# Patient Record
Sex: Male | Born: 1950 | Race: White | Hispanic: No | Marital: Married | Smoking: Never smoker
Health system: Southern US, Community
[De-identification: ages and names within clinical notes are randomized; demographics above are authoritative.]

## PROBLEM LIST (undated history)

## (undated) DIAGNOSIS — R2242 Localized swelling, mass and lump, left lower limb: Secondary | ICD-10-CM

## (undated) DIAGNOSIS — N529 Male erectile dysfunction, unspecified: Secondary | ICD-10-CM

## (undated) DIAGNOSIS — Z87442 Personal history of urinary calculi: Secondary | ICD-10-CM

## (undated) DIAGNOSIS — I82409 Acute embolism and thrombosis of unspecified deep veins of unspecified lower extremity: Secondary | ICD-10-CM

## (undated) DIAGNOSIS — J302 Other seasonal allergic rhinitis: Secondary | ICD-10-CM

## (undated) DIAGNOSIS — T4145XA Adverse effect of unspecified anesthetic, initial encounter: Secondary | ICD-10-CM

## (undated) DIAGNOSIS — M1990 Unspecified osteoarthritis, unspecified site: Secondary | ICD-10-CM

## (undated) DIAGNOSIS — J189 Pneumonia, unspecified organism: Secondary | ICD-10-CM

## (undated) DIAGNOSIS — Z9889 Other specified postprocedural states: Secondary | ICD-10-CM

## (undated) DIAGNOSIS — Z860100 Personal history of colon polyps, unspecified: Secondary | ICD-10-CM

## (undated) DIAGNOSIS — T8859XA Other complications of anesthesia, initial encounter: Secondary | ICD-10-CM

## (undated) DIAGNOSIS — K648 Other hemorrhoids: Secondary | ICD-10-CM

## (undated) DIAGNOSIS — D126 Benign neoplasm of colon, unspecified: Secondary | ICD-10-CM

## (undated) DIAGNOSIS — R112 Nausea with vomiting, unspecified: Secondary | ICD-10-CM

## (undated) DIAGNOSIS — N400 Enlarged prostate without lower urinary tract symptoms: Secondary | ICD-10-CM

## (undated) HISTORY — DX: Male erectile dysfunction, unspecified: N52.9

## (undated) HISTORY — DX: Personal history of colonic polyps: Z86.010

## (undated) HISTORY — DX: Benign neoplasm of colon, unspecified: D12.6

## (undated) HISTORY — PX: EXCISIONAL HEMORRHOIDECTOMY: SHX1541

## (undated) HISTORY — PX: JOINT REPLACEMENT: SHX530

## (undated) HISTORY — PX: KIDNEY STONE SURGERY: SHX686

## (undated) HISTORY — PX: SHOULDER ARTHROSCOPY: SHX128

## (undated) HISTORY — DX: Personal history of colon polyps, unspecified: Z86.0100

## (undated) HISTORY — PX: KNEE ARTHROSCOPY: SUR90

## (undated) HISTORY — DX: Other hemorrhoids: K64.8

## (undated) HISTORY — PX: COLONOSCOPY: SHX174

## (undated) HISTORY — DX: Benign prostatic hyperplasia without lower urinary tract symptoms: N40.0

---

## 2018-03-08 ENCOUNTER — Other Ambulatory Visit (HOSPITAL_COMMUNITY): Payer: Self-pay | Admitting: Orthopedic Surgery

## 2018-03-08 ENCOUNTER — Other Ambulatory Visit: Payer: Self-pay

## 2018-03-08 ENCOUNTER — Encounter (HOSPITAL_BASED_OUTPATIENT_CLINIC_OR_DEPARTMENT_OTHER): Payer: Self-pay | Admitting: *Deleted

## 2018-03-08 NOTE — Progress Notes (Signed)
Patient takes Xarelto for hx bilateral DVT's. States his LD was today 03-08-18 for upcoming surgery on 03-11-18 with Dr Damien Fusi.

## 2018-03-11 ENCOUNTER — Ambulatory Visit (HOSPITAL_BASED_OUTPATIENT_CLINIC_OR_DEPARTMENT_OTHER)
Admission: RE | Admit: 2018-03-11 | Discharge: 2018-03-11 | Disposition: A | Discharge status: Home / Self Care | Payer: Medicare Other | Source: Ambulatory Visit | Attending: Orthopedic Surgery | Admitting: Orthopedic Surgery

## 2018-03-11 ENCOUNTER — Encounter (HOSPITAL_BASED_OUTPATIENT_CLINIC_OR_DEPARTMENT_OTHER)
Admission: RE | Disposition: A | Discharge status: Home / Self Care | Payer: Self-pay | Source: Ambulatory Visit | Attending: Orthopedic Surgery

## 2018-03-11 ENCOUNTER — Other Ambulatory Visit: Payer: Self-pay

## 2018-03-11 ENCOUNTER — Ambulatory Visit (HOSPITAL_BASED_OUTPATIENT_CLINIC_OR_DEPARTMENT_OTHER): Payer: Medicare Other | Admitting: Certified Registered"

## 2018-03-11 ENCOUNTER — Encounter (HOSPITAL_BASED_OUTPATIENT_CLINIC_OR_DEPARTMENT_OTHER): Payer: Self-pay | Admitting: *Deleted

## 2018-03-11 DIAGNOSIS — D759 Disease of blood and blood-forming organs, unspecified: Secondary | ICD-10-CM | POA: Diagnosis not present

## 2018-03-11 DIAGNOSIS — R2242 Localized swelling, mass and lump, left lower limb: Secondary | ICD-10-CM | POA: Diagnosis present

## 2018-03-11 DIAGNOSIS — M7989 Other specified soft tissue disorders: Secondary | ICD-10-CM

## 2018-03-11 DIAGNOSIS — Z96652 Presence of left artificial knee joint: Secondary | ICD-10-CM | POA: Diagnosis not present

## 2018-03-11 DIAGNOSIS — L905 Scar conditions and fibrosis of skin: Secondary | ICD-10-CM | POA: Insufficient documentation

## 2018-03-11 DIAGNOSIS — Z86718 Personal history of other venous thrombosis and embolism: Secondary | ICD-10-CM | POA: Diagnosis not present

## 2018-03-11 DIAGNOSIS — Z7901 Long term (current) use of anticoagulants: Secondary | ICD-10-CM | POA: Diagnosis not present

## 2018-03-11 DIAGNOSIS — M1990 Unspecified osteoarthritis, unspecified site: Secondary | ICD-10-CM | POA: Diagnosis not present

## 2018-03-11 DIAGNOSIS — Z87442 Personal history of urinary calculi: Secondary | ICD-10-CM | POA: Diagnosis not present

## 2018-03-11 DIAGNOSIS — L570 Actinic keratosis: Secondary | ICD-10-CM | POA: Diagnosis not present

## 2018-03-11 DIAGNOSIS — Z79899 Other long term (current) drug therapy: Secondary | ICD-10-CM | POA: Insufficient documentation

## 2018-03-11 HISTORY — DX: Other complications of anesthesia, initial encounter: T88.59XA

## 2018-03-11 HISTORY — PX: MASS EXCISION: SHX2000

## 2018-03-11 HISTORY — DX: Unspecified osteoarthritis, unspecified site: M19.90

## 2018-03-11 HISTORY — DX: Other seasonal allergic rhinitis: J30.2

## 2018-03-11 HISTORY — DX: Nausea with vomiting, unspecified: R11.2

## 2018-03-11 HISTORY — DX: Other specified postprocedural states: Z98.890

## 2018-03-11 HISTORY — DX: Localized swelling, mass and lump, left lower limb: R22.42

## 2018-03-11 HISTORY — DX: Personal history of urinary calculi: Z87.442

## 2018-03-11 HISTORY — DX: Adverse effect of unspecified anesthetic, initial encounter: T41.45XA

## 2018-03-11 HISTORY — DX: Acute embolism and thrombosis of unspecified deep veins of unspecified lower extremity: I82.409

## 2018-03-11 SURGERY — EXCISION MASS
Anesthesia: Monitor Anesthesia Care | Site: Foot | Laterality: Left

## 2018-03-11 MED ORDER — ONDANSETRON HCL 4 MG/2ML IJ SOLN
INTRAMUSCULAR | Status: AC
  Filled 2018-03-11: qty 16

## 2018-03-11 MED ORDER — MIDAZOLAM HCL 2 MG/2ML IJ SOLN: 1.0000 mg | INTRAMUSCULAR | Status: DC | PRN

## 2018-03-11 MED ORDER — EPHEDRINE 5 MG/ML INJ
INTRAVENOUS | Status: AC
  Filled 2018-03-11: qty 10

## 2018-03-11 MED ORDER — PROPOFOL 10 MG/ML IV BOLUS
INTRAVENOUS | Status: AC
  Filled 2018-03-11: qty 20

## 2018-03-11 MED ORDER — FENTANYL CITRATE (PF) 100 MCG/2ML IJ SOLN
INTRAMUSCULAR | Status: AC
  Filled 2018-03-11: qty 2

## 2018-03-11 MED ORDER — CHLORHEXIDINE GLUCONATE 4 % EX LIQD: 60.0000 mL | Freq: Once | CUTANEOUS | Status: DC

## 2018-03-11 MED ORDER — FENTANYL CITRATE (PF) 100 MCG/2ML IJ SOLN: 25.0000 ug | INTRAMUSCULAR | Status: DC | PRN

## 2018-03-11 MED ORDER — CEFAZOLIN SODIUM-DEXTROSE 2-4 GM/100ML-% IV SOLN
INTRAVENOUS | Status: AC
  Filled 2018-03-11: qty 100

## 2018-03-11 MED ORDER — DEXAMETHASONE SODIUM PHOSPHATE 10 MG/ML IJ SOLN
INTRAMUSCULAR | Status: AC
  Filled 2018-03-11: qty 4

## 2018-03-11 MED ORDER — BUPIVACAINE-EPINEPHRINE 0.5% -1:200000 IJ SOLN
INTRAMUSCULAR | Status: DC | PRN
  Administered 2018-03-11: 7 mL

## 2018-03-11 MED ORDER — SCOPOLAMINE 1 MG/3DAYS TD PT72: 1.0000 | MEDICATED_PATCH | Freq: Once | TRANSDERMAL | Status: DC | PRN

## 2018-03-11 MED ORDER — CEFAZOLIN SODIUM-DEXTROSE 2-4 GM/100ML-% IV SOLN
2.0000 g | INTRAVENOUS | Status: AC
  Administered 2018-03-11: 2 g via INTRAVENOUS

## 2018-03-11 MED ORDER — LIDOCAINE HCL (CARDIAC) PF 100 MG/5ML IV SOSY
PREFILLED_SYRINGE | INTRAVENOUS | Status: DC | PRN
  Administered 2018-03-11: 30 mg via INTRAVENOUS

## 2018-03-11 MED ORDER — PROPOFOL 500 MG/50ML IV EMUL
INTRAVENOUS | Status: DC | PRN
  Administered 2018-03-11: 75 ug/kg/min via INTRAVENOUS

## 2018-03-11 MED ORDER — DEXMEDETOMIDINE HCL IN NACL 200 MCG/50ML IV SOLN
INTRAVENOUS | Status: AC
  Filled 2018-03-11: qty 50

## 2018-03-11 MED ORDER — ONDANSETRON HCL 4 MG/2ML IJ SOLN: 4.0000 mg | Freq: Once | INTRAMUSCULAR | Status: DC | PRN

## 2018-03-11 MED ORDER — ONDANSETRON HCL 4 MG/2ML IJ SOLN
INTRAMUSCULAR | Status: DC | PRN
  Administered 2018-03-11: 4 mg via INTRAVENOUS

## 2018-03-11 MED ORDER — LACTATED RINGERS IV SOLN
INTRAVENOUS | Status: DC
  Administered 2018-03-11: 10:00:00 via INTRAVENOUS

## 2018-03-11 MED ORDER — PROPOFOL 500 MG/50ML IV EMUL
INTRAVENOUS | Status: AC
  Filled 2018-03-11: qty 150

## 2018-03-11 MED ORDER — FENTANYL CITRATE (PF) 100 MCG/2ML IJ SOLN
50.0000 ug | INTRAMUSCULAR | Status: DC | PRN
  Administered 2018-03-11: 50 ug via INTRAVENOUS

## 2018-03-11 MED ORDER — SODIUM CHLORIDE 0.9 % IV SOLN: INTRAVENOUS | Status: DC

## 2018-03-11 MED ORDER — LIDOCAINE 2% (20 MG/ML) 5 ML SYRINGE
INTRAMUSCULAR | Status: AC
  Filled 2018-03-11: qty 20

## 2018-03-11 SURGICAL SUPPLY — 69 items
BANDAGE ACE 4X5 VEL STRL LF (GAUZE/BANDAGES/DRESSINGS) IMPLANT
BANDAGE ESMARK 6X9 LF (GAUZE/BANDAGES/DRESSINGS) IMPLANT
BLADE ARTHRO LOK 4 BEAVER (BLADE) IMPLANT
BLADE ARTHRO LOK 4MM BEAVER (BLADE)
BLADE SURG 15 STRL LF DISP TIS (BLADE) ×1 IMPLANT
BLADE SURG 15 STRL SS (BLADE) ×2
BNDG COHESIVE 4X5 TAN STRL (GAUZE/BANDAGES/DRESSINGS) IMPLANT
BNDG COHESIVE 6X5 TAN STRL LF (GAUZE/BANDAGES/DRESSINGS) IMPLANT
BNDG CONFORM 3 STRL LF (GAUZE/BANDAGES/DRESSINGS) IMPLANT
BNDG ESMARK 4X9 LF (GAUZE/BANDAGES/DRESSINGS) ×3 IMPLANT
BNDG ESMARK 6X9 LF (GAUZE/BANDAGES/DRESSINGS)
CHLORAPREP W/TINT 26ML (MISCELLANEOUS) ×3 IMPLANT
CLOSURE WOUND 1/2 X4 (GAUZE/BANDAGES/DRESSINGS)
CORD BIPOLAR FORCEPS 12FT (ELECTRODE) IMPLANT
COVER BACK TABLE 60X90IN (DRAPES) ×3 IMPLANT
CUFF TOURNIQUET SINGLE 24IN (TOURNIQUET CUFF) IMPLANT
CUFF TOURNIQUET SINGLE 34IN LL (TOURNIQUET CUFF) IMPLANT
DRAPE EXTREMITY T 121X128X90 (DRAPE) ×3 IMPLANT
DRAPE OEC MINIVIEW 54X84 (DRAPES) IMPLANT
DRAPE SURG 17X23 STRL (DRAPES) IMPLANT
DRAPE U-SHAPE 47X51 STRL (DRAPES) IMPLANT
DRSG MEPITEL 4X7.2 (GAUZE/BANDAGES/DRESSINGS) ×3 IMPLANT
DRSG PAD ABDOMINAL 8X10 ST (GAUZE/BANDAGES/DRESSINGS) ×3 IMPLANT
ELECT REM PT RETURN 9FT ADLT (ELECTROSURGICAL) ×3
ELECTRODE REM PT RTRN 9FT ADLT (ELECTROSURGICAL) ×1 IMPLANT
GAUZE SPONGE 4X4 12PLY STRL (GAUZE/BANDAGES/DRESSINGS) ×3 IMPLANT
GLOVE BIO SURGEON STRL SZ 6.5 (GLOVE) ×2 IMPLANT
GLOVE BIO SURGEON STRL SZ8 (GLOVE) ×3 IMPLANT
GLOVE BIO SURGEONS STRL SZ 6.5 (GLOVE) ×1
GLOVE BIOGEL PI IND STRL 7.0 (GLOVE) ×2 IMPLANT
GLOVE BIOGEL PI IND STRL 8 (GLOVE) ×2 IMPLANT
GLOVE BIOGEL PI INDICATOR 7.0 (GLOVE) ×4
GLOVE BIOGEL PI INDICATOR 8 (GLOVE) ×4
GLOVE ECLIPSE 8.0 STRL XLNG CF (GLOVE) ×3 IMPLANT
GOWN STRL REUS W/ TWL LRG LVL3 (GOWN DISPOSABLE) ×1 IMPLANT
GOWN STRL REUS W/ TWL XL LVL3 (GOWN DISPOSABLE) ×2 IMPLANT
GOWN STRL REUS W/TWL LRG LVL3 (GOWN DISPOSABLE) ×2
GOWN STRL REUS W/TWL XL LVL3 (GOWN DISPOSABLE) ×4
NEEDLE HYPO 22GX1.5 SAFETY (NEEDLE) ×3 IMPLANT
NEEDLE HYPO 25X1 1.5 SAFETY (NEEDLE) IMPLANT
NS IRRIG 1000ML POUR BTL (IV SOLUTION) ×3 IMPLANT
PACK BASIN DAY SURGERY FS (CUSTOM PROCEDURE TRAY) ×3 IMPLANT
PAD CAST 4YDX4 CTTN HI CHSV (CAST SUPPLIES) ×1 IMPLANT
PADDING CAST ABS 4INX4YD NS (CAST SUPPLIES)
PADDING CAST ABS COTTON 4X4 ST (CAST SUPPLIES) IMPLANT
PADDING CAST COTTON 4X4 STRL (CAST SUPPLIES) ×2
PADDING CAST COTTON 6X4 STRL (CAST SUPPLIES) IMPLANT
PENCIL BUTTON HOLSTER BLD 10FT (ELECTRODE) ×3 IMPLANT
SANITIZER HAND PURELL 535ML FO (MISCELLANEOUS) ×3 IMPLANT
SHEET MEDIUM DRAPE 40X70 STRL (DRAPES) ×3 IMPLANT
SLEEVE SCD COMPRESS KNEE MED (MISCELLANEOUS) IMPLANT
SPONGE LAP 18X18 RF (DISPOSABLE) ×3 IMPLANT
STOCKINETTE 6  STRL (DRAPES) ×2
STOCKINETTE 6 STRL (DRAPES) ×1 IMPLANT
STRIP CLOSURE SKIN 1/2X4 (GAUZE/BANDAGES/DRESSINGS) IMPLANT
SUCTION FRAZIER HANDLE 10FR (MISCELLANEOUS)
SUCTION TUBE FRAZIER 10FR DISP (MISCELLANEOUS) IMPLANT
SUT ETHILON 3 0 PS 1 (SUTURE) ×3 IMPLANT
SUT MNCRL AB 3-0 PS2 18 (SUTURE) ×3 IMPLANT
SUT VIC AB 0 SH 27 (SUTURE) IMPLANT
SUT VIC AB 2-0 SH 27 (SUTURE)
SUT VIC AB 2-0 SH 27XBRD (SUTURE) IMPLANT
SYR BULB 3OZ (MISCELLANEOUS) ×3 IMPLANT
SYR CONTROL 10ML LL (SYRINGE) ×3 IMPLANT
TOWEL GREEN STERILE FF (TOWEL DISPOSABLE) ×3 IMPLANT
TUBE CONNECTING 20'X1/4 (TUBING)
TUBE CONNECTING 20X1/4 (TUBING) IMPLANT
UNDERPAD 30X30 (UNDERPADS AND DIAPERS) ×3 IMPLANT
YANKAUER SUCT BULB TIP NO VENT (SUCTIONS) IMPLANT

## 2018-03-11 NOTE — Op Note (Signed)
03/11/2018  10:57 AM  PATIENT:  Nicholas Terry  67 y.o. male  PRE-OPERATIVE DIAGNOSIS:  Left plantar forefoot soft tissue mass  POST-OPERATIVE DIAGNOSIS:  Left plantar forefoot soft tissue mass  Procedure(s):  Excisional biopsy of left forefoot mass 1 cm x 1.5 cm  SURGEON:  Wylene Simmer, MD  ASSISTANT: Mechele Claude, PA-C  ANESTHESIA:   Local, mac  EBL:  minimal   TOURNIQUET:  approx 10 min with ankle esmarch  COMPLICATIONS:  None apparent  DISPOSITION:  Extubated, awake and stable to recovery.  INDICATION FOR PROCEDURE: The patient is a 67 year old male with a 20-year history of a painful left forefoot mass.  He recently saw his dermatologist who referred him to me for further evaluation and treatment of this lesion.  He presents now for excisional biopsy of this painful mass.  The risks and benefits of the alternative treatment options have been discussed in detail.  The patient wishes to proceed with surgery and specifically understands risks of bleeding, infection, nerve damage, blood clots, need for additional surgery, amputation and death.  PROCEDURE IN DETAIL:  After pre operative consent was obtained, and the correct operative site was identified, the patient was brought to the operating room and placed supine on the OR table.  Anesthesia was administered.  Pre-operative antibiotics were administered.  A surgical timeout was taken.  The left lower extremity was prepped and draped in standard sterile fashion.  Half percent Marcaine with epinephrine was infiltrated into the subcutaneous tissues around the mass.  The foot was exsanguinated and a 4 inch Esmarch tourniquet wrapped around the ankle.  The mass was identified at the plantar medial aspect of the left forefoot.  An ellipsoid incision was marked on the skin around the base of the mass.  Sharp dissection was carried down through the skin to the level of subcutaneous tissue.  Subtenons tissue was dissected free and the mass was  removed in its entirety.  It measured 1 cm x 1-1/2 cm.  It was sent as a specimen to pathology.  Wound was then irrigated copiously.  The tourniquet was released.  Hemostasis was achieved.  The incision was closed with horizontal mattress sutures of 3-0 nylon.  Sterile dressings were applied followed by a compression wrap.  Patient was awakened from anesthesia and transported to the recovery room in stable condition.   FOLLOW UP PLAN: The patient will be weightbearing as tolerated on the left lower extremity in a postop shoe.  Follow-up with me in the office in 2 weeks for suture removal and pathology results.    Mechele Claude PA-C was present and scrubbed for the duration of the operative case. His assistance was essential in positioning the patient, prepping and draping, gaining and maintaining exposure, performing the operation, closing and dressing the wounds and applying the splint.

## 2018-03-11 NOTE — Anesthesia Preprocedure Evaluation (Addendum)
Anesthesia Evaluation  Patient identified by MRN, date of birth, ID band Patient awake    Reviewed: Allergy & Precautions, NPO status , Patient's Chart, lab work & pertinent test results  History of Anesthesia Complications (+) PONV and history of anesthetic complications  Airway Mallampati: II  TM Distance: >3 FB Neck ROM: Full    Dental  (+) Teeth Intact, Dental Advisory Given   Pulmonary neg pulmonary ROS,    Pulmonary exam normal breath sounds clear to auscultation       Cardiovascular + DVT  Normal cardiovascular exam Rhythm:Regular Rate:Normal     Neuro/Psych negative neurological ROS  negative psych ROS   GI/Hepatic negative GI ROS, Neg liver ROS,   Endo/Other  negative endocrine ROS  Renal/GU negative Renal ROS     Musculoskeletal  (+) Arthritis , Left plantar forefoot soft tissue mass   Abdominal   Peds  Hematology  (+) Blood dyscrasia (Xarelto), ,   Anesthesia Other Findings Day of surgery medications reviewed with the patient.  Reproductive/Obstetrics                            Anesthesia Physical Anesthesia Plan  ASA: II  Anesthesia Plan: MAC   Post-op Pain Management:    Induction: Intravenous  PONV Risk Score and Plan: 2 and Propofol infusion, Ondansetron and Dexamethasone  Airway Management Planned: Nasal Cannula  Additional Equipment:   Intra-op Plan:   Post-operative Plan:   Informed Consent: I have reviewed the patients History and Physical, chart, labs and discussed the procedure including the risks, benefits and alternatives for the proposed anesthesia with the patient or authorized representative who has indicated his/her understanding and acceptance.   Dental advisory given  Plan Discussed with: CRNA and Anesthesiologist  Anesthesia Plan Comments: (Discussed risks/benefits/alternatives to MAC sedation including need for ventilatory support,  hypotension, need for conversion to general anesthesia.  All patient questions answered.  Patient/guardian wishes to proceed.)       Anesthesia Quick Evaluation

## 2018-03-11 NOTE — Anesthesia Postprocedure Evaluation (Signed)
Anesthesia Post Note  [name]  Procedure(s) Performed: Excisional biopsy of left forefoot mass (Left Foot)     Patient location during evaluation: PACU Anesthesia Type: MAC Level of consciousness: awake and alert, oriented and awake Pain management: pain level controlled Vital Signs Assessment: post-procedure vital signs reviewed and stable Respiratory status: spontaneous breathing, nonlabored ventilation and respiratory function stable Cardiovascular status: stable and blood pressure returned to baseline Postop Assessment: no apparent nausea or vomiting Anesthetic complications: no Comments: MAC procedure, and no patient complaint of nausea/vomiting.  Given Zofran IV and propofol infusion intraop.     Last Vitals:  Vitals:   03/11/18 1115 03/11/18 1206  BP: 111/84 124/76  Pulse: (!) 52 (!) 59  Resp: 12   Temp:  (!) 36.2 C  SpO2: 98% 98%    Last Pain:  Vitals:   03/11/18 1206  TempSrc: Oral  PainSc: 0-No pain                 Catalina Gravel

## 2018-03-11 NOTE — Transfer of Care (Signed)
Immediate Anesthesia Transfer of Care Note  [name]  Procedure(s) Performed: Excisional biopsy of left forefoot mass (Left Foot)  Patient Location: PACU  Anesthesia Type:MAC  Level of Consciousness: awake, alert , oriented and patient cooperative  Airway & Oxygen Therapy: Patient Spontanous Breathing and Patient connected to face mask oxygen  Post-op Assessment: Report given to RN and Post -op Vital signs reviewed and stable  Post vital signs: Reviewed and stable  Last Vitals:  Vitals Value Taken Time  BP    Temp    Pulse 59 03/11/2018 10:58 AM  Resp 9 03/11/2018 10:58 AM  SpO2 100 % 03/11/2018 10:58 AM  Vitals shown include unvalidated device data.  Last Pain:  Vitals:   03/11/18 0939  TempSrc: Oral  PainSc: 2       Patients Stated Pain Goal: 2 (70/01/74 9449)  Complications: No apparent anesthesia complications

## 2018-03-11 NOTE — Discharge Instructions (Addendum)
Wylene Simmer, MD [place]  Please read the following information regarding your care after surgery.  Medications  You only need a prescription for the narcotic pain medicine (ex. oxycodone, Percocet, Norco).  All of the other medicines listed below are available over the counter. X Aleve 2 pills twice a day for the first 3 days after surgery. X acetominophen (Tylenol) 650 mg every 4-6 hours as you need for minor to moderate pain  Weight Bearing X Bear weight when you are able on your operated leg or foot.  Cast / Splint / Dressing X Remove your dressing 3 days after surgery and cover the incisions with dry dressings.    After your dressing, cast or splint is removed; you may shower, but do not soak or scrub the wound.  Allow the water to run over it, and then gently pat it dry.  Swelling It is normal for you to have swelling where you had surgery.  To reduce swelling and pain, keep your toes above your nose for at least 3 days after surgery.  It may be necessary to keep your foot or leg elevated for several weeks.  If it hurts, it should be elevated.  Follow Up Call my office at [phone] when you are discharged from the hospital or surgery center to schedule an appointment to be seen two weeks after surgery.  Call my office at [phone] if you develop a fever >101.5 F, nausea, vomiting, bleeding from the surgical site or severe pain.     Post Anesthesia Home Care Instructions  Activity: Get plenty of rest for the remainder of the day. A responsible individual must stay with you for 24 hours following the procedure.  For the next 24 hours, DO NOT: -Drive a car -Paediatric nurse -Drink alcoholic beverages -Take any medication unless instructed by your physician -Make any legal decisions or sign important papers.  Meals: Start with liquid foods such as gelatin or soup. Progress to regular foods as tolerated. Avoid greasy, spicy, heavy foods. If nausea and/or  vomiting occur, drink only clear liquids until the nausea and/or vomiting subsides. Call your physician if vomiting continues.  Special Instructions/Symptoms: Your throat may feel dry or sore from the anesthesia or the breathing tube placed in your throat during surgery. If this causes discomfort, gargle with warm salt water. The discomfort should disappear within 24 hours.  If you had a scopolamine patch placed behind your ear for the management of post- operative nausea and/or vomiting:  1. The medication in the patch is effective for 72 hours, after which it should be removed.  Wrap patch in a tissue and discard in the trash. Wash hands thoroughly with soap and water. 2. You may remove the patch earlier than 72 hours if you experience unpleasant side effects which may include dry mouth, dizziness or visual disturbances. 3. Avoid touching the patch. Wash your hands with soap and water after contact with the patch.

## 2018-03-11 NOTE — H&P (Signed)
Nicholas Terry is an 67 y.o. male.   Chief Complaint: left foot pain HPI: The patient is a 67 year old male without significant past medical history.  He complains of a painful lesion on the plantar aspect of his left forefoot for over 20 years.  He saw his dermatologist recently who referred him to me for evaluation and treatment of this lesion.  He denies any history of cancer or any other significant skin abnormalities.  He presents today for excisional biopsy of this lesion.  Past Medical History:  Diagnosis Date  . Arthritis   . Complication of anesthesia   . DVT (deep venous thrombosis) (HCC)    bil calf  . Foot mass, left    left plantar forefoot  . History of kidney stones   . PONV (postoperative nausea and vomiting)   . Seasonal allergies     Past Surgical History:  Procedure Laterality Date  . JOINT REPLACEMENT Left    knee  . KIDNEY STONE SURGERY     x3  . KNEE ARTHROSCOPY Bilateral   . SHOULDER ARTHROSCOPY Right     History reviewed. No pertinent family history. Social History:  reports that he has never smoked. He has never used smokeless tobacco. He reports that he drinks alcohol. He reports that he does not use drugs.  Allergies: No Known Allergies  Medications Prior to Admission  Medication Sig Dispense Refill  . acetaminophen (TYLENOL) 500 MG tablet Take 500 mg by mouth every 6 (six) hours as needed.    . Lido-Menthol-Methyl Sal-Camph (CBD KINGS EX) Apply topically.    Marland Kitchen loratadine (CLARITIN) 10 MG tablet Take 10 mg by mouth daily.    . Multiple Vitamin (MULTIVITAMIN WITH MINERALS) TABS tablet Take 1 tablet by mouth daily.    . Rivaroxaban (XARELTO) 15 MG TABS tablet Take 15 mg by mouth 2 (two) times daily with a meal.    . Saw Palmetto 500 MG CAPS Take by mouth.    . traMADol (ULTRAM) 50 MG tablet Take by mouth every 6 (six) hours as needed.      No results found for this or any previous visit (from the past 48 hour(s)). No results found.  ROS no recent  fever, chills, nausea, vomiting or changes in his appetite  Blood pressure 113/75, pulse (!) 58, temperature 98.1 F (36.7 C), temperature source Oral, resp. rate 18, height 5\' 9"  (1.753 m), weight 61.2 kg, SpO2 98 %. Physical Exam  Well-nourished well-developed man in no apparent distress.  Alert and oriented x4.  Mood and affect are normal.  Extraocular motions are intact.  Respirations are unlabored.  Gait is normal.  The left foot has a 1 mm skin tag at the plantar medial forefoot adjacent to the medial sesamoid.  There is a keratotic head to this lesion.  It is sessile with a broad base of approximately 1 cm.  It is nontender to palpation.  There is no adjacent mass in the subcutaneous tissue.  Dorsalis pedis and posterior tibial pulses are 2+.  No lymphadenopathy.  Skin is otherwise healthy and intact.  5 out of 5 strength in plantar flexion and dorsiflexion of the toes.  Assessment/Plan Left plantar forefoot mass -to the operating room today for excisional biopsy.  The risks and benefits of the alternative treatment options have been discussed in detail.  The patient wishes to proceed with surgery and specifically understands risks of bleeding, infection, nerve damage, blood clots, need for additional surgery, amputation and death.   Nicholas Terry,  Nicholas Reichmann, MD 03/11/2018, 10:14 AM

## 2018-03-11 NOTE — Anesthesia Procedure Notes (Signed)
Procedure Name: MAC Date/Time: 03/11/2018 10:45 AM Performed by: Signe Colt, CRNA Pre-anesthesia Checklist: Patient identified, Emergency Drugs available, Suction available, Patient being monitored and Timeout performed Patient Re-evaluated:Patient Re-evaluated prior to induction Oxygen Delivery Method: Simple face mask

## 2018-03-12 ENCOUNTER — Encounter (HOSPITAL_BASED_OUTPATIENT_CLINIC_OR_DEPARTMENT_OTHER): Payer: Self-pay | Admitting: Orthopedic Surgery

## 2018-05-18 ENCOUNTER — Other Ambulatory Visit (HOSPITAL_COMMUNITY): Payer: Medicare Other

## 2018-05-18 NOTE — Pre-Procedure Instructions (Signed)
Nicholas Terry  05/18/2018      [id number] [address], [address] AT [facility] [address] [place] [place] [address] Phone: [phone] Fax: [phone]    Your procedure is scheduled on Thursday November 7th.  Report to [facility] Admitting at 7:30 A.M.  Call this number if you have problems the morning of surgery:  [phone]   Remember:  Do not eat or drink after midnight.      Take these medicines the morning of surgery with A SIP OF WATER- IF NEEDED acetaminophen (TYLENOL) loratadine (CLARITIN) traMADol (ULTRAM) Refresh eye drops   7 days prior to surgery STOP taking any Aspirin(unless otherwise instructed by your surgeon), Aleve, Naproxen, Ibuprofen, Motrin, Advil, Goody's, BC's, all herbal medications, fish oil, and all vitamins    Do not wear jewelry, make-up or nail polish.  Do not wear lotions, powders, or perfumes, or deodorant.  Do not shave 48 hours prior to surgery.  Men may shave face and neck.  Do not bring valuables to the hospital.  [facility] is not responsible for any belongings or valuables.  Contacts, dentures or bridgework may not be worn into surgery.  Leave your suitcase in the car.  After surgery it may be brought to your room.  For patients admitted to the hospital, discharge time will be determined by your treatment team.  Patients discharged the day of surgery will not be allowed to drive home.   [place]- Preparing For Surgery  Before surgery, you can play an important role. Because skin is not sterile, your skin needs to be as free of germs as possible. You can reduce the number of germs on your skin by washing with CHG (chlorahexidine gluconate) Soap before surgery.  CHG is an antiseptic cleaner which kills germs and bonds with the skin to continue killing germs even after washing.    Oral Hygiene is also important to reduce your risk of infection.  Remember - BRUSH  YOUR TEETH THE MORNING OF SURGERY WITH YOUR REGULAR TOOTHPASTE  Please do not use if you have an allergy to CHG or antibacterial soaps. If your skin becomes reddened/irritated stop using the CHG.  Do not shave (including legs and underarms) for at least 48 hours prior to first CHG shower. It is OK to shave your face.  Please follow these instructions carefully.   1. Shower the NIGHT BEFORE SURGERY and the MORNING OF SURGERY with CHG.   2. If you chose to wash your hair, wash your hair first as usual with your normal shampoo.  3. After you shampoo, rinse your hair and body thoroughly to remove the shampoo.  4. Use CHG as you would any other liquid soap. You can apply CHG directly to the skin and wash gently with a scrungie or a clean washcloth.   5. Apply the CHG Soap to your body ONLY FROM THE NECK DOWN.  Do not use on open wounds or open sores. Avoid contact with your eyes, ears, mouth and genitals (private parts). Wash Face and genitals (private parts)  with your normal soap.  6. Wash thoroughly, paying special attention to the area where your surgery will be performed.  7. Thoroughly rinse your body with warm water from the neck down.  8. DO NOT shower/wash with your normal soap after using and rinsing off the CHG Soap.  9. Pat yourself dry with a CLEAN TOWEL.  10.  Wear CLEAN PAJAMAS to bed the night before surgery, wear comfortable clothes the morning of surgery  11. Place CLEAN SHEETS on your bed the night of your first shower and DO NOT SLEEP WITH PETS.    Day of Surgery: Shower as stated above. Do not apply any deodorants/lotions.  Please wear clean clothes to the hospital/surgery center.   Remember to brush your teeth WITH YOUR REGULAR TOOTHPASTE.   Please read over the following fact sheets that you were given.

## 2018-05-19 ENCOUNTER — Other Ambulatory Visit: Payer: Self-pay

## 2018-05-19 ENCOUNTER — Encounter (HOSPITAL_COMMUNITY)
Admission: RE | Admit: 2018-05-19 | Discharge: 2018-05-19 | Disposition: A | Discharge status: Home / Self Care | Payer: Medicare Other | Source: Ambulatory Visit | Attending: Orthopedic Surgery | Admitting: Orthopedic Surgery

## 2018-05-19 ENCOUNTER — Encounter (HOSPITAL_COMMUNITY): Payer: Self-pay

## 2018-05-19 DIAGNOSIS — Z86718 Personal history of other venous thrombosis and embolism: Secondary | ICD-10-CM | POA: Insufficient documentation

## 2018-05-19 DIAGNOSIS — R2242 Localized swelling, mass and lump, left lower limb: Secondary | ICD-10-CM | POA: Diagnosis not present

## 2018-05-19 DIAGNOSIS — Z01812 Encounter for preprocedural laboratory examination: Secondary | ICD-10-CM | POA: Insufficient documentation

## 2018-05-19 LAB — SURGICAL PCR SCREEN
MRSA, PCR: NEGATIVE
Staphylococcus aureus: NEGATIVE

## 2018-05-19 LAB — CBC
HCT: 42.3 % (ref 39.0–52.0)
Hemoglobin: 13.8 g/dL (ref 13.0–17.0)
MCH: 32.1 pg (ref 26.0–34.0)
MCHC: 32.6 g/dL (ref 30.0–36.0)
MCV: 98.4 fL (ref 80.0–100.0)
NRBC: 0 % (ref 0.0–0.2)
Platelets: 205 10*3/uL (ref 150–400)
RBC: 4.3 MIL/uL (ref 4.22–5.81)
RDW: 12.9 % (ref 11.5–15.5)
WBC: 5.4 10*3/uL (ref 4.0–10.5)

## 2018-05-19 LAB — BASIC METABOLIC PANEL
ANION GAP: 6 (ref 5–15)
BUN: 16 mg/dL (ref 8–23)
CALCIUM: 9.2 mg/dL (ref 8.9–10.3)
CO2: 26 mmol/L (ref 22–32)
Chloride: 105 mmol/L (ref 98–111)
Creatinine, Ser: 0.89 mg/dL (ref 0.61–1.24)
GFR calc Af Amer: >60 mL/min (ref >60)
GFR calc non Af Amer: >60 mL/min (ref >60)
GLUCOSE: 106 mg/dL — AB (ref 70–99)
Potassium: 4 mmol/L (ref 3.5–5.1)
Sodium: 137 mmol/L (ref 135–145)

## 2018-05-19 NOTE — Progress Notes (Signed)
PCP - Candis Schatz Cardiologist - denies cardiac workup  Blood Thinner Instructions: last dose Xarelto 05/21/18 (5 days prior to surgery)  Patient denies shortness of breath, fever, cough and chest pain at PAT appointment   Patient verbalized understanding of instructions that were given to them at the PAT appointment. Patient was also instructed that they will need to review over the PAT instructions again at home before surgery.

## 2018-05-26 MED ORDER — TRANEXAMIC ACID-NACL 1000-0.7 MG/100ML-% IV SOLN
1000.0000 mg | INTRAVENOUS | Status: AC
  Administered 2018-05-27: 1000 mg via INTRAVENOUS
  Filled 2018-05-26: qty 100

## 2018-05-26 NOTE — Anesthesia Preprocedure Evaluation (Addendum)
Anesthesia Evaluation  Patient identified by MRN, date of birth, ID band Patient awake    Reviewed: Allergy & Precautions, NPO status , Patient's Chart, lab work & pertinent test results  History of Anesthesia Complications (+) PONV and history of anesthetic complications  Airway Mallampati: I  TM Distance: >3 FB Neck ROM: Full    Dental no notable dental hx. (+) Teeth Intact, Dental Advisory Given   Pulmonary neg pulmonary ROS,    Pulmonary exam normal breath sounds clear to auscultation       Cardiovascular + DVT  Normal cardiovascular exam Rhythm:Regular Rate:Normal     Neuro/Psych negative neurological ROS     GI/Hepatic negative GI ROS, Neg liver ROS,   Endo/Other  negative endocrine ROS  Renal/GU negative Renal ROS     Musculoskeletal negative musculoskeletal ROS (+) Arthritis ,   Abdominal   Peds  Hematology Hx of DVT, on Xarelto   Anesthesia Other Findings Day of surgery medications reviewed with the patient.  Reproductive/Obstetrics                            Anesthesia Physical Anesthesia Plan  ASA: II  Anesthesia Plan: General   Post-op Pain Management: GA combined w/ Regional for post-op pain   Induction:   PONV Risk Score and Plan: 3 and Ondansetron, Dexamethasone, Treatment may vary due to age or medical condition and Midazolam  Airway Management Planned: Oral ETT  Additional Equipment:   Intra-op Plan:   Post-operative Plan: Extubation in OR  Informed Consent: I have reviewed the patients History and Physical, chart, labs and discussed the procedure including the risks, benefits and alternatives for the proposed anesthesia with the patient or authorized representative who has indicated his/her understanding and acceptance.   Dental advisory given  Plan Discussed with: CRNA  Anesthesia Plan Comments:        Anesthesia Quick Evaluation

## 2018-05-27 ENCOUNTER — Inpatient Hospital Stay (HOSPITAL_COMMUNITY): Payer: Medicare Other | Admitting: Certified Registered Nurse Anesthetist

## 2018-05-27 ENCOUNTER — Other Ambulatory Visit: Payer: Self-pay

## 2018-05-27 ENCOUNTER — Inpatient Hospital Stay (HOSPITAL_COMMUNITY)
Admission: RE | Admit: 2018-05-27 | Discharge: 2018-05-28 | DRG: 483 | Disposition: A | Discharge status: Home / Self Care | Payer: Medicare Other | Attending: Orthopedic Surgery | Admitting: Orthopedic Surgery

## 2018-05-27 ENCOUNTER — Encounter (HOSPITAL_COMMUNITY): Payer: Self-pay

## 2018-05-27 ENCOUNTER — Encounter (HOSPITAL_COMMUNITY)
Admission: RE | Disposition: A | Discharge status: Home / Self Care | Payer: Self-pay | Source: Home / Self Care | Attending: Orthopedic Surgery

## 2018-05-27 DIAGNOSIS — Z86718 Personal history of other venous thrombosis and embolism: Secondary | ICD-10-CM | POA: Diagnosis not present

## 2018-05-27 DIAGNOSIS — J302 Other seasonal allergic rhinitis: Secondary | ICD-10-CM | POA: Diagnosis present

## 2018-05-27 DIAGNOSIS — Z96652 Presence of left artificial knee joint: Secondary | ICD-10-CM | POA: Diagnosis present

## 2018-05-27 DIAGNOSIS — M19012 Primary osteoarthritis, left shoulder: Secondary | ICD-10-CM | POA: Diagnosis present

## 2018-05-27 DIAGNOSIS — Z79899 Other long term (current) drug therapy: Secondary | ICD-10-CM

## 2018-05-27 DIAGNOSIS — Z87442 Personal history of urinary calculi: Secondary | ICD-10-CM | POA: Diagnosis not present

## 2018-05-27 DIAGNOSIS — Z7901 Long term (current) use of anticoagulants: Secondary | ICD-10-CM

## 2018-05-27 DIAGNOSIS — Z96612 Presence of left artificial shoulder joint: Secondary | ICD-10-CM

## 2018-05-27 HISTORY — PX: TOTAL SHOULDER ARTHROPLASTY: SHX126

## 2018-05-27 LAB — PROTIME-INR
INR: 0.95
Prothrombin Time: 12.6 seconds (ref 11.4–15.2)

## 2018-05-27 SURGERY — ARTHROPLASTY, SHOULDER, TOTAL
Anesthesia: General | Site: Shoulder | Laterality: Left

## 2018-05-27 MED ORDER — PHENOL 1.4 % MT LIQD: 1.0000 | OROMUCOSAL | Status: DC | PRN

## 2018-05-27 MED ORDER — SCOPOLAMINE 1 MG/3DAYS TD PT72
MEDICATED_PATCH | TRANSDERMAL | Status: AC
  Filled 2018-05-27: qty 1

## 2018-05-27 MED ORDER — FENTANYL CITRATE (PF) 100 MCG/2ML IJ SOLN
100.0000 ug | Freq: Once | INTRAMUSCULAR | Status: AC
  Administered 2018-05-27: 50 ug via INTRAVENOUS

## 2018-05-27 MED ORDER — OXYCODONE HCL 5 MG/5ML PO SOLN: 5.0000 mg | Freq: Once | ORAL | Status: DC | PRN

## 2018-05-27 MED ORDER — BUPIVACAINE LIPOSOME 1.3 % IJ SUSP
INTRAMUSCULAR | Status: DC | PRN
  Administered 2018-05-27: 10 mL via PERINEURAL

## 2018-05-27 MED ORDER — PROPOFOL 10 MG/ML IV BOLUS
INTRAVENOUS | Status: AC
  Filled 2018-05-27: qty 20

## 2018-05-27 MED ORDER — MIDAZOLAM HCL 2 MG/2ML IJ SOLN
INTRAMUSCULAR | Status: AC
  Administered 2018-05-27: 1 mg via INTRAVENOUS
  Filled 2018-05-27: qty 2

## 2018-05-27 MED ORDER — LIDOCAINE 2% (20 MG/ML) 5 ML SYRINGE
INTRAMUSCULAR | Status: DC | PRN
  Administered 2018-05-27: 100 mg via INTRAVENOUS

## 2018-05-27 MED ORDER — ACETAMINOPHEN 10 MG/ML IV SOLN: 1000.0000 mg | Freq: Once | INTRAVENOUS | Status: DC | PRN

## 2018-05-27 MED ORDER — PHENYLEPHRINE 40 MCG/ML (10ML) SYRINGE FOR IV PUSH (FOR BLOOD PRESSURE SUPPORT)
PREFILLED_SYRINGE | INTRAVENOUS | Status: AC
  Filled 2018-05-27: qty 10

## 2018-05-27 MED ORDER — LACTATED RINGERS IV SOLN
INTRAVENOUS | Status: DC | PRN
  Administered 2018-05-27: 09:00:00 via INTRAVENOUS

## 2018-05-27 MED ORDER — 0.9 % SODIUM CHLORIDE (POUR BTL) OPTIME
TOPICAL | Status: DC | PRN
  Administered 2018-05-27: 1000 mL

## 2018-05-27 MED ORDER — PROPOFOL 10 MG/ML IV BOLUS
INTRAVENOUS | Status: DC | PRN
  Administered 2018-05-27: 140 mg via INTRAVENOUS

## 2018-05-27 MED ORDER — MENTHOL 3 MG MT LOZG: 1.0000 | LOZENGE | OROMUCOSAL | Status: DC | PRN

## 2018-05-27 MED ORDER — FENTANYL CITRATE (PF) 100 MCG/2ML IJ SOLN: 25.0000 ug | INTRAMUSCULAR | Status: DC | PRN

## 2018-05-27 MED ORDER — DEXAMETHASONE SODIUM PHOSPHATE 10 MG/ML IJ SOLN
INTRAMUSCULAR | Status: AC
  Filled 2018-05-27: qty 1

## 2018-05-27 MED ORDER — DEXAMETHASONE SODIUM PHOSPHATE 10 MG/ML IJ SOLN
INTRAMUSCULAR | Status: DC | PRN
  Administered 2018-05-27: 10 mg via INTRAVENOUS

## 2018-05-27 MED ORDER — HYDROMORPHONE HCL 1 MG/ML IJ SOLN: 0.5000 mg | INTRAMUSCULAR | Status: DC | PRN

## 2018-05-27 MED ORDER — METHOCARBAMOL 500 MG PO TABS: 500.0000 mg | ORAL_TABLET | Freq: Four times a day (QID) | ORAL | Status: DC | PRN

## 2018-05-27 MED ORDER — ONDANSETRON HCL 4 MG/2ML IJ SOLN
INTRAMUSCULAR | Status: AC
  Filled 2018-05-27: qty 2

## 2018-05-27 MED ORDER — BUPIVACAINE HCL (PF) 0.25 % IJ SOLN
INTRAMUSCULAR | Status: DC | PRN
  Administered 2018-05-27: 15 mL

## 2018-05-27 MED ORDER — ROCURONIUM BROMIDE 50 MG/5ML IV SOSY
PREFILLED_SYRINGE | INTRAVENOUS | Status: DC | PRN
  Administered 2018-05-27: 50 mg via INTRAVENOUS

## 2018-05-27 MED ORDER — EPHEDRINE SULFATE 50 MG/ML IJ SOLN
INTRAMUSCULAR | Status: DC | PRN
  Administered 2018-05-27 (×2): 5 mg via INTRAVENOUS

## 2018-05-27 MED ORDER — CHLORHEXIDINE GLUCONATE 4 % EX LIQD: 60.0000 mL | Freq: Once | CUTANEOUS | Status: DC

## 2018-05-27 MED ORDER — ONDANSETRON HCL 4 MG PO TABS: 4.0000 mg | ORAL_TABLET | Freq: Four times a day (QID) | ORAL | Status: DC | PRN

## 2018-05-27 MED ORDER — ONDANSETRON HCL 4 MG/2ML IJ SOLN
4.0000 mg | Freq: Four times a day (QID) | INTRAMUSCULAR | Status: DC | PRN
  Administered 2018-05-28: 4 mg via INTRAVENOUS
  Filled 2018-05-27: qty 2

## 2018-05-27 MED ORDER — METOCLOPRAMIDE HCL 5 MG/ML IJ SOLN: 5.0000 mg | Freq: Three times a day (TID) | INTRAMUSCULAR | Status: DC | PRN

## 2018-05-27 MED ORDER — ALUM & MAG HYDROXIDE-SIMETH 200-200-20 MG/5ML PO SUSP
30.0000 mL | ORAL | Status: DC | PRN
  Administered 2018-05-28: 30 mL via ORAL
  Filled 2018-05-27: qty 30

## 2018-05-27 MED ORDER — GLYCOPYRROLATE 0.2 MG/ML IJ SOLN
INTRAMUSCULAR | Status: DC | PRN
  Administered 2018-05-27: 0.2 mg via INTRAVENOUS

## 2018-05-27 MED ORDER — SUGAMMADEX SODIUM 200 MG/2ML IV SOLN
INTRAVENOUS | Status: AC
  Filled 2018-05-27: qty 2

## 2018-05-27 MED ORDER — POLYETHYLENE GLYCOL 3350 17 G PO PACK: 17.0000 g | PACK | Freq: Every day | ORAL | Status: DC | PRN

## 2018-05-27 MED ORDER — PROMETHAZINE HCL 25 MG/ML IJ SOLN: 6.2500 mg | INTRAMUSCULAR | Status: DC | PRN

## 2018-05-27 MED ORDER — MIDAZOLAM HCL 2 MG/2ML IJ SOLN
2.0000 mg | Freq: Once | INTRAMUSCULAR | Status: AC
  Administered 2018-05-27: 1 mg via INTRAVENOUS

## 2018-05-27 MED ORDER — ROCURONIUM BROMIDE 50 MG/5ML IV SOSY
PREFILLED_SYRINGE | INTRAVENOUS | Status: AC
  Filled 2018-05-27: qty 10

## 2018-05-27 MED ORDER — OXYCODONE HCL 5 MG PO TABS: 5.0000 mg | ORAL_TABLET | Freq: Once | ORAL | Status: DC | PRN

## 2018-05-27 MED ORDER — EPHEDRINE 5 MG/ML INJ
INTRAVENOUS | Status: AC
  Filled 2018-05-27: qty 10

## 2018-05-27 MED ORDER — RIVAROXABAN 15 MG PO TABS
15.0000 mg | ORAL_TABLET | Freq: Every day | ORAL | Status: DC
  Filled 2018-05-27: qty 1

## 2018-05-27 MED ORDER — OXYCODONE HCL 5 MG PO TABS: 5.0000 mg | ORAL_TABLET | ORAL | Status: DC | PRN

## 2018-05-27 MED ORDER — FENTANYL CITRATE (PF) 100 MCG/2ML IJ SOLN
INTRAMUSCULAR | Status: AC
  Administered 2018-05-27: 50 ug via INTRAVENOUS
  Filled 2018-05-27: qty 2

## 2018-05-27 MED ORDER — METHOCARBAMOL 1000 MG/10ML IJ SOLN
500.0000 mg | Freq: Four times a day (QID) | INTRAVENOUS | Status: DC | PRN
  Filled 2018-05-27: qty 5

## 2018-05-27 MED ORDER — SUGAMMADEX SODIUM 200 MG/2ML IV SOLN
INTRAVENOUS | Status: DC | PRN
  Administered 2018-05-27: 150 mg via INTRAVENOUS

## 2018-05-27 MED ORDER — LIDOCAINE 2% (20 MG/ML) 5 ML SYRINGE
INTRAMUSCULAR | Status: AC
  Filled 2018-05-27: qty 10

## 2018-05-27 MED ORDER — ACETAMINOPHEN 325 MG PO TABS: 325.0000 mg | ORAL_TABLET | Freq: Four times a day (QID) | ORAL | Status: DC | PRN

## 2018-05-27 MED ORDER — FLEET ENEMA 7-19 GM/118ML RE ENEM: 1.0000 | ENEMA | Freq: Once | RECTAL | Status: DC | PRN

## 2018-05-27 MED ORDER — METOCLOPRAMIDE HCL 5 MG PO TABS: 5.0000 mg | ORAL_TABLET | Freq: Three times a day (TID) | ORAL | Status: DC | PRN

## 2018-05-27 MED ORDER — ONDANSETRON HCL 4 MG/2ML IJ SOLN
INTRAMUSCULAR | Status: DC | PRN
  Administered 2018-05-27: 4 mg via INTRAVENOUS

## 2018-05-27 MED ORDER — DOCUSATE SODIUM 100 MG PO CAPS
100.0000 mg | ORAL_CAPSULE | Freq: Two times a day (BID) | ORAL | Status: DC
  Administered 2018-05-27: 100 mg via ORAL
  Filled 2018-05-27: qty 1

## 2018-05-27 MED ORDER — OXYCODONE HCL 5 MG PO TABS: 10.0000 mg | ORAL_TABLET | ORAL | Status: DC | PRN

## 2018-05-27 MED ORDER — DIPHENHYDRAMINE HCL 12.5 MG/5ML PO ELIX
12.5000 mg | ORAL_SOLUTION | ORAL | Status: DC | PRN
  Administered 2018-05-28: 25 mg via ORAL
  Filled 2018-05-27: qty 10

## 2018-05-27 MED ORDER — FENTANYL CITRATE (PF) 100 MCG/2ML IJ SOLN
INTRAMUSCULAR | Status: DC | PRN
  Administered 2018-05-27: 100 ug via INTRAVENOUS

## 2018-05-27 MED ORDER — FENTANYL CITRATE (PF) 250 MCG/5ML IJ SOLN
INTRAMUSCULAR | Status: AC
  Filled 2018-05-27: qty 5

## 2018-05-27 MED ORDER — PHENYLEPHRINE HCL 10 MG/ML IJ SOLN
INTRAMUSCULAR | Status: DC | PRN
  Administered 2018-05-27: 80 ug via INTRAVENOUS

## 2018-05-27 MED ORDER — LACTATED RINGERS IV SOLN
INTRAVENOUS | Status: DC
  Administered 2018-05-27: 14:00:00 via INTRAVENOUS

## 2018-05-27 MED ORDER — BISACODYL 5 MG PO TBEC: 5.0000 mg | DELAYED_RELEASE_TABLET | Freq: Every day | ORAL | Status: DC | PRN

## 2018-05-27 MED ORDER — SODIUM CHLORIDE 0.9 % IV SOLN
INTRAVENOUS | Status: DC | PRN
  Administered 2018-05-27: 20 ug/min via INTRAVENOUS

## 2018-05-27 MED ORDER — CEFAZOLIN SODIUM-DEXTROSE 2-4 GM/100ML-% IV SOLN
2.0000 g | INTRAVENOUS | Status: AC
  Administered 2018-05-27: 2 g via INTRAVENOUS
  Filled 2018-05-27: qty 100

## 2018-05-27 SURGICAL SUPPLY — 63 items
BIT DRILL 5/64X5 DISP (BIT) ×2 IMPLANT
BLADE SAW SGTL 83.5X18.5 (BLADE) ×2 IMPLANT
CEMENT BONE DEPUY (Cement) ×2 IMPLANT
COVER SURGICAL LIGHT HANDLE (MISCELLANEOUS) ×2 IMPLANT
COVER WAND RF STERILE (DRAPES) ×2 IMPLANT
DERMABOND ADHESIVE PROPEN (GAUZE/BANDAGES/DRESSINGS) ×1
DERMABOND ADVANCED (GAUZE/BANDAGES/DRESSINGS) ×1
DERMABOND ADVANCED .7 DNX12 (GAUZE/BANDAGES/DRESSINGS) ×1 IMPLANT
DERMABOND ADVANCED .7 DNX6 (GAUZE/BANDAGES/DRESSINGS) ×1 IMPLANT
DRAPE ORTHO SPLIT 77X108 STRL (DRAPES) ×2
DRAPE SURG 17X11 SM STRL (DRAPES) ×2 IMPLANT
DRAPE SURG ORHT 6 SPLT 77X108 (DRAPES) ×2 IMPLANT
DRAPE U-SHAPE 47X51 STRL (DRAPES) ×2 IMPLANT
DRSG AQUACEL AG ADV 3.5X10 (GAUZE/BANDAGES/DRESSINGS) ×2 IMPLANT
DURAPREP 26ML APPLICATOR (WOUND CARE) ×2 IMPLANT
ELECT BLADE 4.0 EZ CLEAN MEGAD (MISCELLANEOUS) ×2
ELECT CAUTERY BLADE 6.4 (BLADE) ×2 IMPLANT
ELECT REM PT RETURN 9FT ADLT (ELECTROSURGICAL) ×2
ELECTRODE BLDE 4.0 EZ CLN MEGD (MISCELLANEOUS) ×1 IMPLANT
ELECTRODE REM PT RTRN 9FT ADLT (ELECTROSURGICAL) ×1 IMPLANT
FACESHIELD WRAPAROUND (MASK) ×8 IMPLANT
GLENOID UNI VAULTLOCK LRG (Shoulder) ×2 IMPLANT
GLOVE BIO SURGEON STRL SZ7.5 (GLOVE) ×2 IMPLANT
GLOVE BIO SURGEON STRL SZ8 (GLOVE) ×2 IMPLANT
GLOVE EUDERMIC 7 POWDERFREE (GLOVE) ×2 IMPLANT
GLOVE SS BIOGEL STRL SZ 7.5 (GLOVE) ×1 IMPLANT
GLOVE SUPERSENSE BIOGEL SZ 7.5 (GLOVE) ×1
GOWN STRL REUS W/ TWL LRG LVL3 (GOWN DISPOSABLE) ×1 IMPLANT
GOWN STRL REUS W/ TWL XL LVL3 (GOWN DISPOSABLE) ×2 IMPLANT
GOWN STRL REUS W/TWL LRG LVL3 (GOWN DISPOSABLE) ×1
GOWN STRL REUS W/TWL XL LVL3 (GOWN DISPOSABLE) ×2
HEAD HUMERAL UNIV 46/20 (Head) ×2 IMPLANT
KIT BASIN OR (CUSTOM PROCEDURE TRAY) ×2 IMPLANT
KIT SET UNIVERSAL (KITS) ×2 IMPLANT
KIT TURNOVER KIT B (KITS) ×2 IMPLANT
MANIFOLD NEPTUNE II (INSTRUMENTS) ×2 IMPLANT
NEEDLE TAPERED W/ NITINOL LOOP (MISCELLANEOUS) ×2 IMPLANT
NS IRRIG 1000ML POUR BTL (IV SOLUTION) ×2 IMPLANT
PACK SHOULDER (CUSTOM PROCEDURE TRAY) ×2 IMPLANT
PAD ARMBOARD 7.5X6 YLW CONV (MISCELLANEOUS) ×4 IMPLANT
RESTRAINT HEAD UNIVERSAL NS (MISCELLANEOUS) ×2 IMPLANT
SLING ARM FOAM STRAP LRG (SOFTGOODS) IMPLANT
SLING ARM IMMOBILIZER LRG (SOFTGOODS) ×4 IMPLANT
SLING ARM IMMOBILIZER MED (SOFTGOODS) IMPLANT
SLING ARM XL FOAM STRAP (SOFTGOODS) ×2 IMPLANT
SMARTMIX MINI TOWER (MISCELLANEOUS) ×2
SPONGE LAP 18X18 X RAY DECT (DISPOSABLE) ×2 IMPLANT
SPONGE LAP 4X18 RFD (DISPOSABLE) ×2 IMPLANT
STEM HUMERAL APEX UNI 12MM (Stem) ×2 IMPLANT
SUCTION FRAZIER HANDLE 10FR (MISCELLANEOUS) ×1
SUCTION TUBE FRAZIER 10FR DISP (MISCELLANEOUS) ×1 IMPLANT
SUT FIBERWIRE #2 38 T-5 BLUE (SUTURE) ×2
SUT MNCRL AB 3-0 PS2 18 (SUTURE) ×2 IMPLANT
SUT MON AB 2-0 CT1 36 (SUTURE) ×2 IMPLANT
SUT VIC AB 1 CT1 27 (SUTURE) ×4
SUT VIC AB 1 CT1 27XBRD ANBCTR (SUTURE) ×4 IMPLANT
SUTURE FIBERWR #2 38 T-5 BLUE (SUTURE) ×1 IMPLANT
SUTURE TAPE 1.3 40 TPR END (SUTURE) ×3 IMPLANT
SUTURETAPE 1.3 40 TPR END (SUTURE) ×6
SYR CONTROL 10ML LL (SYRINGE) IMPLANT
TOWEL OR 17X26 10 PK STRL BLUE (TOWEL DISPOSABLE) ×2 IMPLANT
TOWER SMARTMIX MINI (MISCELLANEOUS) ×1 IMPLANT
WATER STERILE IRR 1000ML POUR (IV SOLUTION) ×2 IMPLANT

## 2018-05-27 NOTE — Anesthesia Procedure Notes (Signed)
Procedure Name: Intubation Date/Time: 05/27/2018 9:36 AM Performed by: Candis Shine, CRNA Pre-anesthesia Checklist: Patient identified, Emergency Drugs available, Suction available and Patient being monitored Patient Re-evaluated:Patient Re-evaluated prior to induction Oxygen Delivery Method: Circle System Utilized Preoxygenation: Pre-oxygenation with 100% oxygen Induction Type: IV induction Ventilation: Mask ventilation without difficulty Laryngoscope Size: Glidescope and 3 Grade View: Grade I Tube type: Oral Tube size: 7.0 mm Number of attempts: 3 Airway Equipment and Method: Stylet,  Video-laryngoscopy and Bougie stylet Placement Confirmation: ETT inserted through vocal cords under direct vision,  positive ETCO2 and breath sounds checked- equal and bilateral Secured at: 23 (at lips) cm Tube secured with: Tape Dental Injury: Teeth and Oropharynx as per pre-operative assessment  Difficulty Due To: Difficulty was unanticipated and Difficult Airway- due to anterior larynx Future Recommendations: Recommend- induction with short-acting agent, and alternative techniques readily available Comments: See quick note by Dr. Daiva Huge. Intubation by Oswaldo Milian, SRNA.

## 2018-05-27 NOTE — Anesthesia Postprocedure Evaluation (Signed)
Anesthesia Post Note  [name]  Procedure(s) Performed: LEFT TOTAL SHOULDER ARTHROPLASTY (Left Shoulder)     Patient location during evaluation: PACU Anesthesia Type: General Level of consciousness: awake and alert Pain management: pain level controlled Vital Signs Assessment: post-procedure vital signs reviewed and stable Respiratory status: spontaneous breathing, nonlabored ventilation and respiratory function stable Cardiovascular status: blood pressure returned to baseline and stable Postop Assessment: no apparent nausea or vomiting Anesthetic complications: no    Last Vitals:  Vitals:   05/27/18 0753 05/27/18 1127  BP: 117/74   Pulse: 67   Resp: 18   Temp: (!) 36.4 C (!) (P) 36.4 C  SpO2: 98%     Last Pain:  Vitals:   05/27/18 1127  TempSrc:   PainSc: (P) 0-No pain                 Brennan Bailey

## 2018-05-27 NOTE — Discharge Instructions (Signed)
° °Kevin M. Supple, M.D., F.A.A.O.S. °Orthopaedic Surgery °Specializing in Arthroscopic and Reconstructive °Surgery of the Shoulder and Knee °[phone] °[address] - [place], [place] [address] - Fax [phone] ° ° °POST-OP TOTAL SHOULDER REPLACEMENT INSTRUCTIONS ° °1. Call the office at [phone] to schedule your first post-op appointment 10-14 days from the date of your surgery. ° °2. The bandage over your incision is waterproof. You may begin showering with this dressing on. You may leave this dressing on until first follow up appointment within 2 weeks. We prefer you leave this dressing in place until follow up however after 5-7 days if you are having itching or skin irritation and would like to remove it you may do so. Go slow and tug at the borders gently to break the bond the dressing has with the skin. At this point if there is no drainage it is okay to go without a bandage or you may cover it with a light guaze and tape. You can also expect significant bruising around your shoulder that will drift down your arm and into your chest wall. This is very normal and should resolve over several days. ° ° 3. Wear your sling/immobilizer at all times except to perform the exercises below or to occasionally let your arm dangle by your side to stretch your elbow. You also need to sleep in your sling immobilizer until instructed otherwise. ° °4. Range of motion to your elbow, wrist, and hand are encouraged 3-5 times daily. Exercise to your hand and fingers helps to reduce swelling you may experience. ° °5. Utilize ice to the shoulder 3-5 times minimum a day and additionally if you are experiencing pain. ° °6. Prescriptions for a pain medication and a muscle relaxant are provided for you. It is recommended that if you are experiencing pain that you pain medication alone is not controlling, add the muscle relaxant along with the pain medication which can give additional pain relief. The first 1-2 days  is generally the most severe of your pain and then should gradually decrease. As your pain lessens it is recommended that you decrease your use of the pain medications to an "as needed basis'" only and to always comply with the recommended dosages of the pain medications. ° °7. Pain medications can produce constipation along with their use. If you experience this, the use of an over the counter stool softener or laxative daily is recommended.  ° °8. For additional questions or concerns, please do not hesitate to call the office. If after hours there is an answering service to forward your concerns to the physician on call. ° °9.Pain control following an exparel block ° °To help control your post-operative pain you received a nerve block  performed with Exparel which is a long acting anesthetic (numbing agent) which can provide pain relief and sensations of numbness (and relief of pain) in the operative shoulder and arm for up to 3 days. Sometimes it provides mixed relief, meaning you may still have numbness in certain areas of the arm but can still be able to move  parts of that arm, hand, and fingers. We recommend that your prescribed pain medications  be used as needed. We do not feel it is necessary to "pre medicate" and "stay ahead" of pain.  Taking narcotic pain medications when you are not having any pain can lead to unnecessary and potentially dangerous side effects.  ° °POST-OP EXERCISES ° °Pendulum Exercises ° °Perform pendulum exercises while standing and bending at   the waist. Support your uninvolved arm on a table or chair and allow your operated arm to hang freely. Make sure to do these exercises passively - not using you shoulder muscles. ° °Repeat 20 times. Do 3 sessions per day. ° ° ° ° °

## 2018-05-27 NOTE — Anesthesia Procedure Notes (Signed)
Anesthesia Regional Block: Interscalene brachial plexus block   Pre-Anesthetic Checklist: ,, timeout performed, Correct Patient, Correct Site, Correct Laterality, Correct Procedure, Correct Position, site marked, Risks and benefits discussed, pre-op evaluation,  At surgeon's request and post-op pain management  Laterality: Left  Prep: Maximum Sterile Barrier Precautions used, chloraprep       Needles:  Injection technique: Single-shot  Needle Type: Echogenic Stimulator Needle     Needle Length: 4cm  Needle Gauge: 22     Additional Needles:   Procedures:,,,, ultrasound used (permanent image in chart),,,,  Narrative:  Start time: 05/27/2018 8:44 AM End time: 05/27/2018 8:50 AM Injection made incrementally with aspirations every 5 mL.  Performed by: Personally  Anesthesiologist: Brennan Bailey, MD  Additional Notes: Risks, benefits, and alternative discussed. Patient gave consent for procedure. Patient prepped and draped in sterile fashion. Sedation administered, patient remains easily responsive to voice. Relevant anatomy identified with ultrasound guidance. Local anesthetic given in 5cc increments with no signs or symptoms of intravascular injection. No pain or paraesthesias with injection. Patient monitored throughout procedure with signs of LAST or immediate complications. Tolerated well. Ultrasound image placed in chart.  Tawny Asal, MD

## 2018-05-27 NOTE — Op Note (Signed)
05/27/2018  11:07 AM  PATIENT:   Nicholas Terry  67 y.o. male  PRE-OPERATIVE DIAGNOSIS:  left shoulder osteoarthritis  POST-OPERATIVE DIAGNOSIS: Same  PROCEDURE: Left total shoulder arthroplasty utilizing a large glenoid, size 12 Arthrex stem with a 46 x 20 eccentric head  SURGEON:  Marin Shutter M.D.  ASSISTANTS: Jenetta Loges, PA-C  ANESTHESIA:   General endotracheal as well as interscalene block with Exparel  EBL: 150 cc  SPECIMEN: None  Drains: None   PATIENT DISPOSITION:  PACU - hemodynamically stable.    PLAN OF CARE: Admit for overnight observation  Brief history:  [name] has been followed for chronic and progressively increasing left shoulder pain related to end-stage osteoarthritis.  His plain films confirm complete loss of joint space with subchondral sclerosis as well as peripheral osteophyte formation.  Examination demonstrates profoundly restricted and painful motion.  He is brought to the operating this time for planned left total shoulder arthroplasty  Preoperatively and counseled [name] regarding treatment options as well as the potential risks versus benefits thereof.  Possible surgical complications were all reviewed including potential bleeding, infection, neurovascular injury, persistent pain, loss of motion, failure of the implant, anesthetic complication, and possible need for additional surgery.  He understands and accepts and agrees with the planned procedure.  Procedure detail:  After undergoing routine preop evaluation patient received an interscalene block with Exparel placed in the holding area by the anesthesia department.  Received prophylactic antibiotics and TXA.  Brought to the operating placed supine on the operative table underwent smooth induction of a general endotracheal anesthesia.  Placed in the beachchair position and appropriate padding protected.  Left shoulder girdle region was sterilely prepped and draped in standard  fashion.  Timeout was called.  Anterior deltopectoral approach was made through a 10 cm incision.  Skin flaps were elevated and electrocautery was used for hemostasis.  The deltopectoral interval was then developed from proximal to distal with a vein taken laterally conjoined tendon mobilized retracted medially the upper centimeter the pectoralis major tendon was tenotomized to improve exposure we then unroofed the long head biceps tendon and it was tenodesed at the upper border of the pectoralis major tendon tenotomized and the proximal segment was then unroofed and excised.  I then delineated the superior and inferior margins of the subscapularis attachment into the lesser tuberosity once this was completely defined I used an oscillating saw to perform a lesser tuberosity osteotomy removing a thin wafer of bone approximately 3 to 4 mm in thickness over the width of the lesser tuberosity this was then mobilized tag suture placed and this was then reflected medially.  We then divided the capsular attachments from the anterior and inferior margins of the humeral neck allowing the humeral head to be delivered to the wound carefully protect the rotator cuff superiorly and posteriorly.  We outlined the proposed humeral head resection with the extra medullary guide this was then completed with an oscillating saw maintain the native retroversion approximately 20 to 30 degrees.  We then remove the large osteophytes from the anterior and inferior margins of the humeral neck.  The humeral canal was then prepped prepared and then broached up to a size 12 implant.  Size 11 implant with metal cap been placed into the humeral medullary canal protecting the cut surface of the proximal humerus and at this point we then exposed the glenoid with combination of Fukuda, pitchfork, and stick, retractors.  I performed a circumferential labral resection gaining complete visualization  the periphery of the glenoid.  Large glenoid showed  best fit.  Guidepin was then placed into the center of the glenoid was then reamed the glenoid to several subchondral bony bed and it was terminally prepared followed by the central and then superior and inferior drill holes and slot respectively the glenoid was then broached and trial pitch showed excellent fixation.  The glenoid was irrigated cleaned and dried cement was mixed introduced into the superior and inferior peg and slot respectively and the spinal size large glenoid was impacted in position with excellent fit and fixation.  At this point we performed a series of trial reductions and ultimately found that the 46 x 20 eccentric head gave [place] the best soft tissue balance with 50% translation of the humeral head of the glenoid and good coverage of the proximal humerus.  The final head was then positioned after the [facility] taper was cleaned and dried the head was impacted final reduction was performed and again we are very pleased with the overall soft tissue balance.  This point we completed the repair of the lesser tuberosity osteotomy and this had been initially prepared for by a rating drill holes through the humeral metaphyseal region which allowed [place] to take suture limbs which we had through to the eyelets on our stem and prior to stem implantation the sutures were passed through our medial and lateral drill holes.  The suture limbs were then passed through the bone tendon junction medially and then utilize a series of 4 suture limbs to repair the lesser tuberosity out of my the superior and inferior transverse suture limbs and a pair of crossing suture limbs all composed of FiberWire and fiber tape which allowed excellent re-apposition of the lesser tuberosity osteotomy fragment back to the bed on the proximal humerus and the stability of the repair is much to our satisfaction.  I then repaired the rotator interval with a series of figure-of-eight #2 FiberWire sutures.  Once completed the [employer] easily  showed 30 degrees of X rotation without excessive tension on the subscapularis repair.  This point final irrigation was then completed.  Hemostasis was obtained.  The deltopectoral interval was closed with a series of interrupted figure-of-eight and 1 Vicryl sutures.  2-0 Monocryl used for the subcu layer intracuticular 3-0 Monocryl for the skin followed by Dermabond and Aquasol dressing.  Left arm was placed into a sling immobilizer and the patient was awakened, extubated, taken recovery in stable condition  Jenetta Loges, PA-C was used as an Environmental consultant throughout this case essential for help with positioning of the patient, position extremity, retraction, implantation of the prosthesis, wound closure, and intraoperative decision-making.  Marin Shutter MD   Contact # [phone]

## 2018-05-27 NOTE — H&P (Signed)
Nadene Rubins    Chief Complaint: left shoulder osteoarthritis HPI: The patient is a 67 y.o. male with end stage left shoulder OA  Past Medical History:  Diagnosis Date  . Arthritis   . Complication of anesthesia   . DVT (deep venous thrombosis) (HCC)    bil calf  . Foot mass, left    left plantar forefoot  . History of kidney stones   . PONV (postoperative nausea and vomiting)   . Seasonal allergies     Past Surgical History:  Procedure Laterality Date  . JOINT REPLACEMENT Left    knee  . KIDNEY STONE SURGERY     x3  . KNEE ARTHROSCOPY Bilateral   . MASS EXCISION Left 03/11/2018   Procedure: Excisional biopsy of left forefoot mass;  Surgeon: Wylene Simmer, MD;  Location: [facility];  Service: Orthopedics;  Laterality: Left;  60 mins  . SHOULDER ARTHROSCOPY Right     History reviewed. No pertinent family history.  Social History:  reports that he has never smoked. He has never used smokeless tobacco. He reports that he drinks alcohol. He reports that he does not use drugs.   Medications Prior to Admission  Medication Sig Dispense Refill  . acetaminophen (TYLENOL) 500 MG tablet Take 500 mg by mouth 2 (two) times daily as needed for moderate pain.     . Carboxymethylcellul-Glycerin (REFRESH OPTIVE OP) Place 1 drop into both eyes daily.    Marland Kitchen loratadine (CLARITIN) 10 MG tablet Take 10 mg by mouth daily.    . Multiple Vitamin (MULTIVITAMIN WITH MINERALS) TABS tablet Take 1 tablet by mouth daily.    . Rivaroxaban (XARELTO) 15 MG TABS tablet Take 15 mg by mouth every morning.     . Saw Palmetto 500 MG CAPS Take 500 mg by mouth daily.     . traMADol (ULTRAM) 50 MG tablet Take 50 mg by mouth daily as needed for severe pain.       Physical Exam: left shoulder with painful and restricted motion as noted at recent office visits  Vitals  Temp:  [97.5 F (36.4 C)] 97.5 F (36.4 C) (11/07 0753) Pulse Rate:  [67] 67 (11/07 0753) Resp:  [18] 18 (11/07 0753) BP:  (117)/(74) 117/74 (11/07 0753) SpO2:  [98 %] 98 % (11/07 0753) Weight:  [64.5 kg] 64.5 kg (11/07 0746)  Assessment/Plan  Impression: left shoulder osteoarthritis  Plan of Action: Procedure(s): LEFT TOTAL SHOULDER ARTHROPLASTY  Andranik Jeune M Victory Strollo 05/27/2018, 9:03 AM Contact # [phone]

## 2018-05-27 NOTE — Transfer of Care (Signed)
Immediate Anesthesia Transfer of Care Note  [name]  Procedure(s) Performed: LEFT TOTAL SHOULDER ARTHROPLASTY (Left Shoulder)  Patient Location: PACU  Anesthesia Type:General and GA combined with regional for post-op pain  Level of Consciousness: awake, alert , oriented and patient cooperative  Airway & Oxygen Therapy: Patient Spontanous Breathing and Patient connected to nasal cannula oxygen  Post-op Assessment: Report given to RN, Post -op Vital signs reviewed and stable and Patient moving all extremities X 4  Post vital signs: Reviewed and stable  Last Vitals:  Vitals Value Taken Time  BP 113/75 05/27/2018 11:27 AM  Temp    Pulse 77 05/27/2018 11:29 AM  Resp 26 05/27/2018 11:29 AM  SpO2 100 % 05/27/2018 11:29 AM  Vitals shown include unvalidated device data.  Last Pain:  Vitals:   05/27/18 0753  TempSrc: Oral  PainSc: 1       Patients Stated Pain Goal: 4 (96/28/36 6294)  Complications: No apparent anesthesia complications

## 2018-05-28 ENCOUNTER — Encounter (HOSPITAL_COMMUNITY): Payer: Self-pay | Admitting: Orthopedic Surgery

## 2018-05-28 MED ORDER — CYCLOBENZAPRINE HCL 10 MG PO TABS: 10.0000 mg | ORAL_TABLET | Freq: Three times a day (TID) | ORAL | 1 refills | Status: DC | PRN

## 2018-05-28 MED ORDER — TRAMADOL HCL 50 MG PO TABS: 50.0000 mg | ORAL_TABLET | Freq: Every day | ORAL | 0 refills | Status: DC | PRN

## 2018-05-28 MED ORDER — OXYCODONE-ACETAMINOPHEN 5-325 MG PO TABS: 1.0000 | ORAL_TABLET | ORAL | 0 refills | Status: DC | PRN

## 2018-05-28 MED ORDER — ONDANSETRON HCL 4 MG PO TABS: 4.0000 mg | ORAL_TABLET | Freq: Three times a day (TID) | ORAL | 0 refills | Status: DC | PRN

## 2018-05-28 NOTE — Discharge Summary (Signed)
PATIENT ID:      Nicholas Terry  MRN:     [id number] DOB/AGE:    68-Jul-1952 / 68 y.o.     DISCHARGE SUMMARY  ADMISSION DATE:    05/27/2018 DISCHARGE DATE:    ADMISSION DIAGNOSIS: left shoulder osteoarthritis Past Medical History:  Diagnosis Date  . Arthritis   . Complication of anesthesia   . DVT (deep venous thrombosis) (HCC)    bil calf  . Foot mass, left    left plantar forefoot  . History of kidney stones   . PONV (postoperative nausea and vomiting)   . Seasonal allergies     DISCHARGE DIAGNOSIS:   Active Problems:   S/P shoulder replacement, left   PROCEDURE: Procedure(s): LEFT TOTAL SHOULDER ARTHROPLASTY on 05/27/2018  CONSULTS:    HISTORY:  See H&P in chart.  HOSPITAL COURSE:  Nicholas Terry is a 67 y.o. admitted on 05/27/2018 with a diagnosis of left shoulder osteoarthritis.  They were brought to the operating room on 05/27/2018 and underwent Procedure(s): LEFT TOTAL SHOULDER ARTHROPLASTY.    They were given perioperative antibiotics:  Anti-infectives (From admission, onward)   Start     Dose/Rate Route Frequency Ordered Stop   05/27/18 0730  ceFAZolin (ANCEF) IVPB 2g/100 mL premix     2 g 200 mL/hr over 30 Minutes Intravenous On call to O.R. 05/27/18 0725 05/27/18 4174    .  Patient underwent the above named procedure and tolerated it well. The following day they were hemodynamically stable and pain was controlled on oral analgesics. They were neurovascularly intact to the operative extremity. OT was ordered and worked with patient per protocol. They were medically and orthopaedically stable for discharge on day 1.    DIAGNOSTIC STUDIES:  RECENT RADIOGRAPHIC STUDIES :  No results found.  RECENT VITAL SIGNS:   Patient Vitals for the past 24 hrs:  BP Temp Temp src Pulse Resp SpO2  05/28/18 0500 115/80 98.4 F (36.9 C) Oral 89 16 98 %  05/28/18 0026 102/67 98.2 F (36.8 C) Oral 73 16 96 %  05/27/18 2026 104/72 98.6 F (37 C) Oral 87 16 95 %  05/27/18  1234 108/71 - - 61 19 97 %  05/27/18 1233 - 98.4 F (36.9 C) Axillary - - -  05/27/18 1215 101/76 - - (!) 57 15 98 %  05/27/18 1200 - - - (!) 57 11 98 %  05/27/18 1145 - - - 65 14 100 %  05/27/18 1140 108/79 - - 66 17 99 %  05/27/18 1130 - - - 77 (!) 26 100 %  05/27/18 1127 113/75 (!) 97.5 F (36.4 C) - 97 18 98 %  .  RECENT EKG RESULTS:   No orders found for this or any previous visit.  DISCHARGE INSTRUCTIONS:    DISCHARGE MEDICATIONS:   Allergies as of 05/28/2018   No Known Allergies     Medication List    TAKE these medications   acetaminophen 500 MG tablet Commonly known as:  TYLENOL Take 500 mg by mouth 2 (two) times daily as needed for moderate pain.   cyclobenzaprine 10 MG tablet Commonly known as:  FLEXERIL Take 1 tablet (10 mg total) by mouth 3 (three) times daily as needed for muscle spasms.   loratadine 10 MG tablet Commonly known as:  CLARITIN Take 10 mg by mouth daily.   multivitamin with minerals Tabs tablet Take 1 tablet by mouth daily.   ondansetron 4 MG tablet Commonly known as:  ZOFRAN Take 1  tablet (4 mg total) by mouth every 8 (eight) hours as needed for nausea or vomiting.   oxyCODONE-acetaminophen 5-325 MG tablet Commonly known as:  PERCOCET/ROXICET Take 1 tablet by mouth every 4 (four) hours as needed (max 6 q).   REFRESH OPTIVE OP Place 1 drop into both eyes daily.   Rivaroxaban 15 MG Tabs tablet Commonly known as:  XARELTO Take 15 mg by mouth every morning.   Saw Palmetto 500 MG Caps Take 500 mg by mouth daily.   traMADol 50 MG tablet Commonly known as:  ULTRAM Take 1 tablet (50 mg total) by mouth daily as needed for moderate pain or severe pain. What changed:  reasons to take this       FOLLOW UP VISIT:   Follow-up Information    Justice Britain, MD.   Specialty:  Orthopedic Surgery Why:  call to be seen in 10-14 days Contact information: [address] [place] [address] [phone]            DISCHARGE TO: Home   DISCHARGE CONDITION:  Thereasa Parkin Sava Proby for Dr. Lennette Bihari Supple 05/28/2018, 8:30 AM

## 2018-05-28 NOTE — Progress Notes (Signed)
Written and verbal discharge instructions provided to the patient and his girlfriend.  Verbalizes understanding those instructions and follow up.  The patient will be discharged to home via wheel chair after Physical Therapy session.

## 2018-05-28 NOTE — Plan of Care (Signed)
  Problem: Pain Managment: Goal: General experience of comfort will improve Outcome: Progressing   Problem: Clinical Measurements: Goal: Ability to maintain clinical measurements within normal limits will improve Outcome: Progressing   

## 2018-05-28 NOTE — Progress Notes (Signed)
Occupational Therapy Evaluation Patient Details Name: Nicholas Terry MRN: [id number] DOB: [birth year] Today's Date: 05/28/2018  Clinical Impression: Pt provided with shoulder dc handout and HEP as specified below. Pt educated on sling management/positioning/schedule, compensatory strategies for bathing/dressing/sleeping. Pt and significant other verbalized understanding, education complete. OT to defer further therapy to MD at follow up.     05/28/18 0900  OT Visit Information  Last OT Received On 05/28/18  Assistance Needed +1  History of Present Illness Nicholas Terry is a 67 y.o. admitted on 05/27/2018 with a diagnosis of left shoulder osteoarthritis.  They were brought to the operating room on 05/27/2018 and underwent Procedure(s): Left Total Shoulder arthroplasty  Precautions  Precautions Shoulder  Type of Shoulder Precautions passive protocol  Shoulder Interventions Shoulder sling/immobilizer;At all times;Off for dressing/bathing/exercises  Precaution Booklet Issued Yes (comment)  Precaution Comments OK to come out of sling in controlled environment  Required Braces or Orthoses Sling  Restrictions  Weight Bearing Restrictions Yes  LUE Weight Bearing NWB  Home Living  Family/patient expects to be discharged to: Private residence  Living Arrangements Spouse/significant other  Available Help at Discharge Family  Type of [place] None  Prior Function  Level of Independence Independent  Communication  Communication No difficulties  Pain Assessment  Pain Assessment 0-10  Pain Score 1  Pain Location L shoulder  Pain Descriptors / Indicators Dull  Pain Intervention(s) Limited activity within patient's tolerance;Monitored during session;Repositioned;Other (comment) (block still largely in place)  Cognition  Arousal/Alertness Awake/alert  Behavior During Therapy [facility] for tasks assessed/performed  Overall Cognitive Status Within Functional Limits for tasks assessed   Upper Extremity Assessment  Upper Extremity Assessment LUE deficits/detail  LUE Deficits / Details as anticipated post-op deficits  LUE Sensation decreased light touch (block still in place)  LUE Coordination decreased fine motor;decreased gross motor  Cervical / Trunk Assessment  Cervical / Trunk Assessment Normal  ADL  General ADL Comments please see shoulder section below  Bed Mobility  General bed mobility comments pt in recliner at beginning and end of session  Transfers  Overall transfer level Independent  Balance  Overall balance assessment Independent  General Comments  General comments (skin integrity, edema, etc.) wife present throughout session  Exercises  Exercises Shoulder  Shoulder Instructions  Donning/doffing shirt without moving shoulder Maximal assistance;Caregiver independent with task;Patient able to independently direct caregiver  Method for sponge bathing under operated UE Supervision/safety  Donning/doffing sling/immobilizer Moderate assistance;Caregiver independent with task;Patient able to independently direct caregiver  Correct positioning of sling/immobilizer Supervision/safety  Pendulum exercises (written home exercise program) Supervision/safety (educated only since block in place)  ROM for elbow, wrist and digits of operated UE Modified independent  Sling wearing schedule (on at all times/off for ADL's) Independent  Proper positioning of operated UE when showering Supervision/safety  Positioning of UE while sleeping Minimal assistance  Shoulder Exercises  Pendulum Exercise PROM;Left;Seated;Standing (GENTLE)  Shoulder Flexion PROM;Left (educated to 60 degrees for ADL)  Shoulder ABduction PROM;Left (educated to 45 degrees for ADL)  Shoulder External Rotation PROM;Left (educated to 20 degrees for ADL)  Elbow Flexion AROM;Left  Elbow Extension AROM;Left  Wrist Flexion AROM;Left  Wrist Extension AROM;Left  Digit Composite Flexion AROM;Left   Composite Extension AROM;Left  Neck Flexion AROM  Neck Extension AROM  Neck Lateral Flexion - Right AROM  Neck Lateral Flexion - Left AROM  OT - End of Session  Equipment Utilized During Treatment Other (comment) (sling)  Activity Tolerance Patient tolerated treatment well  Patient left  in chair;with call bell/phone within reach;with family/visitor present  Nurse Communication Mobility status  OT Assessment  OT Recommendation/Assessment Progress rehab of shoulder as ordered by MD at follow-up appointment  OT Visit Diagnosis Pain  Pain - Right/Left Left  Pain - part of body Shoulder  OT Problem List Decreased range of motion;Decreased knowledge of use of DME or AE;Decreased knowledge of precautions;Impaired sensation;Impaired UE functional use;Pain  AM-PAC OT "6 Clicks" Daily Activity Outcome Measure  Help from another person eating meals? 3  Help from another person taking care of personal grooming? 3  Help from another person toileting, which includes using toliet, bedpan, or urinal? 4  Help from another person bathing (including washing, rinsing, drying)? 3  Help from another person to put on and taking off regular upper body clothing? 2  Help from another person to put on and taking off regular lower body clothing? 2  6 Click Score 17  ADL G Code Conversion CK  OT Recommendation  Follow Up Recommendations Follow surgeon's recommendation for DC plan and follow-up therapies  OT Equipment None recommended by OT  Acute Rehab OT Goals  Patient Stated Goal get back to swimming  OT Goal Formulation With patient/family  Time For Goal Achievement 06/11/18  Potential to Achieve Goals Good  OT Time Calculation  OT Start Time (ACUTE ONLY) 0930  OT Stop Time (ACUTE ONLY) 0954  OT Time Calculation (min) 24 min  OT General Charges  $OT Visit 1 Visit  OT Evaluation  $OT Eval Moderate Complexity 1 Mod  OT Treatments  $Self Care/Home Management  8-22 mins  Written Expression  Dominant  Hand Right  Hulda Humphrey OTR/L Acute Rehabilitation Services Pager: [phone] Office: [phone]

## 2018-06-15 ENCOUNTER — Ambulatory Visit: Payer: Medicare Other | Attending: Orthopedic Surgery | Admitting: Physical Therapy

## 2018-06-15 ENCOUNTER — Encounter: Payer: Self-pay | Admitting: Physical Therapy

## 2018-06-15 DIAGNOSIS — M25512 Pain in left shoulder: Secondary | ICD-10-CM | POA: Diagnosis present

## 2018-06-15 DIAGNOSIS — R600 Localized edema: Secondary | ICD-10-CM | POA: Diagnosis present

## 2018-06-15 DIAGNOSIS — M25612 Stiffness of left shoulder, not elsewhere classified: Secondary | ICD-10-CM | POA: Diagnosis present

## 2018-06-15 NOTE — Therapy (Signed)
[address] [place], [place], [address] Phone: [phone]   Fax:  [phone]  Physical Therapy Evaluation  Patient Details  Name: Nicholas Terry MRN: [id number] Date of Birth: [birth year] Referring Provider (PT): Supple   Encounter Date: 06/15/2018  PT End of Session - 06/15/18 1052    Visit Number  1    Date for PT Re-Evaluation  08/15/18    PT Start Time  0938    PT Stop Time  1055    PT Time Calculation (min)  40 min    Activity Tolerance  Patient tolerated treatment well    Behavior During Therapy  [facility] for tasks assessed/performed       Past Medical History:  Diagnosis Date  . Arthritis   . Complication of anesthesia   . DVT (deep venous thrombosis) (HCC)    bil calf  . Foot mass, left    left plantar forefoot  . History of kidney stones   . PONV (postoperative nausea and vomiting)   . Seasonal allergies     Past Surgical History:  Procedure Laterality Date  . JOINT REPLACEMENT Left    knee  . KIDNEY STONE SURGERY     x3  . KNEE ARTHROSCOPY Bilateral   . MASS EXCISION Left 03/11/2018   Procedure: Excisional biopsy of left forefoot mass;  Surgeon: Wylene Simmer, MD;  Location: [place];  Service: Orthopedics;  Laterality: Left;  60 mins  . SHOULDER ARTHROSCOPY Right   . TOTAL SHOULDER ARTHROPLASTY Left 05/27/2018  . TOTAL SHOULDER ARTHROPLASTY Left 05/27/2018   Procedure: LEFT TOTAL SHOULDER ARTHROPLASTY;  Surgeon: Justice Britain, MD;  Location: [place];  Service: Orthopedics;  Laterality: Left;  146min    There were no vitals filed for this visit.   Subjective Assessment - 06/15/18 1014    Subjective  Patient reports that he has had left shoulder pain for about 10 years.  Reports that he underwent a left TSR on 05/27/18.  Reports that he is doing pendulums.  Protocol is in chart with order    Limitations  Lifting;House hold activities    Patient Stated Goals  have less  pain and good ROM    Currently in Pain?  Yes    Pain Score  1     Pain Location  Shoulder    Pain Orientation  Left    Pain Descriptors / Indicators  Aching    Pain Type  Acute pain;Surgical pain    Pain Onset  1 to 4 weeks ago    Pain Frequency  Intermittent    Aggravating Factors   motions, pain up to 6/10    Pain Relieving Factors  rest, sling, pain meds pain can be 0/10    Effect of Pain on Daily Activities  limits all ADL's         [facility] PT Assessment - 06/15/18 0001      Assessment   Medical Diagnosis  s/p left TSR    Referring Provider (PT)  Supple    Onset Date/Surgical Date  05/27/18    Hand Dominance  Right    Prior Therapy  no      Precautions   Precaution Comments  Follow shoulder protocol      Balance Screen   Has the patient fallen in the past 6 months  No    Has the patient had a decrease in activity level because of a fear of falling?  No    Is the patient reluctant to leave their home because of a fear of falling?   No      Home Environment   Additional Comments  noramlly would do housework and yardwork      Prior Function   Level of Independence  Independent    Vocation  Retired    Teacher, adult education    Leisure  swimming is something that he would like to get back to      [employer] Comments  fwd head, rounded shoulders, sling on, slouched sitting      ROM / Strength   AROM / PROM / Strength  PROM      PROM   Overall PROM Comments  elbow extension is 10 degrees from full due to stiffness    PROM Assessment Site  Shoulder    Right/Left Shoulder  Left    Left Shoulder Flexion  30 Degrees    Left Shoulder ABduction  40 Degrees    Left Shoulder Internal Rotation  15 Degrees    Left Shoulder External Rotation  0 Degrees      Palpation   Palpation comment  very tight in the left upper trap and shoulder area, he is very guarded, does not allow PROM                Objective measurements  completed on examination: See above findings.              PT Education - 06/15/18 1051    Education Details  seated arm slided on table for flexion and ER, pendulums and elbow extension    Person(s) Educated  Patient;Spouse    Methods  Explanation;Demonstration;Verbal cues;Handout    Comprehension  Verbalized understanding;Returned demonstration       PT Short Term Goals - 06/15/18 1133      PT SHORT TERM GOAL #1   Title  independent with initial HEP    Time  2    Period  Weeks    Status  New        PT Long Term Goals - 06/15/18 1134      PT LONG TERM GOAL #1   Title  understand and do RICE at home    Time  8    Period  Weeks    Status  New      PT LONG TERM GOAL #2   Title  dress without difficulty    Time  8    Period  Weeks    Status  New      PT LONG TERM GOAL #3   Title  increase AROM to 120 degrees flexion    Time  12    Period  Weeks    Status  New      PT LONG TERM GOAL #4   Title  reach into cabinet with 3#    Time  12    Period  Weeks    Status  New      PT LONG TERM GOAL #5   Title  increase AROM of ER to 60 degrees    Time  12    Period  Weeks    Status  New             Plan - 06/15/18 1131    Clinical Impression Statement  Patient reports 10 years of left shoulder pain, underwent a left TSR on 05/27/18.  He reports that he has  been doing well without pain, however he has not been doing any thing and has been in the sling.  He was very gaurded with ROM, could not allow much PROM, he had some decreased ROM in the left elbow from the sling as well.  Protocol is in the chart with the order and in our office    History and Personal Factors relevant to plan of care:  knne replacement, that had to have a manipulation    Clinical Presentation  Evolving    Clinical Decision Making  Low    Rehab Potential  Good    PT Frequency  2x / week    PT Duration  8 weeks    PT Treatment/Interventions  ADLs/Self Care Home  Management;Cryotherapy;Electrical Stimulation;Therapeutic activities;Functional mobility training;Patient/family education;Manual techniques;Passive range of motion;Vasopneumatic Device    PT Next Visit Plan  follow protocol, treat pain as needed    Consulted and Agree with Plan of Care  Patient       Patient will benefit from skilled therapeutic intervention in order to improve the following deficits and impairments:  Decreased range of motion, Impaired UE functional use, Increased muscle spasms, Pain, Improper body mechanics, Impaired flexibility, Decreased strength, Increased edema, Postural dysfunction  Visit Diagnosis: Acute pain of left shoulder - Plan: PT plan of care cert/re-cert  Stiffness of left shoulder, not elsewhere classified - Plan: PT plan of care cert/re-cert  Localized edema - Plan: PT plan of care cert/re-cert     Problem List Patient Active Problem List   Diagnosis Date Noted  . 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

## 2018-06-21 ENCOUNTER — Ambulatory Visit: Payer: Medicare Other | Attending: Orthopedic Surgery | Admitting: Physical Therapy

## 2018-06-21 ENCOUNTER — Encounter: Payer: Self-pay | Admitting: Physical Therapy

## 2018-06-21 DIAGNOSIS — R600 Localized edema: Secondary | ICD-10-CM | POA: Diagnosis present

## 2018-06-21 DIAGNOSIS — M25512 Pain in left shoulder: Secondary | ICD-10-CM | POA: Diagnosis present

## 2018-06-21 DIAGNOSIS — M25612 Stiffness of left shoulder, not elsewhere classified: Secondary | ICD-10-CM | POA: Diagnosis present

## 2018-06-21 NOTE — Therapy (Signed)
[address] [place], [place], [address] Phone: [phone]   Fax:  [phone]  Physical Therapy Treatment  Patient Details  Name: Nicholas Terry MRN: [id number] Date of Birth: 31-Jan-1951 Referring Provider (PT): Supple   Encounter Date: 06/21/2018    Past Medical History:  Diagnosis Date  . Arthritis   . Complication of anesthesia   . DVT (deep venous thrombosis) (HCC)    bil calf  . Foot mass, left    left plantar forefoot  . History of kidney stones   . PONV (postoperative nausea and vomiting)   . Seasonal allergies     Past Surgical History:  Procedure Laterality Date  . JOINT REPLACEMENT Left    knee  . KIDNEY STONE SURGERY     x3  . KNEE ARTHROSCOPY Bilateral   . MASS EXCISION Left 03/11/2018   Procedure: Excisional biopsy of left forefoot mass;  Surgeon: Wylene Simmer, MD;  Location: [place];  Service: Orthopedics;  Laterality: Left;  60 mins  . SHOULDER ARTHROSCOPY Right   . TOTAL SHOULDER ARTHROPLASTY Left 05/27/2018  . TOTAL SHOULDER ARTHROPLASTY Left 05/27/2018   Procedure: LEFT TOTAL SHOULDER ARTHROPLASTY;  Surgeon: Justice Britain, MD;  Location: [place];  Service: Orthopedics;  Laterality: Left;  157min    There were no vitals filed for this visit.  Subjective Assessment - 06/21/18 1016    Subjective  "Pretty well, I do the exercises twice a day."    Currently in Pain?  No/denies                       [facility] Adult PT Treatment/Exercise - 06/21/18 0001      Exercises   Exercises  Shoulder      Shoulder Exercises: Standing   Other Standing Exercises  LUE orange egg squeezes    Other Standing Exercises  AROM LUE elbow flex and ext x20       Shoulder Exercises: Pulleys   Flexion  2 minutes      Shoulder Exercises: Isometric Strengthening   External Rotation  3X5";Supine    ABduction  3X5";Supine      Modalities   Modalities  Vasopneumatic       Vasopneumatic   Number Minutes Vasopneumatic   15 minutes    Vasopnuematic Location   Shoulder    Vasopneumatic Pressure  Medium    Vasopneumatic Temperature   32      Manual Therapy   Manual Therapy  Passive ROM    Manual therapy comments  pt very limited and guarded     Passive ROM  LUE all directions within protocol                PT Short Term Goals - 06/21/18 1110      PT SHORT TERM GOAL #1   Title  independent with initial HEP    Status  Achieved        PT Long Term Goals - 06/15/18 1134      PT LONG TERM GOAL #1   Title  understand and do RICE at home    Time  8    Period  Weeks    Status  New      PT LONG TERM GOAL #2   Title  dress without difficulty    Time  8    Period  Weeks    Status  New      PT  LONG TERM GOAL #3   Title  increase AROM to 120 degrees flexion    Time  12    Period  Weeks    Status  New      PT LONG TERM GOAL #4   Title  reach into cabinet with 3#    Time  12    Period  Weeks    Status  New      PT LONG TERM GOAL #5   Title  increase AROM of ER to 60 degrees    Time  12    Period  Weeks    Status  New            Plan - 06/21/18 1100    Clinical Impression Statement  Pt very guarded during MT. Reports pain with flexion using the pulley with little motion. MT again after pulley exercises. He was able to tolerated passive shoulder flexion to ~ 80 degrees, Following protocol to keep shoulder below 90. Very limited with L shoulder external rotation, some difficulty getting to 0. Pain reported with some of the IR reps to belly.      Rehab Potential  Good    PT Frequency  2x / week    PT Duration  8 weeks    PT Treatment/Interventions  ADLs/Self Care Home Management;Cryotherapy;Electrical Stimulation;Therapeutic activities;Functional mobility training;Patient/family education;Manual techniques;Passive range of motion;Vasopneumatic Device    PT Next Visit Plan  follow protocol, treat pain as needed       Patient  will benefit from skilled therapeutic intervention in order to improve the following deficits and impairments:  Decreased range of motion, Impaired UE functional use, Increased muscle spasms, Pain, Improper body mechanics, Impaired flexibility, Decreased strength, Increased edema, Postural dysfunction  Visit Diagnosis: Acute pain of left shoulder  Stiffness of left shoulder, not elsewhere classified  Localized edema     Problem List Patient Active Problem List   Diagnosis Date Noted  . 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

## 2018-06-24 ENCOUNTER — Ambulatory Visit: Payer: Medicare Other | Admitting: Physical Therapy

## 2018-06-24 DIAGNOSIS — M25512 Pain in left shoulder: Secondary | ICD-10-CM | POA: Diagnosis not present

## 2018-06-24 DIAGNOSIS — R600 Localized edema: Secondary | ICD-10-CM

## 2018-06-24 DIAGNOSIS — M25612 Stiffness of left shoulder, not elsewhere classified: Secondary | ICD-10-CM

## 2018-06-24 NOTE — Patient Instructions (Signed)
Strengthening: Isometric Flexion  Using wall for resistance, press right fist into ball using light pressure. Hold __10__ seconds. Repeat __5_ times per set. Do ____ sets per session. Do _2___ sessions per day.  SHOULDER: Abduction (Isometric)  Use wall as resistance. Press arm against pillow. Keep elbow straight. Hold _10_ seconds. 5___ reps per set, 2___ sets per day, ___ days per week  Extension (Isometric)  Place left bent elbow and back of arm against wall. Press elbow against wall. Hold __10__ seconds. Repeat _5___ times. Do __2__ sessions per day.  External Rotation (Isometric)  Place back of left fist against door frame, with elbow bent. Press fist against door frame. Hold __10__ seconds. Repeat _5___ times. Do ___2_ sessions per day.   SHOULDER: External Rotation - Supine (Cane)   Hold cane with both hands. Rotate arm away from body. Keep elbow on the bed and about 30-45 deg away from your body. 10___ reps per set, 1-3___ sets 4-5 times per day, ___ days per week   Madelyn Flavors, PT 06/24/18 11:01 AM [address] [place], [place], [address] Phone: [phone]   Fax:  [phone]

## 2018-06-24 NOTE — Therapy (Signed)
[address] [place], [place], [address] Phone: [phone]   Fax:  [phone]  Physical Therapy Treatment  Patient Details  Name: Nicholas Terry MRN: [id number] Date of Birth: 20-Jul-1951 Referring Provider (PT): Supple   Encounter Date: 06/24/2018  PT End of Session - 06/24/18 1009    Visit Number  3    Date for PT Re-Evaluation  08/15/18    PT Start Time  1014    PT Stop Time  1113    PT Time Calculation (min)  59 min    Behavior During Therapy  [facility] for tasks assessed/performed       Past Medical History:  Diagnosis Date  . Arthritis   . Complication of anesthesia   . DVT (deep venous thrombosis) (HCC)    bil calf  . Foot mass, left    left plantar forefoot  . History of kidney stones   . PONV (postoperative nausea and vomiting)   . Seasonal allergies     Past Surgical History:  Procedure Laterality Date  . JOINT REPLACEMENT Left    knee  . KIDNEY STONE SURGERY     x3  . KNEE ARTHROSCOPY Bilateral   . MASS EXCISION Left 03/11/2018   Procedure: Excisional biopsy of left forefoot mass;  Surgeon: Wylene Simmer, MD;  Location: [place];  Service: Orthopedics;  Laterality: Left;  60 mins  . SHOULDER ARTHROSCOPY Right   . TOTAL SHOULDER ARTHROPLASTY Left 05/27/2018  . TOTAL SHOULDER ARTHROPLASTY Left 05/27/2018   Procedure: LEFT TOTAL SHOULDER ARTHROPLASTY;  Surgeon: Justice Britain, MD;  Location: [place];  Service: Orthopedics;  Laterality: Left;  181min    There were no vitals filed for this visit.  Subjective Assessment - 06/24/18 1016    Subjective  Patient states he took his sling off this morning for the first time besides showering. He also says he is hypersensitive because he's afraid it's going to hurt.    Patient Stated Goals  have less pain and good ROM    Currently in Pain?  No/denies         [facility] PT Assessment - 06/24/18 0001      PROM   PROM Assessment Site   Shoulder    Left Shoulder Flexion  80 Degrees    Left Shoulder External Rotation  10 Degrees                   [facility] Adult PT Treatment/Exercise - 06/24/18 0001      Exercises   Exercises  Shoulder      Shoulder Exercises: Standing   Other Standing Exercises  pendulum x 2 min      Shoulder Exercises: Pulleys   Flexion  3 minutes      Shoulder Exercises: ROM/Strengthening   Other ROM/Strengthening Exercises  supine cane for IR/ER x 20 then with some holds in ER    Other ROM/Strengthening Exercises  flexion with cane x 10 and along knee x 10      Shoulder Exercises: Isometric Strengthening   Flexion  5X10"    Extension  5X10"    External Rotation  5X10"    ABduction  5X10"             PT Education - 06/24/18 1101    Education Details  HEP    Person(s) Educated  Patient    Methods  Explanation;Demonstration;Handout    Comprehension  Verbalized understanding;Returned demonstration  PT Short Term Goals - 06/21/18 1110      PT SHORT TERM GOAL #1   Title  independent with initial HEP    Status  Achieved        PT Long Term Goals - 06/15/18 1134      PT LONG TERM GOAL #1   Title  understand and do RICE at home    Time  8    Period  Weeks    Status  New      PT LONG TERM GOAL #2   Title  dress without difficulty    Time  8    Period  Weeks    Status  New      PT LONG TERM GOAL #3   Title  increase AROM to 120 degrees flexion    Time  12    Period  Weeks    Status  New      PT LONG TERM GOAL #4   Title  reach into cabinet with 3#    Time  12    Period  Weeks    Status  New      PT LONG TERM GOAL #5   Title  increase AROM of ER to 60 degrees    Time  12    Period  Weeks    Status  New            Plan - 06/24/18 1203    Clinical Impression Statement  Patient did better with manual therapy today and had increase in flexion to 80 and ER to 10 deg by end of treatment.     PT Treatment/Interventions  ADLs/Self Care Home  Management;Cryotherapy;Electrical Stimulation;Therapeutic activities;Functional mobility training;Patient/family education;Manual techniques;Passive range of motion;Vasopneumatic Device    PT Next Visit Plan  follow protocol, treat pain as needed       Patient will benefit from skilled therapeutic intervention in order to improve the following deficits and impairments:  Decreased range of motion, Impaired UE functional use, Increased muscle spasms, Pain, Improper body mechanics, Impaired flexibility, Decreased strength, Increased edema, Postural dysfunction  Visit Diagnosis: Stiffness of left shoulder, not elsewhere classified  Acute pain of left shoulder  Localized edema     Problem List Patient Active Problem List   Diagnosis Date Noted  . 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

## 2018-06-28 ENCOUNTER — Ambulatory Visit: Payer: Medicare Other | Admitting: Physical Therapy

## 2018-06-28 DIAGNOSIS — R600 Localized edema: Secondary | ICD-10-CM

## 2018-06-28 DIAGNOSIS — M25512 Pain in left shoulder: Secondary | ICD-10-CM | POA: Diagnosis not present

## 2018-06-28 DIAGNOSIS — M25612 Stiffness of left shoulder, not elsewhere classified: Secondary | ICD-10-CM

## 2018-06-28 NOTE — Therapy (Signed)
[facility] [place], [place], [address] Phone: [phone]   Fax:  [phone]  Physical Therapy Treatment  Patient Details  Name: Nicholas Terry MRN: [id number] Date of Birth: 13-Jul-1951 Referring Provider (PT): Supple   Encounter Date: 06/28/2018  PT End of Session - 06/28/18 1057    Visit Number  4    Date for PT Re-Evaluation  08/15/18    PT Start Time  3361    PT Stop Time  1112    PT Time Calculation (min)  57 min    Activity Tolerance  Patient tolerated treatment well    Behavior During Therapy  [facility] for tasks assessed/performed       Past Medical History:  Diagnosis Date  . Arthritis   . Complication of anesthesia   . DVT (deep venous thrombosis) (HCC)    bil calf  . Foot mass, left    left plantar forefoot  . History of kidney stones   . PONV (postoperative nausea and vomiting)   . Seasonal allergies     Past Surgical History:  Procedure Laterality Date  . JOINT REPLACEMENT Left    knee  . KIDNEY STONE SURGERY     x3  . KNEE ARTHROSCOPY Bilateral   . MASS EXCISION Left 03/11/2018   Procedure: Excisional biopsy of left forefoot mass;  Surgeon: Wylene Simmer, MD;  Location: [place];  Service: Orthopedics;  Laterality: Left;  60 mins  . SHOULDER ARTHROSCOPY Right   . TOTAL SHOULDER ARTHROPLASTY Left 05/27/2018  . TOTAL SHOULDER ARTHROPLASTY Left 05/27/2018   Procedure: LEFT TOTAL SHOULDER ARTHROPLASTY;  Surgeon: Justice Britain, MD;  Location: [place];  Service: Orthopedics;  Laterality: Left;  133min    There were no vitals filed for this visit.  Subjective Assessment - 06/28/18 1010    Subjective  "Im good man"    Currently in Pain?  No/denies    Pain Score  0-No pain                       [facility] Adult PT Treatment/Exercise - 06/28/18 0001      Exercises   Exercises  Shoulder      Shoulder Exercises: Supine   External Rotation  PROM;Left;20 reps    cane     Shoulder Exercises: Standing   Other Standing Exercises  pendulum x 2 min      Shoulder Exercises: Pulleys   Flexion  3 minutes      Shoulder Exercises: Isometric Strengthening   Flexion  5X10"    Extension  5X10"    External Rotation  5X10"    ABduction  5X10"      Modalities   Modalities  Vasopneumatic      Vasopneumatic   Number Minutes Vasopneumatic   15 minutes    Vasopnuematic Location   Shoulder    Vasopneumatic Pressure  Medium    Vasopneumatic Temperature   32      Manual Therapy   Manual Therapy  Passive ROM    Passive ROM  LUE all directions within protocol                PT Short Term Goals - 06/21/18 1110      PT SHORT TERM GOAL #1   Title  independent with initial HEP    Status  Achieved        PT Long Term Goals - 06/28/18 1057  PT LONG TERM GOAL #1   Title  understand and do RICE at home    Status  Achieved      PT LONG TERM GOAL #2   Title  dress without difficulty    Status  On-going      PT LONG TERM GOAL #3   Title  increase AROM to 120 degrees flexion    Status  On-going            Plan - 06/28/18 1059    Clinical Impression Statement  Pt continues to progress with MT, being less guarded. Attempted some AAROM but had to discontinue due to pain, despite being able to do it last treatment. No pain reported with Isometrics    PT Frequency  2x / week    PT Duration  8 weeks    PT Treatment/Interventions  ADLs/Self Care Home Management;Cryotherapy;Electrical Stimulation;Therapeutic activities;Functional mobility training;Patient/family education;Manual techniques;Passive range of motion;Vasopneumatic Device    PT Next Visit Plan  follow protocol, treat pain as needed       Patient will benefit from skilled therapeutic intervention in order to improve the following deficits and impairments:  Decreased range of motion, Impaired UE functional use, Increased muscle spasms, Pain, Improper body mechanics, Impaired  flexibility, Decreased strength, Increased edema, Postural dysfunction  Visit Diagnosis: Stiffness of left shoulder, not elsewhere classified  Acute pain of left shoulder  Localized edema     Problem List Patient Active Problem List   Diagnosis Date Noted  . 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

## 2018-07-01 ENCOUNTER — Ambulatory Visit: Payer: Medicare Other | Admitting: Physical Therapy

## 2018-07-01 ENCOUNTER — Encounter: Payer: Self-pay | Admitting: Physical Therapy

## 2018-07-01 DIAGNOSIS — M25512 Pain in left shoulder: Secondary | ICD-10-CM

## 2018-07-01 DIAGNOSIS — R600 Localized edema: Secondary | ICD-10-CM

## 2018-07-01 DIAGNOSIS — M25612 Stiffness of left shoulder, not elsewhere classified: Secondary | ICD-10-CM

## 2018-07-01 NOTE — Therapy (Signed)
[facility] [address] [place], [place], [address] Phone: [phone]   Fax:  [phone]  Physical Therapy Treatment  Patient Details  Name: Nicholas Terry MRN: [id number] Date of Birth: [birth year] Referring Provider (PT): Supple   Encounter Date: 07/01/2018  PT End of Session - 07/01/18 1047    Visit Number  5    Date for PT Re-Evaluation  08/15/18    PT Start Time  0630    PT Stop Time  1100    PT Time Calculation (min)  45 min    Activity Tolerance  Patient tolerated treatment well    Behavior During Therapy  [facility] for tasks assessed/performed       Past Medical History:  Diagnosis Date  . Arthritis   . Complication of anesthesia   . DVT (deep venous thrombosis) (HCC)    bil calf  . Foot mass, left    left plantar forefoot  . History of kidney stones   . PONV (postoperative nausea and vomiting)   . Seasonal allergies     Past Surgical History:  Procedure Laterality Date  . JOINT REPLACEMENT Left    knee  . KIDNEY STONE SURGERY     x3  . KNEE ARTHROSCOPY Bilateral   . MASS EXCISION Left 03/11/2018   Procedure: Excisional biopsy of left forefoot mass;  Surgeon: Wylene Simmer, MD;  Location: [place];  Service: Orthopedics;  Laterality: Left;  60 mins  . SHOULDER ARTHROSCOPY Right   . TOTAL SHOULDER ARTHROPLASTY Left 05/27/2018  . TOTAL SHOULDER ARTHROPLASTY Left 05/27/2018   Procedure: LEFT TOTAL SHOULDER ARTHROPLASTY;  Surgeon: Justice Britain, MD;  Location: [place];  Service: Orthopedics;  Laterality: Left;  181min    There were no vitals filed for this visit.  Subjective Assessment - 07/01/18 1018    Subjective  "Im actually hurting a little bit today, I think it is the weather, I dong usually hurt until I start stuff"    Currently in Pain?  Yes    Pain Location  Shoulder    Pain Orientation  Left                       [facility] Adult PT Treatment/Exercise - 07/01/18  0001      Shoulder Exercises: Standing   Other Standing Exercises  Ball on table PROM front and back, circles CW/CCW x20      Shoulder Exercises: Pulleys   Flexion  3 minutes      Modalities   Modalities  Vasopneumatic      Vasopneumatic   Number Minutes Vasopneumatic   15 minutes    Vasopnuematic Location   Shoulder    Vasopneumatic Pressure  Medium    Vasopneumatic Temperature   32      Manual Therapy   Manual Therapy  Passive ROM    Passive ROM  LUE all directions within protocol                PT Short Term Goals - 06/21/18 1110      PT SHORT TERM GOAL #1   Title  independent with initial HEP    Status  Achieved        PT Long Term Goals - 06/28/18 1057      PT LONG TERM GOAL #1   Title  understand and do RICE at home    Status  Achieved      PT LONG  TERM GOAL #2   Title  dress without difficulty    Status  On-going      PT LONG TERM GOAL #3   Title  increase AROM to 120 degrees flexion    Status  On-going            Plan - 07/01/18 1048    Clinical Impression Statement  Pt enters clinic reporting increase L shoulder pain. He suspects that's he may be doing too much at home. I informed him that he should not be using it at all when he is home. Increase pian with Pulley and ball on table. Pain also reported with MT when returning from ER. and when changing motions. All AROM within protocol.    Rehab Potential  Good    PT Frequency  2x / week    PT Duration  8 weeks    PT Treatment/Interventions  ADLs/Self Care Home Management;Cryotherapy;Electrical Stimulation;Therapeutic activities;Functional mobility training;Patient/family education;Manual techniques;Passive range of motion;Vasopneumatic Device    PT Next Visit Plan  follow protocol, treat pain as needed       Patient will benefit from skilled therapeutic intervention in order to improve the following deficits and impairments:  Decreased range of motion, Impaired UE functional use,  Increased muscle spasms, Pain, Improper body mechanics, Impaired flexibility, Decreased strength, Increased edema, Postural dysfunction  Visit Diagnosis: Stiffness of left shoulder, not elsewhere classified  Acute pain of left shoulder  Localized edema     Problem List Patient Active Problem List   Diagnosis Date Noted  . 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

## 2018-07-05 ENCOUNTER — Encounter: Payer: Self-pay | Admitting: Physical Therapy

## 2018-07-05 ENCOUNTER — Ambulatory Visit: Payer: Medicare Other | Admitting: Physical Therapy

## 2018-07-05 DIAGNOSIS — R600 Localized edema: Secondary | ICD-10-CM

## 2018-07-05 DIAGNOSIS — M25512 Pain in left shoulder: Secondary | ICD-10-CM | POA: Diagnosis not present

## 2018-07-05 DIAGNOSIS — M25612 Stiffness of left shoulder, not elsewhere classified: Secondary | ICD-10-CM

## 2018-07-05 NOTE — Therapy (Signed)
[facility] [address] [place], [place], [address] Phone: [phone]   Fax:  [phone]  Physical Therapy Treatment  Patient Details  Name: Nicholas Terry MRN: [id number] Date of Birth: [birth year] Referring Provider (PT): Supple   Encounter Date: 07/05/2018  PT End of Session - 07/05/18 1009    Visit Number  6    Date for PT Re-Evaluation  08/15/18    PT Start Time  0930    PT Stop Time  1024    PT Time Calculation (min)  54 min    Activity Tolerance  Patient tolerated treatment well    Behavior During Therapy  [facility] for tasks assessed/performed       Past Medical History:  Diagnosis Date  . Arthritis   . Complication of anesthesia   . DVT (deep venous thrombosis) (HCC)    bil calf  . Foot mass, left    left plantar forefoot  . History of kidney stones   . PONV (postoperative nausea and vomiting)   . Seasonal allergies     Past Surgical History:  Procedure Laterality Date  . JOINT REPLACEMENT Left    knee  . KIDNEY STONE SURGERY     x3  . KNEE ARTHROSCOPY Bilateral   . MASS EXCISION Left 03/11/2018   Procedure: Excisional biopsy of left forefoot mass;  Surgeon: Wylene Simmer, MD;  Location: [place];  Service: Orthopedics;  Laterality: Left;  60 mins  . SHOULDER ARTHROSCOPY Right   . TOTAL SHOULDER ARTHROPLASTY Left 05/27/2018  . TOTAL SHOULDER ARTHROPLASTY Left 05/27/2018   Procedure: LEFT TOTAL SHOULDER ARTHROPLASTY;  Surgeon: Justice Britain, MD;  Location: [place];  Service: Orthopedics;  Laterality: Left;  13min    There were no vitals filed for this visit.  Subjective Assessment - 07/05/18 0932    Subjective  "It is doing a lot better today"    Currently in Pain?  No/denies    Pain Score  0-No pain                       [facility] Adult PT Treatment/Exercise - 07/05/18 0001      Shoulder Exercises: Supine   Other Supine Exercises  rhynmic stabilization LUE      Shoulder Exercises: Standing   Flexion  AAROM;Both;10 reps   cane x2   Extension  AAROM;Both;20 reps   cane   Other Standing Exercises  IR 45 deg/ER 30deg AAROM 2x10     Other Standing Exercises  Ball on table PROM front and back, circles CW/CCW x20      Shoulder Exercises: ROM/Strengthening   UBE (Upper Arm Bike)  L1 x 3 min LUE float       Shoulder Exercises: Isometric Strengthening   Flexion  5X5"    Extension  5X5"      Modalities   Modalities  Vasopneumatic      Vasopneumatic   Number Minutes Vasopneumatic   15 minutes    Vasopnuematic Location   Shoulder    Vasopneumatic Pressure  Medium    Vasopneumatic Temperature   32      Manual Therapy   Manual Therapy  Passive ROM    Passive ROM  LUE all directions within protocol                PT Short Term Goals - 06/21/18 1110      PT SHORT TERM GOAL #1   Title  independent with initial HEP    Status  Achieved        PT Long Term Goals - 06/28/18 1057      PT LONG TERM GOAL #1   Title  understand and do RICE at home    Status  Achieved      PT LONG TERM GOAL #2   Title  dress without difficulty    Status  On-going      PT LONG TERM GOAL #3   Title  increase AROM to 120 degrees flexion    Status  On-going            Plan - 07/05/18 1009    Clinical Impression Statement  Pt did well with the new AAROM interventions. L shoulder was really tight limiting PROM. Pain noted with the excentric movements throughout. Did well with rhynmic stabilization.     Rehab Potential  Good    PT Frequency  2x / week    PT Duration  2 weeks    PT Treatment/Interventions  ADLs/Self Care Home Management;Cryotherapy;Electrical Stimulation;Therapeutic activities;Functional mobility training;Patient/family education;Manual techniques;Passive range of motion;Vasopneumatic Device    PT Next Visit Plan  follow protocol, treat pain as needed       Patient will benefit from skilled therapeutic intervention in order to  improve the following deficits and impairments:  Decreased range of motion, Impaired UE functional use, Increased muscle spasms, Pain, Improper body mechanics, Impaired flexibility, Decreased strength, Increased edema, Postural dysfunction  Visit Diagnosis: Acute pain of left shoulder  Localized edema  Stiffness of left shoulder, not elsewhere classified     Problem List Patient Active Problem List   Diagnosis Date Noted  . 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

## 2018-07-08 ENCOUNTER — Ambulatory Visit: Payer: Medicare Other | Admitting: Physical Therapy

## 2018-07-08 ENCOUNTER — Encounter: Payer: Self-pay | Admitting: Physical Therapy

## 2018-07-08 DIAGNOSIS — R600 Localized edema: Secondary | ICD-10-CM

## 2018-07-08 DIAGNOSIS — M25512 Pain in left shoulder: Secondary | ICD-10-CM | POA: Diagnosis not present

## 2018-07-08 DIAGNOSIS — M25612 Stiffness of left shoulder, not elsewhere classified: Secondary | ICD-10-CM

## 2018-07-08 NOTE — Therapy (Signed)
[place] [address], [place], [address] Phone: [phone]   Fax:  [phone]  Physical Therapy Treatment  Patient Details  Name: Nicholas Terry MRN: [id number] Date of Birth: 04/10/1951 Referring Provider (PT): Supple   Encounter Date: 07/08/2018  PT End of Session - 07/08/18 1146    Visit Number  7    Date for PT Re-Evaluation  08/15/18    PT Start Time  1100    PT Stop Time  1201    PT Time Calculation (min)  61 min    Activity Tolerance  Patient tolerated treatment well    Behavior During Therapy  [facility] for tasks assessed/performed       Past Medical History:  Diagnosis Date  . Arthritis   . Complication of anesthesia   . DVT (deep venous thrombosis) (HCC)    bil calf  . Foot mass, left    left plantar forefoot  . History of kidney stones   . PONV (postoperative nausea and vomiting)   . Seasonal allergies     Past Surgical History:  Procedure Laterality Date  . JOINT REPLACEMENT Left    knee  . KIDNEY STONE SURGERY     x3  . KNEE ARTHROSCOPY Bilateral   . MASS EXCISION Left 03/11/2018   Procedure: Excisional biopsy of left forefoot mass;  Surgeon: Wylene Simmer, MD;  Location: [place];  Service: Orthopedics;  Laterality: Left;  60 mins  . SHOULDER ARTHROSCOPY Right   . TOTAL SHOULDER ARTHROPLASTY Left 05/27/2018  . TOTAL SHOULDER ARTHROPLASTY Left 05/27/2018   Procedure: LEFT TOTAL SHOULDER ARTHROPLASTY;  Surgeon: Justice Britain, MD;  Location: [place];  Service: Orthopedics;  Laterality: Left;  185min    There were no vitals filed for this visit.  Subjective Assessment - 07/08/18 1106    Subjective  "Im ok, probable a little better than usual" "I did some yard work yesterday with my L arm"    Currently in Pain?  Yes    Pain Score  2     Pain Location  Shoulder    Pain Orientation  Left                       [facility] Adult PT Treatment/Exercise -  07/08/18 0001      Shoulder Exercises: Standing   Flexion  AAROM;Both;10 reps    Extension  AAROM;Both;20 reps   cane    Other Standing Exercises  Ball on wall flex AAROM 2x15      Shoulder Exercises: ROM/Strengthening   UBE (Upper Arm Bike)  L1 x 4 min LUE float       Modalities   Modalities  Vasopneumatic      Vasopneumatic   Number Minutes Vasopneumatic   15 minutes    Vasopnuematic Location   Shoulder    Vasopneumatic Pressure  Medium    Vasopneumatic Temperature   32      Manual Therapy   Manual Therapy  Passive ROM;Joint mobilization    Joint Mobilization  grade 1 all directions    Passive ROM  LUE all directions within protocol                PT Short Term Goals - 06/21/18 1110      PT SHORT TERM GOAL #1   Title  independent with initial HEP    Status  Achieved        PT Long Term  Goals - 06/28/18 1057      PT LONG TERM GOAL #1   Title  understand and do RICE at home    Status  Achieved      PT LONG TERM GOAL #2   Title  dress without difficulty    Status  On-going      PT LONG TERM GOAL #3   Title  increase AROM to 120 degrees flexion    Status  On-going            Plan - 07/08/18 1148    Clinical Impression Statement  Slight progression with AAROM, attempted standing passive abduction with cane but had to discontinues due to pain. L shoulder is really restricted, density felt in L anterior delt into the pec area. ER causes the most pain. Difficulty with pt relaxing during MT, once pt relaxed a noticeable gain in PROM is noticed.  Positive response to distraction.     Rehab Potential  Good    PT Frequency  2x / week    PT Duration  2 weeks    PT Treatment/Interventions  ADLs/Self Care Home Management;Cryotherapy;Electrical Stimulation;Therapeutic activities;Functional mobility training;Patient/family education;Manual techniques;Passive range of motion;Vasopneumatic Device    PT Next Visit Plan  follow protocol, treat pain as needed        Patient will benefit from skilled therapeutic intervention in order to improve the following deficits and impairments:  Decreased range of motion, Impaired UE functional use, Increased muscle spasms, Pain, Improper body mechanics, Impaired flexibility, Decreased strength, Increased edema, Postural dysfunction  Visit Diagnosis: Acute pain of left shoulder  Localized edema  Stiffness of left shoulder, not elsewhere classified     Problem List Patient Active Problem List   Diagnosis Date Noted  . 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

## 2018-07-12 ENCOUNTER — Ambulatory Visit: Payer: Medicare Other | Admitting: Physical Therapy

## 2018-07-12 ENCOUNTER — Encounter: Payer: Self-pay | Admitting: Physical Therapy

## 2018-07-12 DIAGNOSIS — R600 Localized edema: Secondary | ICD-10-CM

## 2018-07-12 DIAGNOSIS — M25512 Pain in left shoulder: Secondary | ICD-10-CM

## 2018-07-12 DIAGNOSIS — M25612 Stiffness of left shoulder, not elsewhere classified: Secondary | ICD-10-CM

## 2018-07-12 NOTE — Therapy (Signed)
[place] [address] [place], [place], [address] Phone: [phone]   Fax:  [phone]  Physical Therapy Treatment  Patient Details  Name: Nicholas Terry MRN: [id number] Date of Birth: Jan 24, 1951 Referring Provider (PT): Supple   Encounter Date: 07/12/2018  PT End of Session - 07/12/18 1010    Visit Number  8    Date for PT Re-Evaluation  08/15/18    PT Start Time  0930    PT Stop Time  1025    PT Time Calculation (min)  55 min    Activity Tolerance  Patient tolerated treatment well    Behavior During Therapy  [facility] for tasks assessed/performed       Past Medical History:  Diagnosis Date  . Arthritis   . Complication of anesthesia   . DVT (deep venous thrombosis) (HCC)    bil calf  . Foot mass, left    left plantar forefoot  . History of kidney stones   . PONV (postoperative nausea and vomiting)   . Seasonal allergies     Past Surgical History:  Procedure Laterality Date  . JOINT REPLACEMENT Left    knee  . KIDNEY STONE SURGERY     x3  . KNEE ARTHROSCOPY Bilateral   . MASS EXCISION Left 03/11/2018   Procedure: Excisional biopsy of left forefoot mass;  Surgeon: Wylene Simmer, MD;  Location: [place];  Service: Orthopedics;  Laterality: Left;  60 mins  . SHOULDER ARTHROSCOPY Right   . TOTAL SHOULDER ARTHROPLASTY Left 05/27/2018  . TOTAL SHOULDER ARTHROPLASTY Left 05/27/2018   Procedure: LEFT TOTAL SHOULDER ARTHROPLASTY;  Surgeon: Justice Britain, MD;  Location: [place];  Service: Orthopedics;  Laterality: Left;  152min    There were no vitals filed for this visit.  Subjective Assessment - 07/12/18 0938    Subjective  "I am stiff this morning"    Currently in Pain?  Yes    Pain Score  2                        [facility] Adult PT Treatment/Exercise - 07/12/18 0001      Shoulder Exercises: Standing   Flexion  AAROM;Both;15 reps;12 reps    Extension  AAROM;Both;20 reps    Other Standing Exercises  Ball on wall flex AAROM x15, x10    Other Standing Exercises  ladder flex 2x10 therapist mod assit on descends       Shoulder Exercises: ROM/Strengthening   UBE (Upper Arm Bike)  L1 x 6 min LUE float       Modalities   Modalities  Vasopneumatic      Vasopneumatic   Number Minutes Vasopneumatic   15 minutes    Vasopnuematic Location   Shoulder    Vasopneumatic Pressure  Medium    Vasopneumatic Temperature   32      Manual Therapy   Manual Therapy  Passive ROM;Joint mobilization    Manual therapy comments  pt very limited and guarded     Joint Mobilization  grade 1 all directions    Passive ROM  LUE all directions within protocol                PT Short Term Goals - 06/21/18 1110      PT SHORT TERM GOAL #1   Title  independent with initial HEP    Status  Achieved        PT Long Term  Goals - 06/28/18 1057      PT LONG TERM GOAL #1   Title  understand and do RICE at home    Status  Achieved      PT LONG TERM GOAL #2   Title  dress without difficulty    Status  On-going      PT LONG TERM GOAL #3   Title  increase AROM to 120 degrees flexion    Status  On-going            Plan - 07/12/18 1010    Clinical Impression Statement  Pt L deltoid and pec remain very tight and dense with palpation. L shoulder ROM is very limited and reports pain at the end range of all motions except extension. Guarded with MT, constant cues to relax. Some improvement with motion when pt actually relaxes.     Rehab Potential  Good    PT Frequency  2x / week    PT Duration  2 weeks    PT Treatment/Interventions  ADLs/Self Care Home Management;Cryotherapy;Electrical Stimulation;Therapeutic activities;Functional mobility training;Patient/family education;Manual techniques;Passive range of motion;Vasopneumatic Device    PT Next Visit Plan  follow protocol, treat pain as needed       Patient will benefit from skilled therapeutic intervention in order to  improve the following deficits and impairments:  Decreased range of motion, Impaired UE functional use, Increased muscle spasms, Pain, Improper body mechanics, Impaired flexibility, Decreased strength, Increased edema, Postural dysfunction  Visit Diagnosis: Acute pain of left shoulder  Localized edema  Stiffness of left shoulder, not elsewhere classified     Problem List Patient Active Problem List   Diagnosis Date Noted  . 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

## 2018-07-15 ENCOUNTER — Ambulatory Visit: Payer: Medicare Other | Admitting: Physical Therapy

## 2018-07-15 ENCOUNTER — Encounter: Payer: Self-pay | Admitting: Physical Therapy

## 2018-07-15 DIAGNOSIS — M25512 Pain in left shoulder: Secondary | ICD-10-CM | POA: Diagnosis not present

## 2018-07-15 DIAGNOSIS — M25612 Stiffness of left shoulder, not elsewhere classified: Secondary | ICD-10-CM

## 2018-07-15 DIAGNOSIS — R600 Localized edema: Secondary | ICD-10-CM

## 2018-07-15 NOTE — Therapy (Signed)
[place], [place], [address] Phone: [phone]   Fax:  [phone]  Physical Therapy Treatment  Patient Details  Name: Nicholas Terry MRN: [id number] Date of Birth: 1951-06-15 Referring Provider (PT): Supple   Encounter Date: 07/15/2018  PT End of Session - 07/15/18 1142    Visit Number  9    Date for PT Re-Evaluation  08/15/18    PT Start Time  1055    PT Stop Time  1157    PT Time Calculation (min)  62 min    Activity Tolerance  Patient tolerated treatment well;Patient limited by pain    Behavior During Therapy  [facility] for tasks assessed/performed       Past Medical History:  Diagnosis Date  . Arthritis   . Complication of anesthesia   . DVT (deep venous thrombosis) (HCC)    bil calf  . Foot mass, left    left plantar forefoot  . History of kidney stones   . PONV (postoperative nausea and vomiting)   . Seasonal allergies     Past Surgical History:  Procedure Laterality Date  . JOINT REPLACEMENT Left    knee  . KIDNEY STONE SURGERY     x3  . KNEE ARTHROSCOPY Bilateral   . MASS EXCISION Left 03/11/2018   Procedure: Excisional biopsy of left forefoot mass;  Surgeon: Wylene Simmer, MD;  Location: [place];  Service: Orthopedics;  Laterality: Left;  60 mins  . SHOULDER ARTHROSCOPY Right   . TOTAL SHOULDER ARTHROPLASTY Left 05/27/2018  . TOTAL SHOULDER ARTHROPLASTY Left 05/27/2018   Procedure: LEFT TOTAL SHOULDER ARTHROPLASTY;  Surgeon: Justice Britain, MD;  Location: [place];  Service: Orthopedics;  Laterality: Left;  132min    There were no vitals filed for this visit.  Subjective Assessment - 07/15/18 1055    Subjective  "I am a little stiff,  I did my stretches this morning"    Currently in Pain?  Yes    Pain Score  2     Pain Location  Shoulder    Pain Orientation  Left                       [facility] Adult PT Treatment/Exercise - 07/15/18 0001       Shoulder Exercises: Supine   Flexion  20 reps   60-80deg   Other Supine Exercises  rhynmic stabilization& circles  LUE      Shoulder Exercises: Prone   Other Prone Exercises  LUE extensions x10    Other Prone Exercises   LUE rows 2x10      Shoulder Exercises: Sidelying   External Rotation  Left;20 reps;AROM      Shoulder Exercises: Standing   Flexion  AAROM;Both;10 reps    Extension  AAROM;Both;20 reps    Other Standing Exercises  Ball on wall flex AAROM x15, x10    Other Standing Exercises  ladder flex 2x5 therapist mod assit on descends       Shoulder Exercises: ROM/Strengthening   UBE (Upper Arm Bike)  L1 x 6 min LUE float       Modalities   Modalities  Vasopneumatic      Vasopneumatic   Number Minutes Vasopneumatic   154 minutes    Vasopnuematic Location   Shoulder    Vasopneumatic Pressure  Medium    Vasopneumatic Temperature   32      Manual Therapy   Manual  Therapy  Passive ROM;Joint mobilization;Muscle Energy Technique    Joint Mobilization  grade 1 all directions    Passive ROM  LUE all directions within protocol     Muscle Energy Technique  contract relax flex               PT Short Term Goals - 06/21/18 1110      PT SHORT TERM GOAL #1   Title  independent with initial HEP    Status  Achieved        PT Long Term Goals - 06/28/18 1057      PT LONG TERM GOAL #1   Title  understand and do RICE at home    Status  Achieved      PT LONG TERM GOAL #2   Title  dress without difficulty    Status  On-going      PT LONG TERM GOAL #3   Title  increase AROM to 120 degrees flexion    Status  On-going            Plan - 07/15/18 1143    Clinical Impression Statement  Pt did well with a slight progression with active movements. Needed assist to get arm in appropriate placement for rhymnic stabilization ans circles. Pt did well with prone rows and extensions. Pt L shoulder ROM remains limited due to tightness and pain. L shoulder compensated  with both IE/ER.. Some improvement with contract relax but had increase pain. Little ROM with side lying ER.     PT Frequency  2x / week    PT Duration  2 weeks    PT Treatment/Interventions  ADLs/Self Care Home Management;Cryotherapy;Electrical Stimulation;Therapeutic activities;Functional mobility training;Patient/family education;Manual techniques;Passive range of motion;Vasopneumatic Device    PT Next Visit Plan  follow protocol, treat pain as needed       Patient will benefit from skilled therapeutic intervention in order to improve the following deficits and impairments:  Decreased range of motion, Impaired UE functional use, Increased muscle spasms, Pain, Improper body mechanics, Impaired flexibility, Decreased strength, Increased edema, Postural dysfunction  Visit Diagnosis: Acute pain of left shoulder  Stiffness of left shoulder, not elsewhere classified  Localized edema     Problem List Patient Active Problem List   Diagnosis Date Noted  . 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

## 2018-07-19 ENCOUNTER — Ambulatory Visit: Payer: Medicare Other | Admitting: Physical Therapy

## 2018-07-19 ENCOUNTER — Encounter: Payer: Self-pay | Admitting: Physical Therapy

## 2018-07-19 DIAGNOSIS — R600 Localized edema: Secondary | ICD-10-CM

## 2018-07-19 DIAGNOSIS — M25612 Stiffness of left shoulder, not elsewhere classified: Secondary | ICD-10-CM

## 2018-07-19 DIAGNOSIS — M25512 Pain in left shoulder: Secondary | ICD-10-CM

## 2018-07-19 NOTE — Therapy (Signed)
[place] [address] [place], [place], [address] Phone: [phone]   Fax:  [phone] Progress Note Reporting Period 06/15/2018 to 07/19/18 for the first 10 visits   See note below for Objective Data and Assessment of Progress/Goals.      Physical Therapy Treatment  Patient Details  Name: Nicholas Terry MRN: [id number] Date of Birth: 01-29-1951 Referring Provider (PT): Supple   Encounter Date: 07/19/2018  PT End of Session - 07/19/18 1019    Visit Number  10    Date for PT Re-Evaluation  08/15/18    PT Start Time  0930    PT Stop Time  1033    PT Time Calculation (min)  63 min    Activity Tolerance  Patient tolerated treatment well;Patient limited by pain    Behavior During Therapy  [facility] for tasks assessed/performed       Past Medical History:  Diagnosis Date  . Arthritis   . Complication of anesthesia   . DVT (deep venous thrombosis) (HCC)    bil calf  . Foot mass, left    left plantar forefoot  . History of kidney stones   . PONV (postoperative nausea and vomiting)   . Seasonal allergies     Past Surgical History:  Procedure Laterality Date  . JOINT REPLACEMENT Left    knee  . KIDNEY STONE SURGERY     x3  . KNEE ARTHROSCOPY Bilateral   . MASS EXCISION Left 03/11/2018   Procedure: Excisional biopsy of left forefoot mass;  Surgeon: Wylene Simmer, MD;  Location: [place];  Service: Orthopedics;  Laterality: Left;  60 mins  . SHOULDER ARTHROSCOPY Right   . TOTAL SHOULDER ARTHROPLASTY Left 05/27/2018  . TOTAL SHOULDER ARTHROPLASTY Left 05/27/2018   Procedure: LEFT TOTAL SHOULDER ARTHROPLASTY;  Surgeon: Justice Britain, MD;  Location: [place];  Service: Orthopedics;  Laterality: Left;  180min    There were no vitals filed for this visit.  Subjective Assessment - 07/19/18 0935    Subjective  Pt reports that he has been replicating some of the exercises at home.    Currently in  Pain?  Yes    Pain Score  2     Pain Location  Shoulder    Pain Orientation  Left                       [facility] Adult PT Treatment/Exercise - 07/19/18 0001      Shoulder Exercises: Supine   Flexion  5 reps;AROM    Other Supine Exercises  iso hold in flex LUE 5x5''    Other Supine Exercises  rhynmic stabilization& circles  LUE      Shoulder Exercises: Prone   Other Prone Exercises  LUE extensions 2x10    Other Prone Exercises   LUE rows 1lb  2x10      Shoulder Exercises: Sidelying   External Rotation  Left;20 reps;AROM    Flexion  AROM;Left;10 reps;5 reps      Shoulder Exercises: Standing   Flexion  AAROM;Both;20 reps   cane     Shoulder Exercises: Pulleys   Flexion  3 minutes      Shoulder Exercises: ROM/Strengthening   UBE (Upper Arm Bike)  L1 x 6 min LUE float       Modalities   Modalities  Vasopneumatic      Vasopneumatic   Number Minutes Vasopneumatic   15 minutes    Vasopnuematic  Location   Shoulder    Vasopneumatic Pressure  Medium    Vasopneumatic Temperature   32      Manual Therapy   Manual Therapy  Passive ROM    Passive ROM  LUE mostly ER/IR with shoulder abd               PT Short Term Goals - 06/21/18 1110      PT SHORT TERM GOAL #1   Title  independent with initial HEP    Status  Achieved        PT Long Term Goals - 06/28/18 1057      PT LONG TERM GOAL #1   Title  understand and do RICE at home    Status  Achieved      PT LONG TERM GOAL #2   Title  dress without difficulty    Status  On-going      PT LONG TERM GOAL #3   Title  increase AROM to 120 degrees flexion    Status  On-going            Plan - 07/19/18 1024    Clinical Impression Statement  more active treatment, encouraging pt to push ROM. Assist needed to support LUE with side lying flexion. Pain at the end range of all motions passive or active. Did well with prone rows and extensions. L shoulder ROM remains very limited.    PT  Treatment/Interventions  ADLs/Self Care Home Management;Cryotherapy;Electrical Stimulation;Therapeutic activities;Functional mobility training;Patient/family education;Manual techniques;Passive range of motion;Vasopneumatic Device    PT Next Visit Plan  follow protocol, treat pain as needed       Patient will benefit from skilled therapeutic intervention in order to improve the following deficits and impairments:  Decreased range of motion, Impaired UE functional use, Increased muscle spasms, Pain, Improper body mechanics, Impaired flexibility, Decreased strength, Increased edema, Postural dysfunction  Visit Diagnosis: Acute pain of left shoulder  Localized edema  Stiffness of left shoulder, not elsewhere classified     Problem List Patient Active Problem List   Diagnosis Date Noted  . 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

## 2018-07-22 ENCOUNTER — Encounter: Payer: Self-pay | Admitting: Physical Therapy

## 2018-07-22 ENCOUNTER — Ambulatory Visit: Payer: Medicare Other | Attending: Orthopedic Surgery | Admitting: Physical Therapy

## 2018-07-22 DIAGNOSIS — R600 Localized edema: Secondary | ICD-10-CM

## 2018-07-22 DIAGNOSIS — M25512 Pain in left shoulder: Secondary | ICD-10-CM | POA: Diagnosis not present

## 2018-07-22 DIAGNOSIS — M25612 Stiffness of left shoulder, not elsewhere classified: Secondary | ICD-10-CM | POA: Diagnosis present

## 2018-07-22 NOTE — Therapy (Signed)
[place], [place], [address] Phone: [phone]   Fax:  [phone]  Physical Therapy Treatment  Patient Details  Name: Nicholas Terry MRN: [id number] Date of Birth: 11-29-1950 Referring Provider (PT): Supple   Encounter Date: 07/22/2018  PT End of Session - 07/22/18 1018    PT Start Time  0930    PT Stop Time  1028    PT Time Calculation (min)  58 min    Activity Tolerance  Patient tolerated treatment well;Patient limited by pain    Behavior During Therapy  [facility] for tasks assessed/performed       Past Medical History:  Diagnosis Date  . Arthritis   . Complication of anesthesia   . DVT (deep venous thrombosis) (HCC)    bil calf  . Foot mass, left    left plantar forefoot  . History of kidney stones   . PONV (postoperative nausea and vomiting)   . Seasonal allergies     Past Surgical History:  Procedure Laterality Date  . JOINT REPLACEMENT Left    knee  . KIDNEY STONE SURGERY     x3  . KNEE ARTHROSCOPY Bilateral   . MASS EXCISION Left 03/11/2018   Procedure: Excisional biopsy of left forefoot mass;  Surgeon: Wylene Simmer, MD;  Location: [place];  Service: Orthopedics;  Laterality: Left;  60 mins  . SHOULDER ARTHROSCOPY Right   . TOTAL SHOULDER ARTHROPLASTY Left 05/27/2018  . TOTAL SHOULDER ARTHROPLASTY Left 05/27/2018   Procedure: LEFT TOTAL SHOULDER ARTHROPLASTY;  Surgeon: Justice Britain, MD;  Location: [place];  Service: Orthopedics;  Laterality: Left;  171mn    There were no vitals filed for this visit.  Subjective Assessment - 07/22/18 0932    Subjective  "Pretty good"    Currently in Pain?  No/denies                       [facility]/Ex[facility] - 07/22/18 0001      Shoulder Exercises: Prone   Other Prone Exercises  LUE extensions 1lb 2x10    Other Prone Exercises   LUE rows 1lb  2x10      Shoulder Exercises: Standing   External Rotation  Strengthening;Left;20 reps;Theraband    Theraband Level (Shoulder External Rotation)  Level 1 (Yellow)    Internal Rotation  Theraband;20 reps;Left;Strengthening    EHaematologistTheraband;20 reps;Both    Theraband Level (Shoulder Extension)  Level 1 (Yellow)    Row  Strengthening;20 reps;Theraband    Theraband Level (Shoulder Row)  Level 1 (Yellow)    Other Standing Exercises  Tricept ext 15lb 2x10. bicep curls 10lb 2x15       Shoulder Exercises: ROM/Strengthening   UBE (Upper Arm Bike)  L1 x 6 min LUE float       Modalities   Modalities  Vasopneumatic      Vasopneumatic   Number Minutes Vasopneumatic   15 minutes    Vasopnuematic Location   Shoulder    Vasopneumatic Pressure  Medium    Vasopneumatic Temperature   32      Manual Therapy   Manual Therapy  Passive ROM    Manual therapy comments  pt very guarded at times     Passive ROM  LUE all directions within protocol                PT Short Term Goals - 06/21/18 1110  PT SHORT TERM GOAL #1   Title  independent with initial HEP    Status  Achieved        PT Long Term Goals - 07/22/18 1018      PT LONG TERM GOAL #1   Title  understand and do RICE at home      PT LONG TERM GOAL #2   Title  dress without difficulty    Status  Partially Met      PT LONG TERM GOAL #3   Title  increase AROM to 120 degrees flexion    Status  On-going      PT LONG TERM GOAL #4   Title  reach into cabinet with 3#    Status  On-going            Plan - 07/22/18 1025    Clinical Impression Statement  Added more low resistance interventions. Postural cues needed with standing rows and extensions. Bicep and triceps done on pulley with the assist from RUE. Pt remains very limited with pot passive and active ROM. He is very guarded with MT.     Rehab Potential  Good    PT Frequency  2x / week    PT Duration  2 weeks    PT Treatment/Interventions  ADLs/Self Care Home  Management;Cryotherapy;Electrical Stimulation;Therapeutic activities;Functional mobility training;Patient/family education;Manual techniques;Passive range of motion;Vasopneumatic Device    PT Next Visit Plan  follow protocol, treat pain as needed       Patient will benefit from skilled therapeutic intervention in order to improve the following deficits and impairments:  Decreased range of motion, Impaired UE functional use, Increased muscle spasms, Pain, Improper body mechanics, Impaired flexibility, Decreased strength, Increased edema, Postural dysfunction  Visit Diagnosis: Acute pain of left shoulder  Localized edema  Stiffness of left shoulder, not elsewhere classified     Problem List Patient Active Problem List   Diagnosis Date Noted  . 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

## 2018-07-26 ENCOUNTER — Encounter: Payer: Self-pay | Admitting: Physical Therapy

## 2018-07-26 ENCOUNTER — Ambulatory Visit: Payer: Medicare Other | Admitting: Physical Therapy

## 2018-07-26 DIAGNOSIS — M25512 Pain in left shoulder: Secondary | ICD-10-CM

## 2018-07-26 DIAGNOSIS — M25612 Stiffness of left shoulder, not elsewhere classified: Secondary | ICD-10-CM

## 2018-07-26 DIAGNOSIS — R600 Localized edema: Secondary | ICD-10-CM

## 2018-07-26 NOTE — Therapy (Signed)
[place], [place], [address] Phone: [phone]   Fax:  [phone]  Physical Therapy Treatment  Patient Details  Name: Nicholas Terry MRN: [id number] Date of Birth: 05-16-1951 Referring Provider (PT): Supple   Encounter Date: 07/26/2018  PT End of Session - 07/26/18 1349    Visit Number  11    Date for PT Re-Evaluation  08/15/18    PT Start Time  1300    PT Stop Time  1401    PT Time Calculation (min)  61 min    Activity Tolerance  Patient tolerated treatment well    Behavior During Therapy  [facility] for tasks assessed/performed       Past Medical History:  Diagnosis Date  . Arthritis   . Complication of anesthesia   . DVT (deep venous thrombosis) (HCC)    bil calf  . Foot mass, left    left plantar forefoot  . History of kidney stones   . PONV (postoperative nausea and vomiting)   . Seasonal allergies     Past Surgical History:  Procedure Laterality Date  . JOINT REPLACEMENT Left    knee  . KIDNEY STONE SURGERY     x3  . KNEE ARTHROSCOPY Bilateral   . MASS EXCISION Left 03/11/2018   Procedure: Excisional biopsy of left forefoot mass;  Surgeon: Wylene Simmer, MD;  Location: [place];  Service: Orthopedics;  Laterality: Left;  60 mins  . SHOULDER ARTHROSCOPY Right   . TOTAL SHOULDER ARTHROPLASTY Left 05/27/2018  . TOTAL SHOULDER ARTHROPLASTY Left 05/27/2018   Procedure: LEFT TOTAL SHOULDER ARTHROPLASTY;  Surgeon: Justice Britain, MD;  Location: [place];  Service: Orthopedics;  Laterality: Left;  122mn    There were no vitals filed for this visit.  Subjective Assessment - 07/26/18 1304    Subjective  "Im looser in the afternoon"    Currently in Pain?  Yes    Pain Score  1     Pain Location  Shoulder    Pain Orientation  Left                       [facility] Adult PT Treatment/Exercise - 07/26/18 0001      Shoulder Exercises: Prone   Other Prone  Exercises  LUE extensions 1lb 2x10, LUE flexion 2x10, LUE abd  x10     Other Prone Exercises   LUE rows 1lb  2x10      Shoulder Exercises: Standing   External Rotation  Strengthening;Left;20 reps;Theraband    Theraband Level (Shoulder External Rotation)  Level 1 (Yellow)    Internal Rotation  Theraband;20 reps;Left;Strengthening    Theraband Level (Shoulder Internal Rotation)  Level 1 (Yellow)    Extension  Strengthening;Theraband;20 reps;Both    Theraband Level (Shoulder Extension)  Level 1 (Yellow)    Row  Strengthening;20 reps;Theraband    Theraband Level (Shoulder Row)  Level 1 (Yellow)    Other Standing Exercises  Tricept ext 20lb 2x10. bicep curls 10lb 2x15     Other Standing Exercises  flex up wall with cane x10       Shoulder Exercises: Pulleys   Flexion  3 minutes      Shoulder Exercises: ROM/Strengthening   UBE (Upper Arm Bike)  L1 x 6 min LUE       Shoulder Exercises: Stretch   Other Shoulder Stretches  IR towel stretch 2x10       Vasopneumatic  Number Minutes Vasopneumatic   15 minutes    Vasopnuematic Location   Shoulder    Vasopneumatic Pressure  Medium    Vasopneumatic Temperature   32               PT Short Term Goals - 06/21/18 1110      PT SHORT TERM GOAL #1   Title  independent with initial HEP    Status  Achieved        PT Long Term Goals - 07/22/18 1018      PT LONG TERM GOAL #1   Title  understand and do RICE at home      PT LONG TERM GOAL #2   Title  dress without difficulty    Status  Partially Met      PT LONG TERM GOAL #3   Title  increase AROM to 120 degrees flexion    Status  On-going      PT LONG TERM GOAL #4   Title  reach into cabinet with 3#    Status  On-going            Plan - 07/26/18 1350    Clinical Impression Statement  All active and AAROM interventions performed today. Good effort throughout treatment session. Pt L shoulder ROM is very limited in all directions. Does well with rows and extension.      Rehab Potential  Good    PT Frequency  2x / week    PT Duration  2 weeks    PT Treatment/Interventions  ADLs/Self Care Home Management;Cryotherapy;Electrical Stimulation;Therapeutic activities;Functional mobility training;Patient/family education;Manual techniques;Passive range of motion;Vasopneumatic Device    PT Next Visit Plan  follow protocol, treat pain as needed       Patient will benefit from skilled therapeutic intervention in order to improve the following deficits and impairments:  Decreased range of motion, Impaired UE functional use, Increased muscle spasms, Pain, Improper body mechanics, Impaired flexibility, Decreased strength, Increased edema, Postural dysfunction  Visit Diagnosis: Acute pain of left shoulder  Localized edema  Stiffness of left shoulder, not elsewhere classified     Problem List Patient Active Problem List   Diagnosis Date Noted  . 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

## 2018-07-29 ENCOUNTER — Encounter: Payer: Self-pay | Admitting: Physical Therapy

## 2018-07-29 ENCOUNTER — Ambulatory Visit: Payer: Medicare Other | Admitting: Physical Therapy

## 2018-07-29 DIAGNOSIS — M25612 Stiffness of left shoulder, not elsewhere classified: Secondary | ICD-10-CM

## 2018-07-29 DIAGNOSIS — R600 Localized edema: Secondary | ICD-10-CM

## 2018-07-29 DIAGNOSIS — M25512 Pain in left shoulder: Secondary | ICD-10-CM

## 2018-07-29 NOTE — Therapy (Signed)
[place], [place], [address] Phone: [phone]   Fax:  [phone]  Physical Therapy Treatment  Patient Details  Name: Nicholas Terry MRN: [id number] Date of Birth: 05/12/1951 Referring Provider (PT): Supple   Encounter Date: 07/29/2018  PT End of Session - 07/29/18 1344    Visit Number  12    Date for PT Re-Evaluation  08/15/18    PT Start Time  1300    PT Stop Time  1356    PT Time Calculation (min)  56 min       Past Medical History:  Diagnosis Date  . Arthritis   . Complication of anesthesia   . DVT (deep venous thrombosis) (HCC)    bil calf  . Foot mass, left    left plantar forefoot  . History of kidney stones   . PONV (postoperative nausea and vomiting)   . Seasonal allergies     Past Surgical History:  Procedure Laterality Date  . JOINT REPLACEMENT Left    knee  . KIDNEY STONE SURGERY     x3  . KNEE ARTHROSCOPY Bilateral   . MASS EXCISION Left 03/11/2018   Procedure: Excisional biopsy of left forefoot mass;  Surgeon: Wylene Simmer, MD;  Location: [facility];  Service: Orthopedics;  Laterality: Left;  60 mins  . SHOULDER ARTHROSCOPY Right   . TOTAL SHOULDER ARTHROPLASTY Left 05/27/2018  . TOTAL SHOULDER ARTHROPLASTY Left 05/27/2018   Procedure: LEFT TOTAL SHOULDER ARTHROPLASTY;  Surgeon: Justice Britain, MD;  Location: [place];  Service: Orthopedics;  Laterality: Left;  18mn    There were no vitals filed for this visit.  Subjective Assessment - 07/29/18 1304    Subjective  "Pretty good"    Currently in Pain?  Yes    Pain Score  1          [facility] PT Assessment - 07/29/18 0001      PROM   Left Shoulder Flexion  78 Degrees    Left Shoulder ABduction  47 Degrees                   [facility] Adult PT Treatment/Exercise - 07/29/18 0001      Shoulder Exercises: Prone   Other Prone Exercises  LUE flex,ext,abd 2x10     Other Prone Exercises   LUE  rows 3lb  2x10      Shoulder Exercises: Standing   External Rotation  Strengthening;Left;20 reps;Theraband    Theraband Level (Shoulder External Rotation)  Level 2 (Red)    Internal Rotation  Theraband;20 reps;Left;Strengthening    Theraband Level (Shoulder Internal Rotation)  Level 2 (Red)    Flexion  AROM;20 reps    Extension  Strengthening;Theraband;20 reps;Both    Theraband Level (Shoulder Extension)  Level 2 (Red)    Row  Strengthening;20 reps;Theraband    Theraband Level (Shoulder Row)  Level 2 (Red)    Other Standing Exercises  Tricept ext 20lb 2x15. bicep curls 10lb 2x15       Shoulder Exercises: Pulleys   Flexion  --   4 min    Scaption  2 minutes      Shoulder Exercises: ROM/Strengthening   UBE (Upper Arm Bike)  L1 x 6 min LUE       Vasopneumatic   Number Minutes Vasopneumatic   15 minutes    Vasopnuematic Location   Shoulder    Vasopneumatic Pressure  Medium    Vasopneumatic Temperature  32      Manual Therapy   Manual Therapy  Passive ROM    Manual therapy comments  pt very guarded at times     Joint Mobilization  grade 1 all directions    Passive ROM  LUE all directions within protocol                PT Short Term Goals - 06/21/18 1110      PT SHORT TERM GOAL #1   Title  independent with initial HEP    Status  Achieved        PT Long Term Goals - 07/22/18 1018      PT LONG TERM GOAL #1   Title  understand and do RICE at home      PT [place] #2   Title  dress without difficulty    Status  Partially Met      PT LONG TERM GOAL #3   Title  increase AROM to 120 degrees flexion    Status  On-going      PT LONG TERM GOAL #4   Title  reach into cabinet with 3#    Status  On-going            Plan - 07/29/18 1344    Clinical Impression Statement  L shoulder ROM remains limited. Good effort with all exercise interventions. Pain with MT at end range. Dense muscle tissue noted in L deltoid and pec.     PT Treatment/Interventions   ADLs/Self Care Home Management;Cryotherapy;Electrical Stimulation;Therapeutic activities;Functional mobility training;Patient/family education;Manual techniques;Passive range of motion;Vasopneumatic Device    PT Next Visit Plan  follow protocol, treat pain as needed       Patient will benefit from skilled therapeutic intervention in order to improve the following deficits and impairments:  Decreased range of motion, Impaired UE functional use, Increased muscle spasms, Pain, Improper body mechanics, Impaired flexibility, Decreased strength, Increased edema, Postural dysfunction  Visit Diagnosis: Acute pain of left shoulder  Localized edema  Stiffness of left shoulder, not elsewhere classified     Problem List Patient Active Problem List   Diagnosis Date Noted  . 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

## 2018-08-03 ENCOUNTER — Ambulatory Visit: Payer: Medicare Other | Admitting: Physical Therapy

## 2018-08-03 ENCOUNTER — Encounter: Payer: Self-pay | Admitting: Physical Therapy

## 2018-08-03 DIAGNOSIS — M25512 Pain in left shoulder: Secondary | ICD-10-CM

## 2018-08-03 DIAGNOSIS — M25612 Stiffness of left shoulder, not elsewhere classified: Secondary | ICD-10-CM

## 2018-08-03 DIAGNOSIS — R600 Localized edema: Secondary | ICD-10-CM

## 2018-08-03 NOTE — Therapy (Signed)
[place], [place], [address] Phone: [phone]   Fax:  [phone]  Physical Therapy Treatment  Patient Details  Name: Nicholas Terry MRN: [id number] Date of Birth: [birth year] Referring Provider (PT): Supple   Encounter Date: 08/03/2018  PT End of Session - 08/03/18 1341    Visit Number  13    Date for PT Re-Evaluation  08/15/18    PT Start Time  1300    PT Stop Time  1356    PT Time Calculation (min)  56 min    Activity Tolerance  Patient tolerated treatment well    Behavior During Therapy  [facility] for tasks assessed/performed       Past Medical History:  Diagnosis Date  . Arthritis   . Complication of anesthesia   . DVT (deep venous thrombosis) (HCC)    bil calf  . Foot mass, left    left plantar forefoot  . History of kidney stones   . PONV (postoperative nausea and vomiting)   . Seasonal allergies     Past Surgical History:  Procedure Laterality Date  . JOINT REPLACEMENT Left    knee  . KIDNEY STONE SURGERY     x3  . KNEE ARTHROSCOPY Bilateral   . MASS EXCISION Left 03/11/2018   Procedure: Excisional biopsy of left forefoot mass;  Surgeon: Wylene Simmer, MD;  Location: [place];  Service: Orthopedics;  Laterality: Left;  60 mins  . SHOULDER ARTHROSCOPY Right   . TOTAL SHOULDER ARTHROPLASTY Left 05/27/2018  . TOTAL SHOULDER ARTHROPLASTY Left 05/27/2018   Procedure: LEFT TOTAL SHOULDER ARTHROPLASTY;  Surgeon: Justice Britain, MD;  Location: [place];  Service: Orthopedics;  Laterality: Left;  119mn    There were no vitals filed for this visit.  Subjective Assessment - 08/03/18 1302    Subjective  "Pretty good" "I did a little something at home this morning"    Currently in Pain?  No/denies                       [facility] - 08/03/18 0001      Shoulder Exercises: Standing   External Rotation  Strengthening;Left;20  reps;Theraband    Theraband Level (Shoulder External Rotation)  Level 2 (Red)    Internal Rotation  Theraband;20 reps;Left;Strengthening    Theraband Level (Shoulder Internal Rotation)  Level 2 (Red)    Flexion  AROM;20 reps    Extension  Strengthening;Theraband;Both;15 reps   x2   Theraband Level (Shoulder Extension)  Level 3 (Green)    Row  SApache CorporationTheraband   x2   Theraband Level (Shoulder Row)  Level 3 (Green)    Other Standing Exercises  LUE flex up wall pillow case 2x10-       Shoulder Exercises: ROM/Strengthening   UBE (Upper Arm Bike)  L1 x 6 min LUE       Vasopneumatic   Number Minutes Vasopneumatic   15 minutes    Vasopnuematic Location   Shoulder    Vasopneumatic Pressure  Medium    Vasopneumatic Temperature   32      Manual Therapy   Manual Therapy  Passive ROM    Manual therapy comments  pt very guarded at times     Joint Mobilization  grade 1 all directions    Passive ROM  LUE all directions within protocol  PT Short Term Goals - 06/21/18 1110      PT SHORT TERM GOAL #1   Title  independent with initial HEP    Status  Achieved        PT Long Term Goals - 07/22/18 1018      PT LONG TERM GOAL #1   Title  understand and do RICE at home      PT LONG TERM GOAL #2   Title  dress without difficulty    Status  Partially Met      PT LONG TERM GOAL #3   Title  increase AROM to 120 degrees flexion    Status  On-going      PT LONG TERM GOAL #4   Title  reach into cabinet with 3#    Status  On-going            Plan - 08/03/18 1342    Clinical Impression Statement  Does well with resistance exercises with his available ROM. Overall L shoulder ROM remains very limited. Pt did state that his arm did not move as good as it does now before surgery despite limitations. Sub scap, deltoid. and pec tightness noted during PROM.     Rehab Potential  Good    PT Frequency  2x / week    PT Duration  2 weeks    PT  Treatment/Interventions  ADLs/Self Care Home Management;Cryotherapy;Electrical Stimulation;Therapeutic activities;Functional mobility training;Patient/family education;Manual techniques;Passive range of motion;Vasopneumatic Device    PT Next Visit Plan  follow protocol, treat pain as needed       Patient will benefit from skilled therapeutic intervention in order to improve the following deficits and impairments:  Decreased range of motion, Impaired UE functional use, Increased muscle spasms, Pain, Improper body mechanics, Impaired flexibility, Decreased strength, Increased edema, Postural dysfunction  Visit Diagnosis: Acute pain of left shoulder  Localized edema  Stiffness of left shoulder, not elsewhere classified     Problem List Patient Active Problem List   Diagnosis Date Noted  . 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

## 2018-08-05 ENCOUNTER — Ambulatory Visit: Payer: Medicare Other | Admitting: Physical Therapy

## 2018-08-05 ENCOUNTER — Encounter: Payer: Self-pay | Admitting: Physical Therapy

## 2018-08-05 DIAGNOSIS — M25512 Pain in left shoulder: Secondary | ICD-10-CM | POA: Diagnosis not present

## 2018-08-05 DIAGNOSIS — R600 Localized edema: Secondary | ICD-10-CM

## 2018-08-05 DIAGNOSIS — M25612 Stiffness of left shoulder, not elsewhere classified: Secondary | ICD-10-CM

## 2018-08-05 NOTE — Therapy (Signed)
[place], [place], [address] Phone: [phone]   Fax:  [phone]  Physical Therapy Treatment  Patient Details  Name: Nicholas Terry MRN: [id number] Date of Birth: 09-26-1950 Referring Provider (PT): Supple   Encounter Date: 08/05/2018  PT End of Session - 08/05/18 1352    Visit Number  14    Date for PT Re-Evaluation  08/15/18    PT Start Time  1300    PT Stop Time  1402    PT Time Calculation (min)  62 min    Activity Tolerance  Patient tolerated treatment well    Behavior During Therapy  [facility] for tasks assessed/performed       Past Medical History:  Diagnosis Date  . Arthritis   . Complication of anesthesia   . DVT (deep venous thrombosis) (HCC)    bil calf  . Foot mass, left    left plantar forefoot  . History of kidney stones   . PONV (postoperative nausea and vomiting)   . Seasonal allergies     Past Surgical History:  Procedure Laterality Date  . JOINT REPLACEMENT Left    knee  . KIDNEY STONE SURGERY     x3  . KNEE ARTHROSCOPY Bilateral   . MASS EXCISION Left 03/11/2018   Procedure: Excisional biopsy of left forefoot mass;  Surgeon: Wylene Simmer, MD;  Location: [place];  Service: Orthopedics;  Laterality: Left;  60 mins  . SHOULDER ARTHROSCOPY Right   . TOTAL SHOULDER ARTHROPLASTY Left 05/27/2018  . TOTAL SHOULDER ARTHROPLASTY Left 05/27/2018   Procedure: LEFT TOTAL SHOULDER ARTHROPLASTY;  Surgeon: Justice Britain, MD;  Location: [place];  Service: Orthopedics;  Laterality: Left;  165mn    There were no vitals filed for this visit.  Subjective Assessment - 08/05/18 1301    Subjective  "I did a little workout this morning" "Pretty good, kinda stiff the last couple of days"    Currently in Pain?  No/denies                       [facility]/Exercise - 08/05/18 0001      Shoulder Exercises: Prone   Other Prone Exercises  LUE  flex,ext,abd 2x10     Other Prone Exercises   LUE rows 3lb  2x10      Shoulder Exercises: Standing   Extension  Strengthening;Theraband;Both;15 reps    Theraband Level (Shoulder Extension)  Level 3 (Green)    Row  SApache CorporationTheraband   x2   Theraband Level (Shoulder Row)  Level 3 (Green)    Other Standing Exercises  Tricept ext 20lb 2x15. bicep curls 10lb 2x15       Shoulder Exercises: ROM/Strengthening   UBE (Upper Arm Bike)  L2 x 3 min each way     Other ROM/Strengthening Exercises  15lb Rows 2x10, Lats x10       Shoulder Exercises: Isometric Strengthening   Flexion  5X5"    Extension  5X5"    ABduction  5X5"      Vasopneumatic   Number Minutes Vasopneumatic   15 minutes    Vasopnuematic Location   Shoulder    Vasopneumatic Pressure  Medium    Vasopneumatic Temperature   32      Manual Therapy   Manual Therapy  Passive ROM    Manual therapy comments  pt very guarded at times     Joint Mobilization  grade 2-3 all directions    Soft tissue mobilization  L deltoid into subcap    Passive ROM  LUE all directions within protocol     Muscle Energy Technique  contract relax flexion               PT Short Term Goals - 06/21/18 1110      PT SHORT TERM GOAL #1   Title  independent with initial HEP    Status  Achieved        PT Long Term Goals - 07/22/18 1018      PT LONG TERM GOAL #1   Title  understand and do RICE at home      PT LONG TERM GOAL #2   Title  dress without difficulty    Status  Partially Met      PT LONG TERM GOAL #3   Title  increase AROM to 120 degrees flexion    Status  On-going      PT LONG TERM GOAL #4   Title  reach into cabinet with 3#    Status  On-going            Plan - 08/05/18 1353    Clinical Impression Statement  Good effort with all interventions. Tactile cues to scapula with prone rolls to ensure appropriate movement. Some pain reported with lat pull downs with limited ROM. Cues needed to keep shoulders  back with bicep curls. L should jt capsule tight with inferior glided. Again tightness in pec, deltoid, and subscapular.    Rehab Potential  Good    PT Frequency  2x / week    PT Duration  2 weeks    PT Next Visit Plan  follow protocol, treat pain as needed       Patient will benefit from skilled therapeutic intervention in order to improve the following deficits and impairments:  Decreased range of motion, Impaired UE functional use, Increased muscle spasms, Pain, Improper body mechanics, Impaired flexibility, Decreased strength, Increased edema, Postural dysfunction  Visit Diagnosis: Acute pain of left shoulder  Localized edema  Stiffness of left shoulder, not elsewhere classified     Problem List Patient Active Problem List   Diagnosis Date Noted  . 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

## 2018-08-10 ENCOUNTER — Ambulatory Visit: Payer: Medicare Other | Admitting: Physical Therapy

## 2018-08-10 ENCOUNTER — Encounter: Payer: Medicare Other | Admitting: Physical Therapy

## 2018-08-10 DIAGNOSIS — M25612 Stiffness of left shoulder, not elsewhere classified: Secondary | ICD-10-CM

## 2018-08-10 DIAGNOSIS — R600 Localized edema: Secondary | ICD-10-CM

## 2018-08-10 DIAGNOSIS — M25512 Pain in left shoulder: Secondary | ICD-10-CM | POA: Diagnosis not present

## 2018-08-10 NOTE — Therapy (Signed)
[place], [place], [address] Phone: [phone]   Fax:  [phone]  Physical Therapy Treatment  Patient Details  Name: Nicholas Terry MRN: [id number] Date of Birth: [birth year] Referring Provider (PT): Supple   Encounter Date: 08/10/2018  PT End of Session - 08/10/18 1007    Visit Number  15    Date for PT Re-Evaluation  08/15/18    PT Start Time  0925    PT Stop Time  1025    PT Time Calculation (min)  60 min       Past Medical History:  Diagnosis Date  . Arthritis   . Complication of anesthesia   . DVT (deep venous thrombosis) (HCC)    bil calf  . Foot mass, left    left plantar forefoot  . History of kidney stones   . PONV (postoperative nausea and vomiting)   . Seasonal allergies     Past Surgical History:  Procedure Laterality Date  . JOINT REPLACEMENT Left    knee  . KIDNEY STONE SURGERY     x3  . KNEE ARTHROSCOPY Bilateral   . MASS EXCISION Left 03/11/2018   Procedure: Excisional biopsy of left forefoot mass;  Surgeon: Wylene Simmer, MD;  Location: [place];  Service: Orthopedics;  Laterality: Left;  60 mins  . SHOULDER ARTHROSCOPY Right   . TOTAL SHOULDER ARTHROPLASTY Left 05/27/2018  . TOTAL SHOULDER ARTHROPLASTY Left 05/27/2018   Procedure: LEFT TOTAL SHOULDER ARTHROPLASTY;  Surgeon: Justice Britain, MD;  Location: [place];  Service: Orthopedics;  Laterality: Left;  125mn    There were no vitals filed for this visit.  Subjective Assessment - 08/10/18 0928    Subjective  always stiffer in the morning and with cold weather. afternoons are better    Currently in Pain?  Yes    Pain Score  2     Pain Location  Shoulder    Pain Orientation  Left                       [facility] Adult PT Treatment/Exercise - 08/10/18 0001      Shoulder Exercises: Seated   Extension  Strengthening;20 reps;Weights;Both    Extension Weight (lbs)  1    Row   Strengthening;Both;20 reps;Weights    Row Weight (lbs)  1    External Rotation  Strengthening;20 reps;Theraband;Left    Theraband Level (Shoulder External Rotation)  Level 1 (Yellow)    Internal Rotation  Strengthening;Theraband;20 reps;Left    Theraband Level (Shoulder Internal Rotation)  Level 1 (Yellow)    Abduction  Left;20 reps   ISOMETRIC     Shoulder Exercises: Standing   Extension  Strengthening;Theraband;Both;15 reps   2 sets   Theraband Level (Shoulder Extension)  Level 3 (Green)    RMedical sales representativeTheraband;15 reps   2 sets   Theraband Level (Shoulder Row)  Level 3 (Green)    Other Standing Exercises  Tricept ext 20lb 2x15. bicep curls 10lb 2x15     Other Standing Exercises  wall ladder flexion 8 times      Shoulder Exercises: ROM/Strengthening   UBE (Upper Arm Bike)  L3 x 3 min each way       Modalities   Modalities  Vasopneumatic      Vasopneumatic   Number Minutes Vasopneumatic   15 minutes    Vasopnuematic Location   Shoulder    Vasopneumatic Pressure  Medium  Vasopneumatic Temperature   32      Manual Therapy   Manual Therapy  Passive ROM    Manual therapy comments  pt very guarded and resistive to PROM    Joint Mobilization  grade 2-3 all directions    Passive ROM  LUE all directions within protocol     Muscle Energy Technique  contract relax flexion               PT Short Term Goals - 06/21/18 1110      PT SHORT TERM GOAL #1   Title  independent with initial HEP    Status  Achieved        PT Long Term Goals - 07/22/18 1018      PT LONG TERM GOAL #1   Title  understand and do RICE at home      PT [place] #2   Title  dress without difficulty    Status  Partially Met      PT LONG TERM GOAL #3   Title  increase AROM to 120 degrees flexion    Status  On-going      PT LONG TERM GOAL #4   Title  reach into cabinet with 3#    Status  On-going            Plan - 08/10/18 1008    Clinical Impression Statement  pt  with good effort but needed cuing for psotural compensations. very guarded and tight with mvmt esp AA and PROM ( pt verb he will try a muscle relaxer before next session).     PT Treatment/Interventions  ADLs/Self Care Home Management;Cryotherapy;Electrical Stimulation;Therapeutic activities;Functional mobility training;Patient/family education;Manual techniques;Passive range of motion;Vasopneumatic Device    PT Next Visit Plan  follow protocol, treat pain as needed . next session check goals and ROM . Write MD note       Patient will benefit from skilled therapeutic intervention in order to improve the following deficits and impairments:  Decreased range of motion, Impaired UE functional use, Increased muscle spasms, Pain, Improper body mechanics, Impaired flexibility, Decreased strength, Increased edema, Postural dysfunction  Visit Diagnosis: Localized edema  Acute pain of left shoulder  Stiffness of left shoulder, not elsewhere classified     Problem List Patient Active Problem List   Diagnosis Date Noted  . 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

## 2018-08-12 ENCOUNTER — Encounter: Payer: Self-pay | Admitting: Physical Therapy

## 2018-08-12 ENCOUNTER — Ambulatory Visit: Payer: Medicare Other | Admitting: Physical Therapy

## 2018-08-12 DIAGNOSIS — M25512 Pain in left shoulder: Secondary | ICD-10-CM | POA: Diagnosis not present

## 2018-08-12 DIAGNOSIS — R600 Localized edema: Secondary | ICD-10-CM

## 2018-08-12 DIAGNOSIS — M25612 Stiffness of left shoulder, not elsewhere classified: Secondary | ICD-10-CM

## 2018-08-12 NOTE — Therapy (Signed)
[place], [place], [address] Phone: [phone]   Fax:  [phone]  Physical Therapy Treatment  Patient Details  Name: Nicholas Terry MRN: [id number] Date of Birth: 1951-02-24 Referring Provider (PT): Supple   Encounter Date: 08/12/2018  PT End of Session - 08/12/18 1350    Visit Number  16    Date for PT Re-Evaluation  08/15/18    PT Start Time  1300    PT Stop Time  1401    PT Time Calculation (min)  61 min    Activity Tolerance  Patient tolerated treatment well    Behavior During Therapy  [facility] for tasks assessed/performed       Past Medical History:  Diagnosis Date  . Arthritis   . Complication of anesthesia   . DVT (deep venous thrombosis) (HCC)    bil calf  . Foot mass, left    left plantar forefoot  . History of kidney stones   . PONV (postoperative nausea and vomiting)   . Seasonal allergies     Past Surgical History:  Procedure Laterality Date  . JOINT REPLACEMENT Left    knee  . KIDNEY STONE SURGERY     x3  . KNEE ARTHROSCOPY Bilateral   . MASS EXCISION Left 03/11/2018   Procedure: Excisional biopsy of left forefoot mass;  Surgeon: Wylene Simmer, MD;  Location: [place];  Service: Orthopedics;  Laterality: Left;  60 mins  . SHOULDER ARTHROSCOPY Right   . TOTAL SHOULDER ARTHROPLASTY Left 05/27/2018  . TOTAL SHOULDER ARTHROPLASTY Left 05/27/2018   Procedure: LEFT TOTAL SHOULDER ARTHROPLASTY;  Surgeon: Justice Britain, MD;  Location: [place];  Service: Orthopedics;  Laterality: Left;  164mn    There were no vitals filed for this visit.  Subjective Assessment - 08/12/18 1301    Subjective  "Pretty good, these cold days aren't sitting well"    Currently in Pain?  No/denies         [facility] - 08/12/18 0001      ROM / Strength   AROM / PROM / Strength  AROM;PROM      AROM   AROM Assessment Site  Shoulder    Right/Left Shoulder  Left    Left  Shoulder Flexion  78 Degrees    Left Shoulder ABduction  54 Degrees    Left Shoulder Internal Rotation  60 Degrees    Left Shoulder External Rotation  30 Degrees      PROM   Left Shoulder Flexion  105 Degrees    Left Shoulder ABduction  91 Degrees    Left Shoulder Internal Rotation  72 Degrees    Left Shoulder External Rotation  40 Degrees                   [facility] Adult PT Treatment/Exercise - 08/12/18 0001      Shoulder Exercises: Seated   Extension  Strengthening;20 reps;Weights;Both    Extension Weight (lbs)  2    Row  Strengthening;Both;20 reps;Weights    Row Weight (lbs)  2      Shoulder Exercises: Prone   Other Prone Exercises  LUE flex, ext, 2lb, abd 2x15    Other Prone Exercises   LUE rows 3lb  2x10      Shoulder Exercises: Standing   Other Standing Exercises  wall ladder flexion 8 times      Shoulder Exercises: ROM/Strengthening   UBE (Upper Arm Bike)  L3 x 3 min each way     Other ROM/Strengthening Exercises  15lb Rows 2x10, Lats       Modalities   Modalities  Vasopneumatic      Vasopneumatic   Number Minutes Vasopneumatic   15 minutes    Vasopnuematic Location   Shoulder    Vasopneumatic Pressure  Medium      Manual Therapy   Manual Therapy  Passive ROM    Manual therapy comments  pt very guarded and resistive to PROM    Joint Mobilization  grade 2-3 all directions    Passive ROM  LUE all directions within protocol     Muscle Energy Technique  contract relax flexion               PT Short Term Goals - 06/21/18 1110      PT SHORT TERM GOAL #1   Title  independent with initial HEP    Status  Achieved        PT Long Term Goals - 07/22/18 1018      PT LONG TERM GOAL #1   Title  understand and do RICE at home      PT [place] #2   Title  dress without difficulty    Status  Partially Met      PT LONG TERM GOAL #3   Title  increase AROM to 120 degrees flexion    Status  On-going      PT LONG TERM GOAL #4   Title   reach into cabinet with 3#    Status  On-going            Plan - 08/12/18 1351    Clinical Impression Statement  Pt continues to out forth a good effort in therapy, postural compensation noted due to decrease L shoulder ROM. Active and passive ROM with some improvement but still very limited. Difficulty keeping arm straight with flexion.      Rehab Potential  Good    PT Frequency  2x / week    PT Treatment/Interventions  ADLs/Self Care Home Management;Cryotherapy;Electrical Stimulation;Therapeutic activities;Functional mobility training;Patient/family education;Manual techniques;Passive range of motion;Vasopneumatic Device    PT Next Visit Plan  follow protocol, treat pain as needed . next session check goals and ROM . Write MD note       Patient will benefit from skilled therapeutic intervention in order to improve the following deficits and impairments:  Decreased range of motion, Impaired UE functional use, Increased muscle spasms, Pain, Improper body mechanics, Impaired flexibility, Decreased strength, Increased edema, Postural dysfunction  Visit Diagnosis: Localized edema  Acute pain of left shoulder  Stiffness of left shoulder, not elsewhere classified     Problem List Patient Active Problem List   Diagnosis Date Noted  . 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

## 2018-08-17 ENCOUNTER — Encounter: Payer: Self-pay | Admitting: Physical Therapy

## 2018-08-17 ENCOUNTER — Ambulatory Visit: Payer: Medicare Other | Admitting: Physical Therapy

## 2018-08-17 DIAGNOSIS — M25512 Pain in left shoulder: Secondary | ICD-10-CM

## 2018-08-17 DIAGNOSIS — R600 Localized edema: Secondary | ICD-10-CM

## 2018-08-17 DIAGNOSIS — M25612 Stiffness of left shoulder, not elsewhere classified: Secondary | ICD-10-CM

## 2018-08-17 NOTE — Therapy (Signed)
[place], [place], [address] Phone: [phone]   Fax:  [phone]  Physical Therapy Treatment  Patient Details  Name: Carlus Stay MRN: [id number] Date of Birth: March 10, 1951 Referring Provider (PT): Supple   Encounter Date: 08/17/2018  PT End of Session - 08/17/18 1341    Visit Number  17    Date for PT Re-Evaluation  08/15/18    PT Start Time  1300    PT Stop Time  1357    PT Time Calculation (min)  57 min    Activity Tolerance  Patient tolerated treatment well    Behavior During Therapy  [facility] for tasks assessed/performed       Past Medical History:  Diagnosis Date  . Arthritis   . Complication of anesthesia   . DVT (deep venous thrombosis) (HCC)    bil calf  . Foot mass, left    left plantar forefoot  . History of kidney stones   . PONV (postoperative nausea and vomiting)   . Seasonal allergies     Past Surgical History:  Procedure Laterality Date  . JOINT REPLACEMENT Left    knee  . KIDNEY STONE SURGERY     x3  . KNEE ARTHROSCOPY Bilateral   . MASS EXCISION Left 03/11/2018   Procedure: Excisional biopsy of left forefoot mass;  Surgeon: Wylene Simmer, MD;  Location: [place];  Service: Orthopedics;  Laterality: Left;  60 mins  . SHOULDER ARTHROSCOPY Right   . TOTAL SHOULDER ARTHROPLASTY Left 05/27/2018  . TOTAL SHOULDER ARTHROPLASTY Left 05/27/2018   Procedure: LEFT TOTAL SHOULDER ARTHROPLASTY;  Surgeon: Justice Britain, MD;  Location: [place];  Service: Orthopedics;  Laterality: Left;  162mn    There were no vitals filed for this visit.  Subjective Assessment - 08/17/18 1302    Subjective  "The doctor was not unhappy with it" Pt reports that he feels good    Currently in Pain?  Yes    Pain Score  2     Pain Location  Arm    Pain Orientation  Right                       [facility] Adult PT Treatment/Exercise - 08/17/18 0001      Shoulder  Exercises: Seated   Other Seated Exercises  bent over rows 3lb, ext 3lb , rev flys 1lb  2x15      Shoulder Exercises: Prone   Other Prone Exercises  LUE flex, ext 2lb, abd 2x15    Other Prone Exercises   LUE rows 5lb  2x15      Shoulder Exercises: Standing   Extension  Strengthening;Theraband;Both;15 reps   x2   Theraband Level (Shoulder Extension)  Level 3 (Green)    RMedical sales representativeTheraband;15 reps   x2   Theraband Level (Shoulder Row)  Level 3 (Green)    Other Standing Exercises  wall ladder flexion 8 times      Shoulder Exercises: ROM/Strengthening   UBE (Upper Arm Bike)  L3 x 3 min each way     Other ROM/Strengthening Exercises  20lb Rows 3x10       Shoulder Exercises: Isometric Strengthening   ABduction  5X5"      Modalities   Modalities  Vasopneumatic      Vasopneumatic   Number Minutes Vasopneumatic   15 minutes    Vasopnuematic Location   Shoulder    Vasopneumatic Pressure  Medium    Vasopneumatic Temperature   32      Manual Therapy   Manual Therapy  Passive ROM    Manual therapy comments  pt very guarded and resistive to PROM    Joint Mobilization  grade 2-3 all directions    Passive ROM  LUE all directions within protocol                PT Short Term Goals - 06/21/18 1110      PT SHORT TERM GOAL #1   Title  independent with initial HEP    Status  Achieved        PT Long Term Goals - 07/22/18 1018      PT LONG TERM GOAL #1   Title  understand and do RICE at home      PT [place] #2   Title  dress without difficulty    Status  Partially Met      PT LONG TERM GOAL #3   Title  increase AROM to 120 degrees flexion    Status  On-going      PT LONG TERM GOAL #4   Title  reach into cabinet with 3#    Status  On-going            Plan - 08/17/18 1342    Clinical Impression Statement  postural compensation remains due to decrease L shoulder ROM. Progressed to some bent over exercises, reports some pain at end range. Cues not  to use momentum with active prone flexion and abduction. Tactile cues needed with seated rows. Pain at end range of MT. Tightness in surrounding shoulder muscles noted with PROM.    Rehab Potential  Good    PT Frequency  2x / week    PT Duration  2 weeks    PT Treatment/Interventions  ADLs/Self Care Home Management;Cryotherapy;Electrical Stimulation;Therapeutic activities;Functional mobility training;Patient/family education;Manual techniques;Passive range of motion;Vasopneumatic Device    PT Next Visit Plan  follow protocol, treat pain as needed . next session check goals and ROM . Write MD note       Patient will benefit from skilled therapeutic intervention in order to improve the following deficits and impairments:  Decreased range of motion, Impaired UE functional use, Increased muscle spasms, Pain, Improper body mechanics, Impaired flexibility, Decreased strength, Increased edema, Postural dysfunction  Visit Diagnosis: Localized edema  Acute pain of left shoulder  Stiffness of left shoulder, not elsewhere classified     Problem List Patient Active Problem List   Diagnosis Date Noted  . 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

## 2018-08-18 NOTE — Addendum Note (Signed)
Addended by: Sumner Boast on: 08/18/2018 08:14 AM   Modules accepted: Orders

## 2018-08-24 ENCOUNTER — Encounter: Payer: Self-pay | Admitting: Physical Therapy

## 2018-08-24 ENCOUNTER — Ambulatory Visit: Payer: Medicare Other | Attending: Orthopedic Surgery | Admitting: Physical Therapy

## 2018-08-24 DIAGNOSIS — M25512 Pain in left shoulder: Secondary | ICD-10-CM

## 2018-08-24 DIAGNOSIS — R600 Localized edema: Secondary | ICD-10-CM | POA: Diagnosis not present

## 2018-08-24 DIAGNOSIS — M25612 Stiffness of left shoulder, not elsewhere classified: Secondary | ICD-10-CM | POA: Diagnosis present

## 2018-08-24 NOTE — Therapy (Signed)
[place], [place], [address] Phone: [phone]   Fax:  [phone]  Physical Therapy Treatment  Patient Details  Name: Nicholas Terry MRN: [id number] Date of Birth: Oct 02, 1950 Referring Provider (PT): Supple   Encounter Date: 08/24/2018  PT End of Session - 08/24/18 1341    Visit Number  18    PT Start Time  1300    PT Stop Time  6712    PT Time Calculation (min)  55 min    Activity Tolerance  Patient tolerated treatment well    Behavior During Therapy  [facility] for tasks assessed/performed       Past Medical History:  Diagnosis Date  . Arthritis   . Complication of anesthesia   . DVT (deep venous thrombosis) (HCC)    bil calf  . Foot mass, left    left plantar forefoot  . History of kidney stones   . PONV (postoperative nausea and vomiting)   . Seasonal allergies     Past Surgical History:  Procedure Laterality Date  . JOINT REPLACEMENT Left    knee  . KIDNEY STONE SURGERY     x3  . KNEE ARTHROSCOPY Bilateral   . MASS EXCISION Left 03/11/2018   Procedure: Excisional biopsy of left forefoot mass;  Surgeon: Wylene Simmer, MD;  Location: [place];  Service: Orthopedics;  Laterality: Left;  60 mins  . SHOULDER ARTHROSCOPY Right   . TOTAL SHOULDER ARTHROPLASTY Left 05/27/2018  . TOTAL SHOULDER ARTHROPLASTY Left 05/27/2018   Procedure: LEFT TOTAL SHOULDER ARTHROPLASTY;  Surgeon: Justice Britain, MD;  Location: [place];  Service: Orthopedics;  Laterality: Left;  143min    There were no vitals filed for this visit.  Subjective Assessment - 08/24/18 1259    Subjective  "Pretty good, I am a little sore, I think it is because Im more comfortable sleeping"    Currently in Pain?  Yes    Pain Score  1     Pain Location  Shoulder    Pain Orientation  Right;Left                       [facility] Adult PT Treatment/Exercise - 08/24/18 0001      Shoulder Exercises:  Standing   Flexion  AROM;20 reps    ABduction  Both;20 reps;AROM   decrease ROM both UE,L shoulder elevation    Extension  Strengthening;Both;20 reps;Weights    Extension Weight (lbs)  5 10    Row  Strengthening;Theraband;15 reps   x2   Theraband Level (Shoulder Row)  Level 4 (Blue)    Other Standing Exercises  Tricept ext 25lb 2x15. bicep curls 10lb 2x15     Other Standing Exercises  blue tband ext 2x15      Shoulder Exercises: ROM/Strengthening   UBE (Upper Arm Bike)  constant work 15 watts 3 min each way     Other ROM/Strengthening Exercises  20lb Rows 2x15       Modalities   Modalities  Vasopneumatic      Vasopneumatic   Number Minutes Vasopneumatic   15 minutes    Vasopnuematic Location   Shoulder    Vasopneumatic Pressure  Medium    Vasopneumatic Temperature   32      Manual Therapy   Manual Therapy  Passive ROM    Manual therapy comments  pt very guarded and resistive to PROM    Joint Mobilization  grade  2-3 all directions    Soft tissue mobilization  L deltoid into subcap    Passive ROM  LUE all directions within protocol                PT Short Term Goals - 06/21/18 1110      PT SHORT TERM GOAL #1   Title  independent with initial HEP    Status  Achieved        PT Long Term Goals - 08/24/18 1342      PT LONG TERM GOAL #1   Title  understand and do RICE at home    Status  Achieved      PT LONG TERM GOAL #2   Title  dress without difficulty    Status  Achieved      PT LONG TERM GOAL #3   Title  increase AROM to 120 degrees flexion    Status  On-going      PT LONG TERM GOAL #4   Title  reach into cabinet with 3#    Status  On-going      PT LONG TERM GOAL #5   Title  increase AROM of ER to 60 degrees    Status  On-going            Plan - 08/24/18 1343    Clinical Impression Statement  Pt is progressing well with strength exercises. Increase weight tolerated with standing rows and extensions. Pt able to progress to machine level  shoulder extensions. Cues needed to prevent postural compensation with shoulder extension on machine. Both shoulder ROM limited with abduction., L shoulder compensated with elevation.     PT Frequency  2x / week    PT Duration  2 weeks    PT Treatment/Interventions  Joint Manipulations    PT Next Visit Plan  follow protocol, treat pain as needed, Check ROM       Patient will benefit from skilled therapeutic intervention in order to improve the following deficits and impairments:  Decreased range of motion, Impaired UE functional use, Increased muscle spasms, Pain, Improper body mechanics, Impaired flexibility, Decreased strength, Increased edema, Postural dysfunction  Visit Diagnosis: Localized edema  Acute pain of left shoulder  Stiffness of left shoulder, not elsewhere classified     Problem List Patient Active Problem List   Diagnosis Date Noted  . 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

## 2018-08-26 ENCOUNTER — Encounter: Payer: Self-pay | Admitting: Physical Therapy

## 2018-08-26 ENCOUNTER — Ambulatory Visit: Payer: Medicare Other | Admitting: Physical Therapy

## 2018-08-26 DIAGNOSIS — R600 Localized edema: Secondary | ICD-10-CM

## 2018-08-26 DIAGNOSIS — M25512 Pain in left shoulder: Secondary | ICD-10-CM

## 2018-08-26 DIAGNOSIS — M25612 Stiffness of left shoulder, not elsewhere classified: Secondary | ICD-10-CM

## 2018-08-26 NOTE — Therapy (Signed)
[place], [place], [address] Phone: [phone]   Fax:  [phone]  Physical Therapy Treatment  Patient Details  Name: Nicholas Terry MRN: [id number] Date of Birth: [birth year] Referring Provider (PT): Supple   Encounter Date: 08/26/2018  PT End of Session - 08/26/18 1344    Visit Number  19    Date for PT Re-Evaluation  09/18/18    PT Start Time  1300    PT Stop Time  1359    PT Time Calculation (min)  59 min    Activity Tolerance  Patient tolerated treatment well    Behavior During Therapy  [facility] for tasks assessed/performed       Past Medical History:  Diagnosis Date  . Arthritis   . Complication of anesthesia   . DVT (deep venous thrombosis) (HCC)    bil calf  . Foot mass, left    left plantar forefoot  . History of kidney stones   . PONV (postoperative nausea and vomiting)   . Seasonal allergies     Past Surgical History:  Procedure Laterality Date  . JOINT REPLACEMENT Left    knee  . KIDNEY STONE SURGERY     x3  . KNEE ARTHROSCOPY Bilateral   . MASS EXCISION Left 03/11/2018   Procedure: Excisional biopsy of left forefoot mass;  Surgeon: Wylene Simmer, MD;  Location: [place];  Service: Orthopedics;  Laterality: Left;  60 mins  . SHOULDER ARTHROSCOPY Right   . TOTAL SHOULDER ARTHROPLASTY Left 05/27/2018  . TOTAL SHOULDER ARTHROPLASTY Left 05/27/2018   Procedure: LEFT TOTAL SHOULDER ARTHROPLASTY;  Surgeon: Justice Britain, MD;  Location: [place];  Service: Orthopedics;  Laterality: Left;  127min    There were no vitals filed for this visit.  Subjective Assessment - 08/26/18 1303    Subjective  "I am a little sore, Im sure it is the weather"    Currently in Pain?  Yes    Pain Score  1     Pain Location  Shoulder    Pain Orientation  Left;Right                       [facility] Adult PT Treatment/Exercise - 08/26/18 0001      Shoulder Exercises:  Seated   Other Seated Exercises  bent over rows 4lb, ext 3lb , rev flys 1lb  2x15      Shoulder Exercises: Standing   External Rotation  20 reps;Theraband;Both    Theraband Level (Shoulder External Rotation)  Level 2 (Red)    Internal Rotation  Theraband;20 reps;Left    Theraband Level (Shoulder Internal Rotation)  Level 2 (Red)    Flexion  AROM;20 reps   cane    Other Standing Exercises  cabinet reaches flex 3 level, abd 2 level 2x5 each    Other Standing Exercises  Stretch from pull up bar 3x10 sec       Shoulder Exercises: ROM/Strengthening   UBE (Upper Arm Bike)  constant work 15 watts 3 min each way     Other ROM/Strengthening Exercises  25lb Rows 2x10, Chest press 5lb 2x10       Modalities   Modalities  Vasopneumatic      Vasopneumatic   Number Minutes Vasopneumatic   15 minutes    Vasopnuematic Location   Shoulder    Vasopneumatic Pressure  Medium    Vasopneumatic Temperature   32  Manual Therapy   Manual Therapy  Passive ROM    Joint Mobilization  grade 2-3 all directions    Passive ROM  LUE all directions within protocol                PT Short Term Goals - 06/21/18 1110      PT SHORT TERM GOAL #1   Title  independent with initial HEP    Status  Achieved        PT Long Term Goals - 08/24/18 1342      PT LONG TERM GOAL #1   Title  understand and do RICE at home    Status  Achieved      PT LONG TERM GOAL #2   Title  dress without difficulty    Status  Achieved      PT LONG TERM GOAL #3   Title  increase AROM to 120 degrees flexion    Status  On-going      PT LONG TERM GOAL #4   Title  reach into cabinet with 3#    Status  On-going      PT LONG TERM GOAL #5   Title  increase AROM of ER to 60 degrees    Status  On-going            Plan - 08/26/18 1344    Clinical Impression Statement  L shoulder ROM is limited, but he stated that his motion is better than it was before surgery. Good effort with all interventions but reports pain  at his end range of his motion when doing exercises. Progressed to chest press no pain only weakness and fatigue. He does report that's he can tell an improvement.    Rehab Potential  Good    PT Frequency  2x / week    PT Treatment/Interventions  Joint Manipulations    PT Next Visit Plan  follow protocol, treat pain as needed,       Patient will benefit from skilled therapeutic intervention in order to improve the following deficits and impairments:  Decreased range of motion, Impaired UE functional use, Increased muscle spasms, Pain, Improper body mechanics, Impaired flexibility, Decreased strength, Increased edema, Postural dysfunction  Visit Diagnosis: Localized edema  Acute pain of left shoulder  Stiffness of left shoulder, not elsewhere classified     Problem List Patient Active Problem List   Diagnosis Date Noted  . 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

## 2018-08-31 ENCOUNTER — Encounter: Payer: Self-pay | Admitting: Physical Therapy

## 2018-08-31 ENCOUNTER — Ambulatory Visit: Payer: Medicare Other | Admitting: Physical Therapy

## 2018-08-31 DIAGNOSIS — M25512 Pain in left shoulder: Secondary | ICD-10-CM

## 2018-08-31 DIAGNOSIS — R600 Localized edema: Secondary | ICD-10-CM | POA: Diagnosis not present

## 2018-08-31 DIAGNOSIS — M25612 Stiffness of left shoulder, not elsewhere classified: Secondary | ICD-10-CM

## 2018-08-31 NOTE — Therapy (Addendum)
[place], [place], [address] Phone: [phone]   Fax:  [phone] Progress Note Reporting Period 07/26/18 to 08/31/18 for visits 11-20  See note below for Objective Data and Assessment of Progress/Goals.      Physical Therapy Treatment  Patient Details  Name: Nicholas Terry MRN: [id number] Date of Birth: [birth year] Referring Provider (PT): Supple   Encounter Date: 08/31/2018  PT End of Session - 08/31/18 1342    Visit Number  20    Date for PT Re-Evaluation  09/18/18    PT Start Time  1300    PT Stop Time  1354    PT Time Calculation (min)  54 min    Activity Tolerance  Patient tolerated treatment well    Behavior During Therapy  [facility] for tasks assessed/performed       Past Medical History:  Diagnosis Date  . Arthritis   . Complication of anesthesia   . DVT (deep venous thrombosis) (HCC)    bil calf  . Foot mass, left    left plantar forefoot  . History of kidney stones   . PONV (postoperative nausea and vomiting)   . Seasonal allergies     Past Surgical History:  Procedure Laterality Date  . JOINT REPLACEMENT Left    knee  . KIDNEY STONE SURGERY     x3  . KNEE ARTHROSCOPY Bilateral   . MASS EXCISION Left 03/11/2018   Procedure: Excisional biopsy of left forefoot mass;  Surgeon: Wylene Simmer, MD;  Location: [place];  Service: Orthopedics;  Laterality: Left;  60 mins  . SHOULDER ARTHROSCOPY Right   . TOTAL SHOULDER ARTHROPLASTY Left 05/27/2018  . TOTAL SHOULDER ARTHROPLASTY Left 05/27/2018   Procedure: LEFT TOTAL SHOULDER ARTHROPLASTY;  Surgeon: Justice Britain, MD;  Location: [place];  Service: Orthopedics;  Laterality: Left;  150min    There were no vitals filed for this visit.  Subjective Assessment - 08/31/18 1305    Subjective  "Pretty good rainy day I am a little sore"    Currently in Pain?  Yes    Pain Score  1     Pain Location  Shoulder    Pain  Orientation  Right         [facility] PT Assessment - 08/31/18 0001      AROM   Left Shoulder Flexion  81 Degrees    Left Shoulder ABduction  59 Degrees    Left Shoulder Internal Rotation  75 Degrees    Left Shoulder External Rotation  35 Degrees                   [facility] Adult PT Treatment/Exercise - 08/31/18 0001      Shoulder Exercises: Supine   Flexion  Left;Limitations;20 reps;Strengthening    Shoulder Flexion Weight (lbs)  3      Shoulder Exercises: Seated   Other Seated Exercises  bent over rows 6lb, ext 4lb , rev flys 1lb  2x15      Shoulder Exercises: Standing   Other Standing Exercises  cabinet reaches flex 3 level, abd 2 level x10 each      Shoulder Exercises: ROM/Strengthening   UBE (Upper Arm Bike)  constant work 20 watts 3 min each way     Other ROM/Strengthening Exercises  25lb Rows 3x10, Chest press 5lb 2x10       Modalities   Modalities  Vasopneumatic      Vasopneumatic  Number Minutes Vasopneumatic   15 minutes    Vasopnuematic Location   Shoulder    Vasopneumatic Pressure  Medium    Vasopneumatic Temperature   32      Manual Therapy   Manual Therapy  Passive ROM    Manual therapy comments  Shoulder girg;le muscles are very tight    Joint Mobilization  grade 2-3 all directions    Passive ROM  LUE all directions within protocol                PT Short Term Goals - 06/21/18 1110      PT SHORT TERM GOAL #1   Title  independent with initial HEP    Status  Achieved        PT Long Term Goals - 08/24/18 1342      PT LONG TERM GOAL #1   Title  understand and do RICE at home    Status  Achieved      PT LONG TERM GOAL #2   Title  dress without difficulty    Status  Achieved      PT LONG TERM GOAL #3   Title  increase AROM to 120 degrees flexion    Status  On-going      PT LONG TERM GOAL #4   Title  reach into cabinet with 3#    Status  On-going      PT LONG TERM GOAL #5   Title  increase AROM of ER to 60 degrees     Status  On-going            Plan - 08/31/18 1343    Clinical Impression Statement  Mr. Denzell doe well with all of his exercises within his available ROM. He does reports L shoulder pain once he reached the end range. Increase resistance tolerated. LUE pain and fatigue with supine L shoulder flexion. Pain at end range of MT. Most PROM taken to end range and held. Passively pt ROM is improving.    Rehab Potential  Good    PT Frequency  2x / week    PT Duration  2 weeks    PT Next Visit Plan  follow protocol, treat pain as needed,       Patient will benefit from skilled therapeutic intervention in order to improve the following deficits and impairments:  Decreased range of motion, Impaired UE functional use, Increased muscle spasms, Pain, Improper body mechanics, Impaired flexibility, Decreased strength, Increased edema, Postural dysfunction  Visit Diagnosis: Localized edema  Acute pain of left shoulder  Stiffness of left shoulder, not elsewhere classified     Problem List Patient Active Problem List   Diagnosis Date Noted  . 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

## 2018-09-02 ENCOUNTER — Ambulatory Visit: Payer: Medicare Other | Admitting: Physical Therapy

## 2018-09-02 ENCOUNTER — Encounter: Payer: Self-pay | Admitting: Physical Therapy

## 2018-09-02 DIAGNOSIS — M25612 Stiffness of left shoulder, not elsewhere classified: Secondary | ICD-10-CM

## 2018-09-02 DIAGNOSIS — R600 Localized edema: Secondary | ICD-10-CM

## 2018-09-02 DIAGNOSIS — M25512 Pain in left shoulder: Secondary | ICD-10-CM

## 2018-09-02 NOTE — Therapy (Signed)
[place], [place], [address] Phone: [phone]   Fax:  [phone]  Physical Therapy Treatment  Patient Details  Name: Nicholas Terry MRN: [id number] Date of Birth: 1951-01-29 Referring Provider (PT): Supple   Encounter Date: 09/02/2018  PT End of Session - 09/02/18 1340    Visit Number  21    Date for PT Re-Evaluation  09/18/18    PT Start Time  1300    PT Stop Time  1355    PT Time Calculation (min)  55 min    Activity Tolerance  Patient tolerated treatment well    Behavior During Therapy  [facility] for tasks assessed/performed       Past Medical History:  Diagnosis Date  . Arthritis   . Complication of anesthesia   . DVT (deep venous thrombosis) (HCC)    bil calf  . Foot mass, left    left plantar forefoot  . History of kidney stones   . PONV (postoperative nausea and vomiting)   . Seasonal allergies     Past Surgical History:  Procedure Laterality Date  . JOINT REPLACEMENT Left    knee  . KIDNEY STONE SURGERY     x3  . KNEE ARTHROSCOPY Bilateral   . MASS EXCISION Left 03/11/2018   Procedure: Excisional biopsy of left forefoot mass;  Surgeon: Wylene Simmer, MD;  Location: [facility];  Service: Orthopedics;  Laterality: Left;  60 mins  . SHOULDER ARTHROSCOPY Right   . TOTAL SHOULDER ARTHROPLASTY Left 05/27/2018  . TOTAL SHOULDER ARTHROPLASTY Left 05/27/2018   Procedure: LEFT TOTAL SHOULDER ARTHROPLASTY;  Surgeon: Justice Britain, MD;  Location: [facility];  Service: Orthopedics;  Laterality: Left;  143min    There were no vitals filed for this visit.  Subjective Assessment - 09/02/18 1301    Subjective  "Everything is good except for my R shoulder"     Currently in Pain?  Yes    Pain Score  3     Pain Location  Shoulder    Pain Orientation  Right                       [facility] Adult PT Treatment/Exercise - 09/02/18 0001      Shoulder Exercises: Seated   Other Seated Exercises  bent over rows 6lb, ext 4lb , rev flys 2lb  2x15      Shoulder Exercises: Standing   Extension  Strengthening;Both;20 reps;Weights    Theraband Level (Shoulder Extension)  Level 4 (Blue)    Medical sales representative;Theraband;15 reps    Theraband Level (Shoulder Row)  Level 4 (Blue)    Other Standing Exercises  cabinet reaches flex 3 level, abd 2 level x10 each    Other Standing Exercises  biceps curls 20lb 2x15      Shoulder Exercises: ROM/Strengthening   UBE (Upper Arm Bike)  constant work 25 watts 3 min each way     Other ROM/Strengthening Exercises  35lb Rows 3x10, Chest press 10lb 2x10       Shoulder Exercises: Stretch   Other Shoulder Stretches  towel stretch x15 some assist from therapist      Modalities   Modalities  Vasopneumatic      Vasopneumatic   Number Minutes Vasopneumatic   15 minutes    Vasopnuematic Location   Shoulder    Vasopneumatic Pressure  Medium    Vasopneumatic Temperature   32  PT Short Term Goals - 06/21/18 1110      PT SHORT TERM GOAL #1   Title  independent with initial HEP    Status  Achieved        PT Long Term Goals - 08/24/18 1342      PT LONG TERM GOAL #1   Title  understand and do RICE at home    Status  Achieved      PT LONG TERM GOAL #2   Title  dress without difficulty    Status  Achieved      PT LONG TERM GOAL #3   Title  increase AROM to 120 degrees flexion    Status  On-going      PT LONG TERM GOAL #4   Title  reach into cabinet with 3#    Status  On-going      PT LONG TERM GOAL #5   Title  increase AROM of ER to 60 degrees    Status  On-going            Plan - 09/02/18 1340    Clinical Impression Statement  Pt ~ to have more difficulty with L shoulder AROM.  He had a more difficult time with three level cabinet reaches. Good strength with all bent over exercises, some ROM restrictions with reverse fly's. Cues to keep shoulder down with shoulder extensions.    PT  Treatment/Interventions  Cryotherapy;Electrical Stimulation;Functional mobility training;Ultrasound;Therapeutic activities;Therapeutic exercise;Neuromuscular re-education;Manual techniques;Scar mobilization;Passive range of motion;Joint Manipulations    PT Next Visit Plan  follow protocol, treat pain as needed,       Patient will benefit from skilled therapeutic intervention in order to improve the following deficits and impairments:  Decreased range of motion, Impaired UE functional use, Increased muscle spasms, Pain, Improper body mechanics, Impaired flexibility, Decreased strength, Increased edema, Postural dysfunction  Visit Diagnosis: Localized edema  Stiffness of left shoulder, not elsewhere classified  Acute pain of left shoulder     Problem List Patient Active Problem List   Diagnosis Date Noted  . 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

## 2018-09-07 ENCOUNTER — Encounter: Payer: Self-pay | Admitting: Physical Therapy

## 2018-09-07 ENCOUNTER — Ambulatory Visit: Payer: Medicare Other | Admitting: Physical Therapy

## 2018-09-07 DIAGNOSIS — M25512 Pain in left shoulder: Secondary | ICD-10-CM

## 2018-09-07 DIAGNOSIS — M25612 Stiffness of left shoulder, not elsewhere classified: Secondary | ICD-10-CM

## 2018-09-07 DIAGNOSIS — R600 Localized edema: Secondary | ICD-10-CM

## 2018-09-07 NOTE — Therapy (Signed)
[facility], [place], [address] Phone: [phone]   Fax:  [phone]  Physical Therapy Treatment  Patient Details  Name: Nicholas Terry MRN: [id number] Date of Birth: 1950/07/30 Referring Provider (PT): Supple   Encounter Date: 09/07/2018  PT End of Session - 09/07/18 1341    Visit Number  22    Date for PT Re-Evaluation  09/18/18    PT Start Time  1300    PT Stop Time  1355    PT Time Calculation (min)  55 min    Activity Tolerance  Patient tolerated treatment well    Behavior During Therapy  [facility] for tasks assessed/performed       Past Medical History:  Diagnosis Date  . Arthritis   . Complication of anesthesia   . DVT (deep venous thrombosis) (HCC)    bil calf  . Foot mass, left    left plantar forefoot  . History of kidney stones   . PONV (postoperative nausea and vomiting)   . Seasonal allergies     Past Surgical History:  Procedure Laterality Date  . JOINT REPLACEMENT Left    knee  . KIDNEY STONE SURGERY     x3  . KNEE ARTHROSCOPY Bilateral   . MASS EXCISION Left 03/11/2018   Procedure: Excisional biopsy of left forefoot mass;  Surgeon: Wylene Simmer, MD;  Location: [facility];  Service: Orthopedics;  Laterality: Left;  60 mins  . SHOULDER ARTHROSCOPY Right   . TOTAL SHOULDER ARTHROPLASTY Left 05/27/2018  . TOTAL SHOULDER ARTHROPLASTY Left 05/27/2018   Procedure: LEFT TOTAL SHOULDER ARTHROPLASTY;  Surgeon: Justice Britain, MD;  Location: [facility];  Service: Orthopedics;  Laterality: Left;  177min    There were no vitals filed for this visit.  Subjective Assessment - 09/07/18 1306    Subjective  "I am about a 2 or three in the R arm, ok in the left"    Currently in Pain?  Yes    Pain Score  3     Pain Location  Shoulder    Pain Orientation  Right                       [facility] Adult PT Treatment/Exercise - 09/07/18 0001      Shoulder Exercises:  Standing   External Rotation  Theraband;Both;15 reps    Theraband Level (Shoulder External Rotation)  Level 2 (Red)    Internal Rotation  Theraband;Left;15 reps   x2   Theraband Level (Shoulder Internal Rotation)  Level 2 (Red)    Other Standing Exercises  2 level cabinet reaches 2lb LUE x 10 Flex and abd       Shoulder Exercises: ROM/Strengthening   UBE (Upper Arm Bike)  constant work 25 watts 3 min each way     Other ROM/Strengthening Exercises  35lb Rows 3x10, Chest press 10lb 2x10       Modalities   Modalities  Vasopneumatic      Vasopneumatic   Number Minutes Vasopneumatic   15 minutes    Vasopnuematic Location   Shoulder    Vasopneumatic Pressure  Medium    Vasopneumatic Temperature   32      Manual Therapy   Manual Therapy  Passive ROM    Manual therapy comments  Shoulder girg;le muscles are very tight    Joint Mobilization  grade 2-3 all directions    Passive ROM  LUE all directions within  protocol                PT Short Term Goals - 06/21/18 1110      PT SHORT TERM GOAL #1   Title  independent with initial HEP    Status  Achieved        PT Long Term Goals - 08/24/18 1342      PT LONG TERM GOAL #1   Title  understand and do RICE at home    Status  Achieved      PT LONG TERM GOAL #2   Title  dress without difficulty    Status  Achieved      PT LONG TERM GOAL #3   Title  increase AROM to 120 degrees flexion    Status  On-going      PT LONG TERM GOAL #4   Title  reach into cabinet with 3#    Status  On-going      PT LONG TERM GOAL #5   Title  increase AROM of ER to 60 degrees    Status  On-going            Plan - 09/07/18 1342    Clinical Impression Statement  L shoulder ROM is still lacking overall. Despite limitation pt does give good effort with all activities. Progressed to resisted cabinet reaches with little compensation. Cues to keep elbow by side with external and internal rotations. Pain and tightness reported with stretches.      PT Frequency  2x / week    PT Duration  2 weeks    PT Treatment/Interventions  Cryotherapy;Electrical Stimulation;Functional mobility training;Ultrasound;Therapeutic activities;Therapeutic exercise;Neuromuscular re-education;Manual techniques;Scar mobilization;Passive range of motion;Joint Manipulations    PT Next Visit Plan  follow protocol, treat pain as needed,       Patient will benefit from skilled therapeutic intervention in order to improve the following deficits and impairments:  Decreased range of motion, Impaired UE functional use, Increased muscle spasms, Pain, Improper body mechanics, Impaired flexibility, Decreased strength, Increased edema, Postural dysfunction  Visit Diagnosis: Localized edema  Stiffness of left shoulder, not elsewhere classified  Acute pain of left shoulder     Problem List Patient Active Problem List   Diagnosis Date Noted  . 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

## 2018-09-09 ENCOUNTER — Encounter: Payer: Self-pay | Admitting: Physical Therapy

## 2018-09-09 ENCOUNTER — Ambulatory Visit: Payer: Medicare Other | Admitting: Physical Therapy

## 2018-09-09 DIAGNOSIS — R600 Localized edema: Secondary | ICD-10-CM | POA: Diagnosis not present

## 2018-09-09 DIAGNOSIS — M25512 Pain in left shoulder: Secondary | ICD-10-CM

## 2018-09-09 DIAGNOSIS — M25612 Stiffness of left shoulder, not elsewhere classified: Secondary | ICD-10-CM

## 2018-09-09 NOTE — Therapy (Signed)
[place], [place], [address] Phone: [phone]   Fax:  [phone]  Physical Therapy Treatment  Patient Details  Name: Nicholas Terry MRN: [id number] Date of Birth: July 07, 1951 Referring Provider (PT): Supple   Encounter Date: 09/09/2018  PT End of Session - 09/09/18 1341    Visit Number  23    Date for PT Re-Evaluation  09/18/18    PT Start Time  1300    PT Stop Time  1356    PT Time Calculation (min)  56 min    Activity Tolerance  Patient tolerated treatment well    Behavior During Therapy  [facility] for tasks assessed/performed       Past Medical History:  Diagnosis Date  . Arthritis   . Complication of anesthesia   . DVT (deep venous thrombosis) (HCC)    bil calf  . Foot mass, left    left plantar forefoot  . History of kidney stones   . PONV (postoperative nausea and vomiting)   . Seasonal allergies     Past Surgical History:  Procedure Laterality Date  . JOINT REPLACEMENT Left    knee  . KIDNEY STONE SURGERY     x3  . KNEE ARTHROSCOPY Bilateral   . MASS EXCISION Left 03/11/2018   Procedure: Excisional biopsy of left forefoot mass;  Surgeon: Wylene Simmer, MD;  Location: [facility];  Service: Orthopedics;  Laterality: Left;  60 mins  . SHOULDER ARTHROSCOPY Right   . TOTAL SHOULDER ARTHROPLASTY Left 05/27/2018  . TOTAL SHOULDER ARTHROPLASTY Left 05/27/2018   Procedure: LEFT TOTAL SHOULDER ARTHROPLASTY;  Surgeon: Justice Britain, MD;  Location: [facility];  Service: Orthopedics;  Laterality: Left;  110min    There were no vitals filed for this visit.  Subjective Assessment - 09/09/18 1303    Subjective  "Im all right man"    Currently in Pain?  Yes    Pain Score  3     Pain Location  Shoulder    Pain Orientation  Right         [facility] PT Assessment - 09/09/18 0001      AROM   Left Shoulder Flexion  75 Degrees    Left Shoulder ABduction  56 Degrees                    [facility] Adult PT Treatment/Exercise - 09/09/18 0001      Shoulder Exercises: Standing   Horizontal ABduction  10 reps;PROM    External Rotation  Theraband;Both;20 reps    Theraband Level (Shoulder External Rotation)  Level 2 (Red)    Internal Rotation  Theraband;Left;20 reps    Theraband Level (Shoulder Internal Rotation)  Level 2 (Red)    Extension  Strengthening;Both;20 reps;Weights    Theraband Level (Shoulder Extension)  Level 3 (Green)    Other Standing Exercises  2 level cabinet reaches 2lb LUE 2x 10 Flex and abd     Other Standing Exercises  biceps curls 20lb 2x15, Triceps ext 35lb 2x15       Shoulder Exercises: ROM/Strengthening   UBE (Upper Arm Bike)  constant work 20 watts 3 min each way     Other ROM/Strengthening Exercises  35lb Rows 3x10, Chest press 10lb 2x15      Modalities   Modalities  Vasopneumatic      Vasopneumatic   Number Minutes Vasopneumatic   15 minutes    Vasopnuematic Location   Shoulder  Vasopneumatic Pressure  Medium    Vasopneumatic Temperature   32               PT Short Term Goals - 06/21/18 1110      PT SHORT TERM GOAL #1   Title  independent with initial HEP    Status  Achieved        PT Long Term Goals - 08/24/18 1342      PT LONG TERM GOAL #1   Title  understand and do RICE at home    Status  Achieved      PT LONG TERM GOAL #2   Title  dress without difficulty    Status  Achieved      PT LONG TERM GOAL #3   Title  increase AROM to 120 degrees flexion    Status  On-going      PT LONG TERM GOAL #4   Title  reach into cabinet with 3#    Status  On-going      PT LONG TERM GOAL #5   Title  increase AROM of ER to 60 degrees    Status  On-going            Plan - 09/09/18 1344    Clinical Impression Statement  Good effort with all interventions using his available ROM. No increase in L shoulder ROM bu he is getting stronger in his available ROM. L shoulder pain noted with resisted  shoulder abduction, had to discontnued the resistance.     PT Treatment/Interventions  Cryotherapy;Electrical Stimulation;Functional mobility training;Ultrasound;Therapeutic activities;Therapeutic exercise;Neuromuscular re-education;Manual techniques;Scar mobilization;Passive range of motion;Joint Manipulations    PT Next Visit Plan  follow protocol, treat pain as needed, D/C next week       Patient will benefit from skilled therapeutic intervention in order to improve the following deficits and impairments:  Decreased range of motion, Impaired UE functional use, Increased muscle spasms, Pain, Improper body mechanics, Impaired flexibility, Decreased strength, Increased edema, Postural dysfunction  Visit Diagnosis: Localized edema  Stiffness of left shoulder, not elsewhere classified  Acute pain of left shoulder     Problem List Patient Active Problem List   Diagnosis Date Noted  . 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

## 2018-09-14 ENCOUNTER — Ambulatory Visit: Payer: Medicare Other | Admitting: Physical Therapy

## 2018-09-14 ENCOUNTER — Encounter: Payer: Self-pay | Admitting: Physical Therapy

## 2018-09-14 DIAGNOSIS — R600 Localized edema: Secondary | ICD-10-CM

## 2018-09-14 DIAGNOSIS — M25512 Pain in left shoulder: Secondary | ICD-10-CM

## 2018-09-14 DIAGNOSIS — M25612 Stiffness of left shoulder, not elsewhere classified: Secondary | ICD-10-CM

## 2018-09-14 NOTE — Therapy (Signed)
[place], [place], [address] Phone: [phone]   Fax:  [phone]  Physical Therapy Treatment  Patient Details  Name: Nicholas Terry MRN: [id number] Date of Birth: 1950-11-09 Referring Provider (PT): Supple   Encounter Date: 09/14/2018  PT End of Session - 09/14/18 1345    Visit Number  24    Date for PT Re-Evaluation  09/18/18    PT Start Time  1300    PT Stop Time  1400    PT Time Calculation (min)  60 min    Activity Tolerance  Patient tolerated treatment well    Behavior During Therapy  [facility] for tasks assessed/performed       Past Medical History:  Diagnosis Date  . Arthritis   . Complication of anesthesia   . DVT (deep venous thrombosis) (HCC)    bil calf  . Foot mass, left    left plantar forefoot  . History of kidney stones   . PONV (postoperative nausea and vomiting)   . Seasonal allergies     Past Surgical History:  Procedure Laterality Date  . JOINT REPLACEMENT Left    knee  . KIDNEY STONE SURGERY     x3  . KNEE ARTHROSCOPY Bilateral   . MASS EXCISION Left 03/11/2018   Procedure: Excisional biopsy of left forefoot mass;  Surgeon: Wylene Simmer, MD;  Location: [facility];  Service: Orthopedics;  Laterality: Left;  60 mins  . SHOULDER ARTHROSCOPY Right   . TOTAL SHOULDER ARTHROPLASTY Left 05/27/2018  . TOTAL SHOULDER ARTHROPLASTY Left 05/27/2018   Procedure: LEFT TOTAL SHOULDER ARTHROPLASTY;  Surgeon: Justice Britain, MD;  Location: [facility];  Service: Orthopedics;  Laterality: Left;  173min    There were no vitals filed for this visit.  Subjective Assessment - 09/14/18 1303    Subjective  "Good" Pt reports that he has just left the "Y" "Walking and leg exercises"    Currently in Pain?  Yes    Pain Score  1     Pain Location  Shoulder    Pain Orientation  Right                       [facility] Adult PT Treatment/Exercise - 09/14/18 0001      Shoulder Exercises: Seated   Other Seated Exercises  bent over rows 7lb, ext 4lb , rev flys 2lb  2x15      Shoulder Exercises: Standing   Flexion  Strengthening;Both;20 reps    Shoulder Flexion Weight (lbs)  1    ABduction  Both;Weights;20 reps    Shoulder ABduction Weight (lbs)  1    Other Standing Exercises  2 level cabinet reaches 2lb LUE 2x 10 Flex and abd       Shoulder Exercises: ROM/Strengthening   UBE (Upper Arm Bike)  constant work 20 watts 3 min each way     Other ROM/Strengthening Exercises  45lb Rows 2x15, Chest press 10lb 2x15      Modalities   Modalities  Vasopneumatic      Vasopneumatic   Number Minutes Vasopneumatic   15 minutes    Vasopnuematic Location   Shoulder    Vasopneumatic Pressure  Medium    Vasopneumatic Temperature   32      Manual Therapy   Manual Therapy  Passive ROM    Manual therapy comments  Shoulder girg;le muscles are very tight    Joint Mobilization  grade  2-3 all directions    Soft tissue mobilization  L deltoid into subcap    Passive ROM  LUE all directions within protocol                PT Short Term Goals - 06/21/18 1110      PT SHORT TERM GOAL #1   Title  independent with initial HEP    Status  Achieved        PT Long Term Goals - 08/24/18 1342      PT LONG TERM GOAL #1   Title  understand and do RICE at home    Status  Achieved      PT LONG TERM GOAL #2   Title  dress without difficulty    Status  Achieved      PT LONG TERM GOAL #3   Title  increase AROM to 120 degrees flexion    Status  On-going      PT LONG TERM GOAL #4   Title  reach into cabinet with 3#    Status  On-going      PT LONG TERM GOAL #5   Title  increase AROM of ER to 60 degrees    Status  On-going            Plan - 09/14/18 1345    Clinical Impression Statement  Pt reports a functional improvement with his L arm ably to reach for things at drive through windows. Progressed with some interventions adding additional resistance to  seat ed and bent over rows. Pt does well with is available ROM which is still very limited. Tight dense muscle tissue around shoulder and pain appear to be limiting L shoulder ROM.     Rehab Potential  Good    PT Frequency  2x / week    PT Duration  2 weeks    PT Treatment/Interventions  Cryotherapy;Electrical Stimulation;Functional mobility training;Ultrasound;Therapeutic activities;Therapeutic exercise;Neuromuscular re-education;Manual techniques;Scar mobilization;Passive range of motion;Joint Manipulations    PT Next Visit Plan  D/C nect week       Patient will benefit from skilled therapeutic intervention in order to improve the following deficits and impairments:  Decreased range of motion, Impaired UE functional use, Increased muscle spasms, Pain, Improper body mechanics, Impaired flexibility, Decreased strength, Increased edema, Postural dysfunction  Visit Diagnosis: Localized edema  Stiffness of left shoulder, not elsewhere classified  Acute pain of left shoulder     Problem List Patient Active Problem List   Diagnosis Date Noted  . 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

## 2018-09-16 ENCOUNTER — Encounter: Payer: Self-pay | Admitting: Physical Therapy

## 2018-09-16 ENCOUNTER — Ambulatory Visit: Payer: Medicare Other | Admitting: Physical Therapy

## 2018-09-16 DIAGNOSIS — M25512 Pain in left shoulder: Secondary | ICD-10-CM

## 2018-09-16 DIAGNOSIS — M25612 Stiffness of left shoulder, not elsewhere classified: Secondary | ICD-10-CM

## 2018-09-16 DIAGNOSIS — R600 Localized edema: Secondary | ICD-10-CM

## 2018-09-16 NOTE — Therapy (Signed)
[address], [place], [address] Phone: [phone]   Fax:  [phone]  Physical Therapy Treatment  Patient Details  Name: Nicholas Terry MRN: [id number] Date of Birth: [birth year] Referring Provider (PT): Supple   Encounter Date: 09/16/2018  PT End of Session - 09/16/18 1342    Visit Number  25    Date for PT Re-Evaluation  09/18/18    PT Start Time  1300    PT Stop Time  1354    PT Time Calculation (min)  54 min    Activity Tolerance  Patient tolerated treatment well    Behavior During Therapy  [facility] for tasks assessed/performed       Past Medical History:  Diagnosis Date  . Arthritis   . Complication of anesthesia   . DVT (deep venous thrombosis) (HCC)    bil calf  . Foot mass, left    left plantar forefoot  . History of kidney stones   . PONV (postoperative nausea and vomiting)   . Seasonal allergies     Past Surgical History:  Procedure Laterality Date  . JOINT REPLACEMENT Left    knee  . KIDNEY STONE SURGERY     x3  . KNEE ARTHROSCOPY Bilateral   . MASS EXCISION Left 03/11/2018   Procedure: Excisional biopsy of left forefoot mass;  Surgeon: Wylene Simmer, MD;  Location: [place];  Service: Orthopedics;  Laterality: Left;  60 mins  . SHOULDER ARTHROSCOPY Right   . TOTAL SHOULDER ARTHROPLASTY Left 05/27/2018  . TOTAL SHOULDER ARTHROPLASTY Left 05/27/2018   Procedure: LEFT TOTAL SHOULDER ARTHROPLASTY;  Surgeon: Justice Britain, MD;  Location: [place];  Service: Orthopedics;  Laterality: Left;  150mn    There were no vitals filed for this visit.  Subjective Assessment - 09/16/18 1300    Subjective  "L side great, R side 2 to 3" "Went to the Y and loosened it up a bit"    Currently in Pain?  Yes    Pain Score  3     Pain Location  Shoulder    Pain Orientation  Right         [facility] PT Assessment - 09/16/18 0001      AROM   Left Shoulder Flexion  82 Degrees    Left Shoulder ABduction  65 Degrees                   [facility] Adult PT Treatment/Exercise - 09/16/18 0001      Shoulder Exercises: Seated   Other Seated Exercises  bent over rows 7lb, ext 4lb , rev flys 2lb  2x15      Shoulder Exercises: Standing   Horizontal ABduction  AROM;Both;20 reps    External Rotation  Theraband;Both;20 reps    Theraband Level (Shoulder External Rotation)  Level 2 (Red)    Internal Rotation  Theraband;Left;20 reps    Theraband Level (Shoulder Internal Rotation)  Level 2 (Red)    Flexion  Strengthening;Both;20 reps    Shoulder Flexion Weight (lbs)  1    ABduction  Both;Weights;20 reps    Shoulder ABduction Weight (lbs)  1    Other Standing Exercises  biceps curls 20lb 2x15, Triceps ext 35lb 2x15       Shoulder Exercises: ROM/Strengthening   UBE (Upper Arm Bike)  constant work 20 watts 3 min each way     Other ROM/Strengthening Exercises  45lb Rows 2x15, Chest press 10lb 2x15  Modalities   Modalities  Vasopneumatic      Vasopneumatic   Number Minutes Vasopneumatic   15 minutes    Vasopnuematic Location   Shoulder    Vasopneumatic Pressure  Medium    Vasopneumatic Temperature   32               PT Short Term Goals - 06/21/18 1110      PT SHORT TERM GOAL #1   Title  independent with initial HEP    Status  Achieved        PT Long Term Goals - 09/16/18 1348      PT LONG TERM GOAL #3   Title  increase AROM to 120 degrees flexion    Status  On-going      PT LONG TERM GOAL #4   Title  reach into cabinet with 3#      PT LONG TERM GOAL #5   Status  Partially Met            Plan - 09/16/18 1343    Clinical Impression Statement  Pt reports improved function at home despite having limited L shoulder ROM. Pt with a mild increase in L shoulder flexion and abduction. Pt is pleased with his current functional status and would like to continue his exercises at the [facility]    Rehab Potential  Good    PT Frequency  2x / week     PT Duration  2 weeks    PT Treatment/Interventions  Cryotherapy;Electrical Stimulation;Functional mobility training;Ultrasound;Therapeutic activities;Therapeutic exercise;Neuromuscular re-education;Manual techniques;Scar mobilization;Passive range of motion;Joint Manipulations    PT Next Visit Plan  D/C PT       Patient will benefit from skilled therapeutic intervention in order to improve the following deficits and impairments:  Decreased range of motion, Impaired UE functional use, Increased muscle spasms, Pain, Improper body mechanics, Impaired flexibility, Decreased strength, Increased edema, Postural dysfunction  Visit Diagnosis: Localized edema  Stiffness of left shoulder, not elsewhere classified  Acute pain of left shoulder     Problem List Patient Active Problem List   Diagnosis Date Noted  . 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  Patient goals were partially met. Patient is being discharged due to being pleased with the current functional level.  ?????    [name], PTA 09/16/2018, 1:49 PM  [address] [place], [place], [address] Phone: [phone]   Fax:  [phone]  Name: Nicholas Terry MRN: [id number] Date of Birth: 1950/11/29

## 2019-08-18 ENCOUNTER — Telehealth: Payer: Self-pay | Admitting: Hematology

## 2019-08-18 NOTE — Telephone Encounter (Signed)
Received a new hem referral from Dr. Francesco Sor at [facility] for hx of dvt. Mr. Nicholas Terry has been cld and scheduled to see Dr. Irene Limbo on 2/16 at 10am. Pt aware to arrive 15 minutes early

## 2019-08-20 ENCOUNTER — Ambulatory Visit: Payer: Medicare Other

## 2019-08-27 ENCOUNTER — Ambulatory Visit: Payer: Medicare PPO

## 2019-08-31 ENCOUNTER — Ambulatory Visit: Payer: Medicare Other

## 2019-09-06 ENCOUNTER — Inpatient Hospital Stay: Payer: Medicare PPO | Attending: Hematology | Admitting: Hematology

## 2019-09-06 ENCOUNTER — Other Ambulatory Visit: Payer: Self-pay

## 2019-09-06 ENCOUNTER — Inpatient Hospital Stay: Payer: Medicare PPO

## 2019-09-06 VITALS — BP 113/74 | HR 92 | Temp 97.8°F | Resp 18 | Ht 69.0 in | Wt 146.6 lb

## 2019-09-06 DIAGNOSIS — Z86718 Personal history of other venous thrombosis and embolism: Secondary | ICD-10-CM

## 2019-09-06 DIAGNOSIS — Z7901 Long term (current) use of anticoagulants: Secondary | ICD-10-CM | POA: Insufficient documentation

## 2019-09-06 DIAGNOSIS — I82519 Chronic embolism and thrombosis of unspecified femoral vein: Secondary | ICD-10-CM

## 2019-09-06 NOTE — Progress Notes (Signed)
HEMATOLOGY/ONCOLOGY CONSULTATION NOTE  Date of Service: 09/06/2019  Patient Care Team: Sueanne Margarita, DO as PCP - General (Internal Medicine)  CHIEF COMPLAINTS/PURPOSE OF CONSULTATION:  Hx of DVT  HISTORY OF PRESENTING ILLNESS:   Nicholas Terry is a wonderful 69 y.o. male who has been referred to [place] by Dr Francesco Sor for evaluation and management of Hx of DVT. The pt reports that he is doing well overall.   The pt reports that in the Spring of 2010 he had significant swelling in his right leg and was found to have a blood clot. Pt was treated with Warfarin for 5 months and the swelling improved. It was thought that the clot had resolved, as he had no further issues with leg swelling.  There was no provoking event ever identified for this blood clot. Pt was taking a baby Asprin daily during the onset of his first clot. He saw a Hematologist at [facility] that did not think that the pt would have a repeat event. When the pt went in for a procedure to correct varicose veins they found a small repeat clot. He was then placed back on Warfarin. About 5 years ago he switched to 20 mg of Xarelto daily, and for the last 1.5 years he has been taking 15 mg of Xarelto daily. His primary care has been managing his anticogulation over the last 5-6 years.   Pt has since had b/l venous surgery and reports no side effects. He has had evaluation by a vascular surgeon and there was never a concern for May Thurner's syndrome. His father had a small PE when he was in his 87's but had no prior Hx of blood clots. There were no genetic studies done after either of the pt's clots. Pt has been as active as he can during the pandemic and his mobility has not been limited by any leg swelling or symptoms.  Of note prior to the patient's visit today, pt has had LE Venous Duplex completed on 03/06/2009 with results revealing Venous Duplex of the right lower extremity was examined in its entirety and is positive for chronic deep vein  thrombosis.  The common femoral, femoral and peroneal veins were visualized and were fully compressible with spontaneous, phasic venous flow. The popliteal and posterior tibial veins demonstrated chronic thrombus with partial compression and color filling, suggestive of non-occlusive venous thrombosis."   Pt has had LE Venous Duplex completed on 10/23/2010 with results revealing "Venous Duplex of the right lower extremity was examined in its entirety with evidence of chronic deep venous thrombosis."  Most recent lab results (01/04/2019) of CBC and BMP is as follows: all values are WNL.   On review of systems, pt reports bruising and denies nose bleeds, gum bleeds, bloody/black stools, other bleeding concerns, SOB, chest pain and any other symptoms.   On PMHx the pt reports Kidney Stones, Seasonal Allergies, Left Knee Replacement, B/L Knee Arthroscopy, Shoulder Arthroscopy  On Family Hx the pt reports his father had a small PE in his 16's   MEDICAL HISTORY:  Past Medical History:  Diagnosis Date  . Arthritis   . Complication of anesthesia   . DVT (deep venous thrombosis) (HCC)    bil calf  . Foot mass, left    left plantar forefoot  . History of kidney stones   . PONV (postoperative nausea and vomiting)   . Seasonal allergies     SURGICAL HISTORY: Past Surgical History:  Procedure Laterality Date  . JOINT REPLACEMENT Left  knee  . KIDNEY STONE SURGERY     x3  . KNEE ARTHROSCOPY Bilateral   . MASS EXCISION Left 03/11/2018   Procedure: Excisional biopsy of left forefoot mass;  Surgeon: Wylene Simmer, MD;  Location: [place];  Service: Orthopedics;  Laterality: Left;  60 mins  . SHOULDER ARTHROSCOPY Right   . TOTAL SHOULDER ARTHROPLASTY Left 05/27/2018  . TOTAL SHOULDER ARTHROPLASTY Left 05/27/2018   Procedure: LEFT TOTAL SHOULDER ARTHROPLASTY;  Surgeon: Justice Britain, MD;  Location: [place];  Service: Orthopedics;  Laterality: Left;  119min    SOCIAL HISTORY: Social  History   Socioeconomic History  . Marital status: Single    Spouse name: Not on file  . Number of children: Not on file  . Years of education: Not on file  . Highest education level: Not on file  Occupational History  . Not on file  Tobacco Use  . Smoking status: Never Smoker  . Smokeless tobacco: Never Used  Substance and Sexual Activity  . Alcohol use: Yes    Comment: social  . Drug use: Never  . Sexual activity: Not on file  Other Topics Concern  . Not on file  Social History Narrative  . Not on file   Social Determinants of Health   Financial Resource Strain:   . Difficulty of Paying Living Expenses: Not on file  Food Insecurity:   . Worried About Charity fundraiser in the Last Year: Not on file  . Ran Out of Food in the Last Year: Not on file  Transportation Needs:   . Lack of Transportation (Medical): Not on file  . Lack of Transportation (Non-Medical): Not on file  Physical Activity:   . Days of Exercise per Week: Not on file  . Minutes of Exercise per Session: Not on file  Stress:   . Feeling of Stress : Not on file  Social Connections:   . Frequency of Communication with Friends and Family: Not on file  . Frequency of Social Gatherings with Friends and Family: Not on file  . Attends Religious Services: Not on file  . Active Member of Clubs or Organizations: Not on file  . Attends Archivist Meetings: Not on file  . Marital Status: Not on file  Intimate Partner Violence:   . Fear of Current or Ex-Partner: Not on file  . Emotionally Abused: Not on file  . Physically Abused: Not on file  . Sexually Abused: Not on file    FAMILY HISTORY: No family history on file.  ALLERGIES:  has No Known Allergies.  MEDICATIONS:  Current Outpatient Medications  Medication Sig Dispense Refill  . acetaminophen (TYLENOL) 650 MG CR tablet Take 650 mg by mouth daily.    . APPLE CIDER VINEGAR PO Take 1 each by mouth 2 (two) times daily. Takes 2 Gummies per day     . Carboxymethylcellul-Glycerin (REFRESH OPTIVE OP) Place 1 drop into both eyes daily.    . cyclobenzaprine (FLEXERIL) 10 MG tablet Take 1 tablet (10 mg total) by mouth 3 (three) times daily as needed for muscle spasms. 30 tablet 1  . loratadine (CLARITIN) 10 MG tablet Take 10 mg by mouth daily.    . Multiple Vitamin (MULTIVITAMIN WITH MINERALS) TABS tablet Take 1 tablet by mouth daily.    Marland Kitchen oxyCODONE-acetaminophen (PERCOCET) 5-325 MG tablet Take 1 tablet by mouth every 4 (four) hours as needed (max 6 q). 10 tablet 0  . Rivaroxaban (XARELTO) 15 MG TABS tablet Take  15 mg by mouth every morning.     . Saw Palmetto 500 MG CAPS Take 500 mg by mouth daily.     . traMADol (ULTRAM) 50 MG tablet Take 1 tablet (50 mg total) by mouth daily as needed for moderate pain or severe pain. 30 tablet 0  . ondansetron (ZOFRAN) 4 MG tablet Take 1 tablet (4 mg total) by mouth every 8 (eight) hours as needed for nausea or vomiting. 10 tablet 0   No current facility-administered medications for this visit.    REVIEW OF SYSTEMS:    10 Point review of Systems was done is negative except as noted above.  PHYSICAL EXAMINATION: ECOG PERFORMANCE STATUS: 0 - Asymptomatic  . Vitals:   09/06/19 1033  BP: 113/74  Pulse: 92  Resp: 18  Temp: 97.8 F (36.6 C)  SpO2: 98%   Filed Weights   09/06/19 1033  Weight: 146 lb 9.6 oz (66.5 kg)   .Body mass index is 21.65 kg/m.  GENERAL:alert, in no acute distress and comfortable SKIN: no acute rashes, no significant lesions EYES: conjunctiva are pink and non-injected, sclera anicteric OROPHARYNX: MMM, no exudates, no oropharyngeal erythema or ulceration NECK: supple, no JVD LYMPH:  no palpable lymphadenopathy in the cervical, axillary or inguinal regions LUNGS: clear to auscultation b/l with normal respiratory effort HEART: regular rate & rhythm ABDOMEN:  normoactive bowel sounds , non tender, not distended. Extremity: no pedal edema PSYCH: alert & oriented x 3  with fluent speech NEURO: no focal motor/sensory deficits  LABORATORY DATA:  I have reviewed the data as listed  . CBC Latest Ref Rng & Units 05/19/2018  WBC 4.0 - 10.5 K/uL 5.4  Hemoglobin 13.0 - 17.0 g/dL 13.8  Hematocrit 39.0 - 52.0 % 42.3  Platelets 150 - 400 K/uL 205    . CMP Latest Ref Rng & Units 05/19/2018  Glucose 70 - 99 mg/dL 106(H)  BUN 8 - 23 mg/dL 16  Creatinine 0.61 - 1.24 mg/dL 0.89  Sodium 135 - 145 mmol/L 137  Potassium 3.5 - 5.1 mmol/L 4.0  Chloride 98 - 111 mmol/L 105  CO2 22 - 32 mmol/L 26  Calcium 8.9 - 10.3 mg/dL 9.2     RADIOGRAPHIC STUDIES: I have personally reviewed the radiological images as listed and agreed with the findings in the report. No results found.  ASSESSMENT & PLAN:   69 yo with  1) Remote h/o recurrent lower extremity DVT Risk factors varicose veins. PLAN: -Discussed patient's most recent labs from 01/04/2019, all values are WNL -Discussed 03/06/2009 LE Venous Duplex completed on 03/06/2009 which revealed "Venous Duplex of the right lower extremity was examined in its entirety and is positive for chronic deep vein thrombosis.  The common femoral, femoral and peroneal veins were visualized and were fully compressible with spontaneous, phasic venous flow. The popliteal and posterior tibial veins demonstrated chronic thrombus with partial compression and color filling, suggestive of non-occlusive venous thrombosis." -Discussed  10/23/2010 LE Venous Duplex completed on 10/23/2010 which revealed "Venous Duplex of the right lower extremity was examined in its entirety with evidence of chronic deep venous thrombosis." -Advised pt that varicose veins are a risk factor for blood clots -Advised pt that an unprovoked blood clots carry a significantly higher risk of a repeat event than provoked blood clots -Advised pt that an extensive blood clot with no provoking factor will require life-long blood thinners  -Due to local risk factors that were  addressed and no repeat event, would not be unreasonable to  place pt on a preventive dose of Xarelto  -Advised pt to stay active, well hydrated, wear compression socks, avoid long-distance travel, and move about every hour if sitting still for long periods  -Pt can begin taking 10 mg of Xarelto daily from the next prescription with his PCP -Recommend pt f/u with PCP for anticoagulation management -Will see back as needed   FOLLOW UP: RTC with PCP  All of the patients questions were answered with apparent satisfaction. The patient knows to call the clinic with any problems, questions or concerns.  I spent 35 mins counseling the patient face to face. The total time spent in the appointment was 45 minutes and more than 50% was on counseling and direct patient cares.    Sullivan Lone MD [place] AAHIVMS [facility] Hematology/Oncology Physician [facility]  (Office):       [phone] (Work cell):  [phone] (Fax):           [phone]  09/06/2019 1:03 PM  I, Yevette Edwards, am acting as a scribe for Dr. Sullivan Lone.   .I have reviewed the above documentation for accuracy and completeness, and I agree with the above. Brunetta Genera MD

## 2019-09-06 NOTE — Patient Instructions (Signed)
Thank you for choosing [facility] to provide your oncology and hematology care.   Should you have questions after your visit to the [facility] ([facility]), please contact this office at [phone] between 8:30 AM and 4:30 PM.  Voice mails left after 4:00 PM may not be returned until the following business day.  Calls received after 4:30 PM will be answered by an off-site Nurse Triage Line.    Prescription Refills:  Please have your pharmacy contact us directly for most prescription requests.  Contact the office directly for refills of narcotics (pain medications). Allow 48-72 hours for refills.  Appointments: Please contact the [facility] scheduling department [phone] for questions regarding [facility] appointment scheduling.  Contact the schedulers with any scheduling changes so that your appointment can be rescheduled in a timely manner.   Central Scheduling for [place] [phone] - Call to schedule procedures such as PET scans, CT scans, MRI, Ultrasound, etc.  To afford each patient quality time with our providers, please arrive 30 minutes before your scheduled appointment time.  If you arrive late for your appointment, you may be asked to reschedule.  We strive to give you quality time with our providers, and arriving late affects you and other patients whose appointments are after yours. If you are a no show for multiple scheduled visits, you may be dismissed from the clinic at the providers discretion.     Resources: [facility] Social Workers 336-832-0950 for additional information on assistance programs or assistance connecting with community support programs   [place] DSS  336-641-3447: Information regarding food stamps, Medicaid, and utility assistance [place] 336-333-6589   [place] [employer]'s shared-ride transportation service for eligible riders who have a disability that prevents them from riding the fixed route bus.   Medicare Rights Center  800-333-4114 Helps people with Medicare understand their rights and benefits, navigate the Medicare system, and secure the quality healthcare they deserve [employer] 800-227-2345 Assists patients locate various types of support and financial assistance Cancer Care: 1-800-813-HOPE (4673) Provides financial assistance, online support groups, medication/co-pay assistance.   Transportation Assistance for appointments at [facility]: Transportation Coordinator [phone]  Again, thank you for choosing [facility] for your care.       

## 2019-09-13 ENCOUNTER — Ambulatory Visit: Payer: Medicare PPO | Admitting: Internal Medicine

## 2019-09-13 VITALS — BP 110/60 | HR 83 | Temp 97.8°F | Ht 69.0 in | Wt 144.0 lb

## 2019-09-13 DIAGNOSIS — K625 Hemorrhage of anus and rectum: Secondary | ICD-10-CM | POA: Diagnosis not present

## 2019-09-13 DIAGNOSIS — Z7901 Long term (current) use of anticoagulants: Secondary | ICD-10-CM | POA: Diagnosis not present

## 2019-09-13 DIAGNOSIS — K648 Other hemorrhoids: Secondary | ICD-10-CM

## 2019-09-13 DIAGNOSIS — Z86010 Personal history of colonic polyps: Secondary | ICD-10-CM

## 2019-09-13 MED ORDER — HYDROCORTISONE (PERIANAL) 2.5 % EX CREA: 1.0000 "application " | TOPICAL_CREAM | Freq: Every day | CUTANEOUS | 1 refills | Status: DC

## 2019-09-13 NOTE — Progress Notes (Signed)
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  The patient, previously in [facility], underwent colonoscopy with Dr. Alease Frame April 17, 2015.  I have reviewed that report.  He was found to have diminutive colon polyps.  These were hyperplastic and adenomatous.  The ileum was normal.  Grade 2 prolapsing internal hemorrhoids were noted.  These reduced spontaneously.  Follow-up colonoscopy in 5 years recommended.  Patient has relocated to [place].  His DVT was 11 years ago.  He has come off anticoagulation for procedures.  He did not have pulmonary embolism.  He is seeing hematology regarding the prospects of coming off anticoagulation therapy.  His current complaint is persistent problems with prolapsing hemorrhoids.  Uncomfortable.  Difficulty cleaning.  Rare trivial blood on the tissue associated with the same.  GI review of systems otherwise negative.  REVIEW OF SYSTEMS:  All non-GI ROS negative unless otherwise stated in the HPI except for arthritis  Past Medical History:  Diagnosis Date  . Arthritis   . BPH (benign prostatic hyperplasia)   . Complication of anesthesia   . DVT (deep venous thrombosis) (HCC)    bil calf  . ED (erectile dysfunction)   . Foot mass, left    left plantar forefoot  . History of colon polyps   . History of kidney stones   . Internal hemorrhoids   . PONV (postoperative nausea and vomiting)   . Seasonal allergies     Past Surgical History:  Procedure Laterality Date  . JOINT REPLACEMENT Left    knee  . KIDNEY STONE SURGERY     x3  . KNEE ARTHROSCOPY Bilateral   . MASS EXCISION Left 03/11/2018   Procedure: Excisional biopsy of left forefoot mass;  Surgeon: Wylene Simmer, MD;  Location: [place];  Service: Orthopedics;  Laterality: Left;  60 mins  . SHOULDER ARTHROSCOPY Right    . TOTAL SHOULDER ARTHROPLASTY Left 05/27/2018  . TOTAL SHOULDER ARTHROPLASTY Left 05/27/2018   Procedure: LEFT TOTAL SHOULDER ARTHROPLASTY;  Surgeon: Justice Britain, MD;  Location: [place];  Service: Orthopedics;  Laterality: Left;  111min    Social History Taidyn Boesch  reports that he has never smoked. He has never used smokeless tobacco. He reports current alcohol use. He reports that he does not use drugs.  family history includes Parkinson's disease in his mother; Stroke in his father.  No Known Allergies     PHYSICAL EXAMINATION: Vital signs: BP 110/60   Pulse 83   Temp 97.8 F (36.6 C)   Ht 5\' 9"  (1.753 m)   Wt 144 lb (65.3 kg)   BMI 21.27 kg/m   Constitutional: generally well-appearing, no acute distress Psychiatric: alert and oriented x3, cooperative Eyes: extraocular movements intact, anicteric, conjunctiva pink Mouth: oral pharynx moist, no lesions Neck: supple no lymphadenopathy Cardiovascular: heart regular rate and rhythm, no murmur Lungs: clear to auscultation bilaterally Abdomen: soft, nontender, nondistended, no obvious ascites, no peritoneal signs, normal bowel sounds, no organomegaly Rectal: Grade 2 inflamed internal hemorrhoids.  Not currently prolapsed Extremities: no clubbing, cyanosis, or lower extremity edema bilaterally Skin: no lesions on visible extremities except for scars from prior surgery Neuro: No focal deficits.  Cranial nerves intact  ASSESSMENT:  1.  Prolapsing internal hemorrhoids 2.  History of adenomatous colon polyp September 2016 3.  Remote history of DVT without pulmonary embolism, on  Eliquis.  Seeing hematology regarding the need for further anticoagulation   PLAN:  1.  We discussed medical therapy, palliation therapy, and surgical therapy for hemorrhoids.  Recommended the following to start: 2.  Metamucil 2 tablespoons daily 2.  Sitz bath 3.  Prescribe Anusol HC suppositories.  1 at night.  Multiple refills given 4.   Reassess in 6 weeks.  The patient will contact the office with an update 5.  Surveillance colonoscopy due around September 2021.  Recall placed in system. 6.  Ongoing general medical care with his PCP and other specialists

## 2019-09-13 NOTE — Patient Instructions (Signed)
We have sent the following medications to your pharmacy for you to pick up at your convenience:  Anusol HC Cream.    Purchase over the counter Preparation H suppositories.  Apply a small amount of the hydrocortisone cream to a suppository and insert rectally.  Take 1-2 tablespoons of Metamucil daily.  Take sitz baths at home   You will be due for a colonoscopy in September

## 2019-11-01 DIAGNOSIS — H40013 Open angle with borderline findings, low risk, bilateral: Secondary | ICD-10-CM | POA: Diagnosis not present

## 2019-11-15 DIAGNOSIS — H40013 Open angle with borderline findings, low risk, bilateral: Secondary | ICD-10-CM | POA: Diagnosis not present

## 2019-11-24 DIAGNOSIS — M1711 Unilateral primary osteoarthritis, right knee: Secondary | ICD-10-CM | POA: Diagnosis not present

## 2019-11-24 DIAGNOSIS — M25561 Pain in right knee: Secondary | ICD-10-CM | POA: Diagnosis not present

## 2020-02-03 DIAGNOSIS — Z125 Encounter for screening for malignant neoplasm of prostate: Secondary | ICD-10-CM | POA: Diagnosis not present

## 2020-02-03 DIAGNOSIS — R7989 Other specified abnormal findings of blood chemistry: Secondary | ICD-10-CM | POA: Diagnosis not present

## 2020-02-03 DIAGNOSIS — N401 Enlarged prostate with lower urinary tract symptoms: Secondary | ICD-10-CM | POA: Diagnosis not present

## 2020-02-03 DIAGNOSIS — N529 Male erectile dysfunction, unspecified: Secondary | ICD-10-CM | POA: Diagnosis not present

## 2020-02-10 DIAGNOSIS — N529 Male erectile dysfunction, unspecified: Secondary | ICD-10-CM | POA: Diagnosis not present

## 2020-02-10 DIAGNOSIS — M1990 Unspecified osteoarthritis, unspecified site: Secondary | ICD-10-CM | POA: Diagnosis not present

## 2020-02-10 DIAGNOSIS — N200 Calculus of kidney: Secondary | ICD-10-CM | POA: Diagnosis not present

## 2020-02-10 DIAGNOSIS — Z86718 Personal history of other venous thrombosis and embolism: Secondary | ICD-10-CM | POA: Diagnosis not present

## 2020-02-10 DIAGNOSIS — R82998 Other abnormal findings in urine: Secondary | ICD-10-CM | POA: Diagnosis not present

## 2020-02-10 DIAGNOSIS — N401 Enlarged prostate with lower urinary tract symptoms: Secondary | ICD-10-CM | POA: Diagnosis not present

## 2020-02-10 DIAGNOSIS — Z23 Encounter for immunization: Secondary | ICD-10-CM | POA: Diagnosis not present

## 2020-02-10 DIAGNOSIS — Z0000 Encounter for general adult medical examination without abnormal findings: Secondary | ICD-10-CM | POA: Diagnosis not present

## 2020-02-10 DIAGNOSIS — Z1331 Encounter for screening for depression: Secondary | ICD-10-CM | POA: Diagnosis not present

## 2020-02-10 DIAGNOSIS — Z1389 Encounter for screening for other disorder: Secondary | ICD-10-CM | POA: Diagnosis not present

## 2020-03-01 ENCOUNTER — Other Ambulatory Visit: Payer: Self-pay | Admitting: Internal Medicine

## 2020-03-27 DIAGNOSIS — L820 Inflamed seborrheic keratosis: Secondary | ICD-10-CM | POA: Diagnosis not present

## 2020-04-17 DIAGNOSIS — R350 Frequency of micturition: Secondary | ICD-10-CM | POA: Diagnosis not present

## 2020-04-17 DIAGNOSIS — N390 Urinary tract infection, site not specified: Secondary | ICD-10-CM | POA: Diagnosis not present

## 2020-04-17 DIAGNOSIS — R351 Nocturia: Secondary | ICD-10-CM | POA: Diagnosis not present

## 2020-04-25 ENCOUNTER — Ambulatory Visit: Payer: Medicare PPO | Admitting: Internal Medicine

## 2020-04-25 ENCOUNTER — Encounter: Payer: Self-pay | Admitting: Internal Medicine

## 2020-04-25 VITALS — BP 100/70 | HR 62 | Ht 69.0 in | Wt 143.0 lb

## 2020-04-25 DIAGNOSIS — Z7901 Long term (current) use of anticoagulants: Secondary | ICD-10-CM | POA: Diagnosis not present

## 2020-04-25 DIAGNOSIS — K625 Hemorrhage of anus and rectum: Secondary | ICD-10-CM

## 2020-04-25 DIAGNOSIS — Z86010 Personal history of colonic polyps: Secondary | ICD-10-CM | POA: Diagnosis not present

## 2020-04-25 DIAGNOSIS — K648 Other hemorrhoids: Secondary | ICD-10-CM

## 2020-04-25 NOTE — Progress Notes (Signed)
HISTORY OF PRESENT ILLNESS:  Nicholas Terry is a 69 y.o. male, former Pharmacist, hospital and restaurant worker in [place], who was evaluated September 13, 2019 regarding prolapsing internal hemorrhoids, history of adenomatous colon polyp September 2016 (Dr. 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  Patient presents today regarding his need for colonoscopy and to discuss banding for his hemorrhoids.  At the time of his last visit he was treated with medicated suppositories, sitz baths, and Metamucil.  He did this for about 6 weeks without significant change.  He is anticipating shoulder surgery in 3 weeks.  Hematology has consistently reduced his anticoagulation dosage.  He has had no further problems with blood clots.  He is interested in having his colonoscopy in the spring.  He is also interested in band therapy for his hemorrhoids.  No interval new GI complaints.  He has completed his Covid vaccination series  REVIEW OF SYSTEMS:  All non-GI ROS negative unless otherwise stated in the HPI except for shoulder pain, arthritis, hip pain  Past Medical History:  Diagnosis Date  . Arthritis   . BPH (benign prostatic hyperplasia)   . Complication of anesthesia   . DVT (deep venous thrombosis) (HCC)    bil calf  . ED (erectile dysfunction)   . Foot mass, left    left plantar forefoot  . History of colon polyps   . History of kidney stones   . Internal hemorrhoids   . PONV (postoperative nausea and vomiting)   . Seasonal allergies     Past Surgical History:  Procedure Laterality Date  . JOINT REPLACEMENT Left    knee  . KIDNEY STONE SURGERY     x3  . KNEE ARTHROSCOPY Bilateral   . MASS EXCISION Left 03/11/2018   Procedure: Excisional biopsy of left forefoot mass;  Surgeon: Wylene Simmer, MD;  Location: [place];  Service: Orthopedics;  Laterality: Left;  60 mins  . SHOULDER ARTHROSCOPY Right   . TOTAL SHOULDER ARTHROPLASTY Left 05/27/2018  . TOTAL  SHOULDER ARTHROPLASTY Left 05/27/2018   Procedure: LEFT TOTAL SHOULDER ARTHROPLASTY;  Surgeon: Justice Britain, MD;  Location: [place];  Service: Orthopedics;  Laterality: Left;  189min    Social History Nicholas Terry  reports that he has never smoked. He has never used smokeless tobacco. He reports current alcohol use. He reports that he does not use drugs.  family history includes Parkinson's disease in his mother; Stroke in his father.  No Known Allergies     PHYSICAL EXAMINATION: Vital signs: BP 100/70   Pulse 62   Ht 5\' 9"  (1.753 m)   Wt 143 lb (64.9 kg)   BMI 21.12 kg/m   Constitutional: generally well-appearing, no acute distress Psychiatric: alert and oriented x3, cooperative Eyes: extraocular movements intact, anicteric, conjunctiva pink Mouth: oral pharynx moist, no lesions Neck: supple no lymphadenopathy Cardiovascular: heart regular rate and rhythm, no murmur Lungs: clear to auscultation bilaterally Abdomen: soft, nontender, nondistended, no obvious ascites, no peritoneal signs, normal bowel sounds, no organomegaly Rectal: Omitted Extremities: no clubbing, cyanosis, or lower extremity edema bilaterally Skin: no lesions on visible extremities Neuro: No focal deficits.  Cranial nerves intact  ASSESSMENT:  1.  History of adenomatous colon polyps September 2016 (Dr. Roxy Manns). 2.  Symptomatic hemorrhoids.  Previously defined as grade 2 prolapsing. 3.  Chronic anticoagulation due to history of DVT without PE  PLAN:  1.  Plan for colonoscopy in the spring 2022 as requested 2.  He may be  seen directly by a previsit nurse since I have evaluated him in the office.  He has been approved for coming off anticoagulation for his shoulder surgery.  We will hold his Xarelto for 3 days preop colonoscopy.  Patient is high risk due to the need to interrupt anticoagulation therapy.  He understands.The nature of the procedure, as well as the risks, benefits, and alternatives were carefully  and thoroughly reviewed with the patient. Ample time for discussion and questions allowed. The patient understood, was satisfied, and agreed to proceed. 3.  Refer to partner for banding procedure as outpatient, post colonoscopy.

## 2020-04-25 NOTE — Patient Instructions (Signed)
If you are age 70 or older, your body mass index should be between 23-30. Your Body mass index is 21.12 kg/m. If this is out of the aforementioned range listed, please consider follow up with your Primary Care Provider.  If you are age 89 or younger, your body mass index should be between 19-25. Your Body mass index is 21.12 kg/m. If this is out of the aformentioned range listed, please consider follow up with your Primary Care Provider.   You will be due for a recall colonoscopy in 09-2020. We will send you a reminder in the mail when it gets closer to that time.  It was a pleasure to see you today!  Dr. Henrene Pastor

## 2020-04-26 DIAGNOSIS — S76012A Strain of muscle, fascia and tendon of left hip, initial encounter: Secondary | ICD-10-CM | POA: Diagnosis not present

## 2020-04-26 DIAGNOSIS — M25552 Pain in left hip: Secondary | ICD-10-CM | POA: Diagnosis not present

## 2020-04-26 DIAGNOSIS — M1612 Unilateral primary osteoarthritis, left hip: Secondary | ICD-10-CM | POA: Diagnosis not present

## 2020-05-02 DIAGNOSIS — M1612 Unilateral primary osteoarthritis, left hip: Secondary | ICD-10-CM | POA: Diagnosis not present

## 2020-05-07 NOTE — Progress Notes (Signed)
DUE TO COVID-19 ONLY ONE VISITOR IS ALLOWED TO COME WITH YOU AND STAY IN THE WAITING ROOM ONLY DURING PRE OP AND PROCEDURE DAY OF SURGERY. THE 1 VISITOR  MAY VISIT WITH YOU AFTER SURGERY IN YOUR PRIVATE ROOM DURING VISITING HOURS ONLY!  YOU NEED TO HAVE A COVID 19 TEST ON_10/25/2021 ______ @_______ , THIS TEST MUST BE DONE BEFORE SURGERY,  COVID TESTING SITE [address] [place] [address], IT IS ON THE RIGHT GOING OUT [address] APPROXIMATELY  2 MINUTES PAST [employer] ON THE RIGHT. ONCE YOUR COVID TEST IS COMPLETED,  PLEASE BEGIN THE QUARANTINE INSTRUCTIONS AS OUTLINED IN YOUR HANDOUT.                Nicholas Terry  05/07/2020   Your procedure is scheduled on: 05/17/2020    Report to [facility] Main  Entrance   Report to admitting at    0530 AM     Call this number if you have problems the morning of surgery [phone]    REMEMBER: NO  SOLID FOOD CANDY OR GUM AFTER MIDNIGHT. CLEAR LIQUIDS [id number]         . NOTHING BY MOUTH EXCEPT CLEAR LIQUIDS UNTIL    . PLEASE FINISH ENSURE DRINK PER SURGEON ORDER  WHICH NEEDS TO BE COMPLETED AT   0430 am    .      CLEAR LIQUID DIET   Foods Allowed                                                                    Coffee and tea, regular and decaf                            Fruit ices (not with fruit pulp)                                      Iced Popsicles                                    Carbonated beverages, regular and diet                                    Cranberry, grape and apple juices Sports drinks like Gatorade Lightly seasoned clear broth or consume(fat free) Sugar, honey syrup ___________________________________________________________________      BRUSH YOUR TEETH MORNING OF SURGERY AND RINSE YOUR MOUTH OUT, NO CHEWING GUM CANDY OR MINTS.     Take these medicines the morning of surgery with A SIP OF WATER: claritin                                  You may not have any metal on  your body including hair pins and              piercings  Do not wear jewelry, make-up, lotions, powders or perfumes, deodorant  Do not wear nail polish on your fingernails.  Do not shave  48 hours prior to surgery.              Men may shave face and neck.   Do not bring valuables to the hospital. [facility].  Contacts, dentures or bridgework may not be worn into surgery.  Leave suitcase in the car. After surgery it may be brought to your room.     Patients discharged the day of surgery will not be allowed to drive home. IF YOU ARE HAVING SURGERY AND GOING HOME THE SAME DAY, YOU MUST HAVE AN ADULT TO DRIVE YOU HOME AND BE WITH YOU FOR 24 HOURS. YOU MAY GO HOME BY TAXI OR UBER OR ORTHERWISE, BUT AN ADULT MUST ACCOMPANY YOU HOME AND STAY WITH YOU FOR 24 HOURS.  Name and phone number of your driver:  Special Instructions: N/A              Please read over the following fact sheets you were given: _____________________________________________________________________  [facility] - Preparing for Surgery Before surgery, you can play an important role.  Because skin is not sterile, your skin needs to be as free of germs as possible.  You can reduce the number of germs on your skin by washing with CHG (chlorahexidine gluconate) soap before surgery.  CHG is an antiseptic cleaner which kills germs and bonds with the skin to continue killing germs even after washing. Please DO NOT use if you have an allergy to CHG or antibacterial soaps.  If your skin becomes reddened/irritated stop using the CHG and inform your nurse when you arrive at Short Stay. Do not shave (including legs and underarms) for at least 48 hours prior to the first CHG shower.  You may shave your face/neck. Please follow these instructions carefully:  1.  Shower with CHG Soap the night before surgery and the  morning of Surgery.  2.  If you choose to wash your hair, wash your  hair first as usual with your  normal  shampoo.  3.  After you shampoo, rinse your hair and body thoroughly to remove the  shampoo.                           4.  Use CHG as you would any other liquid soap.  You can apply chg directly  to the skin and wash                       Gently with a scrungie or clean washcloth.  5.  Apply the CHG Soap to your body ONLY FROM THE NECK DOWN.   Do not use on face/ open                           Wound or open sores. Avoid contact with eyes, ears mouth and genitals (private parts).                       Wash face,  Genitals (private parts) with your normal soap.             6.  Wash thoroughly, paying special attention to the area where your surgery  will be performed.  7.  Thoroughly rinse your body with  warm water from the neck down.  8.  DO NOT shower/wash with your normal soap after using and rinsing off  the CHG Soap.                9.  Pat yourself dry with a clean towel.            10.  Wear clean pajamas.            11.  Place clean sheets on your bed the night of your first shower and do not  sleep with pets. Day of Surgery : Do not apply any lotions/deodorants the morning of surgery.  Please wear clean clothes to the hospital/surgery center.  FAILURE TO FOLLOW THESE INSTRUCTIONS MAY RESULT IN THE CANCELLATION OF YOUR SURGERY PATIENT SIGNATURE_________________________________  NURSE SIGNATURE__________________________________  ________________________________________________________________________

## 2020-05-09 ENCOUNTER — Encounter (HOSPITAL_COMMUNITY)
Admission: RE | Admit: 2020-05-09 | Discharge: 2020-05-09 | Disposition: A | Discharge status: Home / Self Care | Payer: Medicare PPO | Source: Ambulatory Visit | Attending: Orthopedic Surgery | Admitting: Orthopedic Surgery

## 2020-05-09 ENCOUNTER — Other Ambulatory Visit: Payer: Self-pay

## 2020-05-09 ENCOUNTER — Encounter (HOSPITAL_COMMUNITY): Payer: Self-pay

## 2020-05-09 DIAGNOSIS — Z96612 Presence of left artificial shoulder joint: Secondary | ICD-10-CM | POA: Insufficient documentation

## 2020-05-09 DIAGNOSIS — Z791 Long term (current) use of non-steroidal anti-inflammatories (NSAID): Secondary | ICD-10-CM | POA: Insufficient documentation

## 2020-05-09 DIAGNOSIS — Z792 Long term (current) use of antibiotics: Secondary | ICD-10-CM | POA: Diagnosis not present

## 2020-05-09 DIAGNOSIS — I824Z3 Acute embolism and thrombosis of unspecified deep veins of distal lower extremity, bilateral: Secondary | ICD-10-CM | POA: Diagnosis not present

## 2020-05-09 DIAGNOSIS — N400 Enlarged prostate without lower urinary tract symptoms: Secondary | ICD-10-CM | POA: Insufficient documentation

## 2020-05-09 DIAGNOSIS — Z01818 Encounter for other preprocedural examination: Secondary | ICD-10-CM | POA: Insufficient documentation

## 2020-05-09 DIAGNOSIS — Z96652 Presence of left artificial knee joint: Secondary | ICD-10-CM | POA: Diagnosis not present

## 2020-05-09 DIAGNOSIS — Z7901 Long term (current) use of anticoagulants: Secondary | ICD-10-CM | POA: Diagnosis not present

## 2020-05-09 DIAGNOSIS — M19011 Primary osteoarthritis, right shoulder: Secondary | ICD-10-CM | POA: Diagnosis not present

## 2020-05-09 DIAGNOSIS — M25552 Pain in left hip: Secondary | ICD-10-CM | POA: Diagnosis not present

## 2020-05-09 HISTORY — DX: Pneumonia, unspecified organism: J18.9

## 2020-05-09 LAB — SURGICAL PCR SCREEN
MRSA, PCR: NEGATIVE
Staphylococcus aureus: NEGATIVE

## 2020-05-09 LAB — BASIC METABOLIC PANEL
Anion gap: 10 (ref 5–15)
BUN: 23 mg/dL (ref 8–23)
CO2: 26 mmol/L (ref 22–32)
Calcium: 9.2 mg/dL (ref 8.9–10.3)
Chloride: 103 mmol/L (ref 98–111)
Creatinine, Ser: 0.89 mg/dL (ref 0.61–1.24)
GFR, Estimated: >60 mL/min (ref >60)
Glucose, Bld: 108 mg/dL — ABNORMAL HIGH (ref 70–99)
Potassium: 4.2 mmol/L (ref 3.5–5.1)
Sodium: 139 mmol/L (ref 135–145)

## 2020-05-09 LAB — CBC
HCT: 46.1 % (ref 39.0–52.0)
Hemoglobin: 15.2 g/dL (ref 13.0–17.0)
MCH: 32.8 pg (ref 26.0–34.0)
MCHC: 33 g/dL (ref 30.0–36.0)
MCV: 99.4 fL (ref 80.0–100.0)
Platelets: 228 10*3/uL (ref 150–400)
RBC: 4.64 MIL/uL (ref 4.22–5.81)
RDW: 12.9 % (ref 11.5–15.5)
WBC: 5.1 10*3/uL (ref 4.0–10.5)
nRBC: 0 % (ref 0.0–0.2)

## 2020-05-09 NOTE — Progress Notes (Addendum)
Anesthesia Review:  PCP: Dr Lillie [facility]  Cardiologist : none  Chest x-ray : EKG :05/09/20  Echo : Stress test: Cardiac Cath :  Activity level: can do a flight of stairs without difficulty  Sleep Study/ CPAP :no  Fasting Blood Sugar :      / Checks Blood Sugar -- times a day:   Blood Thinner/ Instructions /Last Dose: ASA / Instructions/ Last Dose :  Pt on Xarelto for hx of DVT followed by pcp- patient has not yet received preop instructions regarrding Xarelto at time of preop appt.  Patient to call PCP on 05/09/20 in regards to this.  I called office of Dr Onnie Graham and LVMM with [facility] Chard in regards to this.  Clearance from hematology - DR Irene Limbo placed wiith Roanna Banning .  Xarelto preop instructions are in clearance.

## 2020-05-09 NOTE — Progress Notes (Signed)
[place]- Preparing for Total Shoulder Arthroplasty    Before surgery, you can play an important role. Because skin is not sterile, your skin needs to be as free of germs as possible. You can reduce the number of germs on your skin by using the following products. . Benzoyl Peroxide Gel o Reduces the number of germs present on the skin o Applied twice a day to shoulder area starting two days before surgery    ==================================================================  Please follow these instructions carefully:  BENZOYL PEROXIDE 5% GEL  Please do not use if you have an allergy to benzoyl peroxide.   If your skin becomes reddened/irritated stop using the benzoyl peroxide.  Starting two days before surgery, apply as follows: 1. Apply benzoyl peroxide in the morning and at night. Apply after taking a shower. If you are not taking a shower clean entire shoulder front, back, and side along with the armpit with a clean wet washcloth.  2. Place a quarter-sized dollop on your shoulder and rub in thoroughly, making sure to cover the front, back, and side of your shoulder, along with the armpit.   2 days before ____ AM   ____ PM              1 day before ____ AM   ____ PM                         3. Do this twice a day for two days.  (Last application is the night before surgery, AFTER using the CHG soap as described below).  4. Do NOT apply benzoyl peroxide gel on the day of surgery.

## 2020-05-10 NOTE — Progress Notes (Signed)
Anesthesia Chart Review   Case: [id number] Date/Time: 05/17/20 0715   Procedure: TOTAL SHOULDER ARTHROPLASTY (Right Shoulder) - 112min   Anesthesia type: General   Pre-op diagnosis: Right shoulder osteoarthritis   [facility] ROOM 06 / [facility]   Surgeons: Justice Britain, MD      DISCUSSION:69 y.o. 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 Justice Britain.    Per clearance on chart pt is cleared for surgery with the following recommendations, "1) TDAP is due July 2022, consider giving perioperatively. 2) Need bridging plan, if indicated, from Dr. Irene Limbo (hematology). 3) Covid booster likely due ~October if FDA/CDC recommended 4) Per Dr. Irene Limbo "hold Xarelto 48h pre-op without bridging and restart VTE ppx with Xarelto or Lovenox ASAP post operative after hemostasis confirmed and return to Xarelto 10mg  po daily on post-operative discharge."  Anticipate pt can proceed with planned procedure barring acute status change.    VS: BP 119/82   Pulse 65   Temp 36.5 C (Oral)   Resp 16   Ht 5\' 9"  (1.753 m)   Wt 64.9 kg   SpO2 99%   BMI 21.12 kg/m   PROVIDERS: Sueanne Margarita, DO is PCP   Brunetta Genera, MD is hematologist  LABS: Labs reviewed: Acceptable for surgery. (all labs ordered are listed, but only abnormal results are displayed)  Labs Reviewed  BASIC METABOLIC PANEL - Abnormal; Notable for the following components:      Result Value   Glucose, Bld 108 (*)    All other components within normal limits  SURGICAL PCR SCREEN  CBC     IMAGES:   EKG: 05/09/20 Rate 65 bpm  NSR  CV:  Past Medical History:  Diagnosis Date  . Arthritis   . BPH (benign prostatic hyperplasia)   . Complication of anesthesia   . DVT (deep venous thrombosis) (HCC)    bil calf  . ED (erectile dysfunction)   . Foot mass, left    left plantar forefoot  . History of colon polyps   . History of kidney stones   .  Internal hemorrhoids   . Pneumonia    hx of   . PONV (postoperative nausea and vomiting)   . Seasonal allergies     Past Surgical History:  Procedure Laterality Date  . JOINT REPLACEMENT Left    knee  . KIDNEY STONE SURGERY     x3  . KNEE ARTHROSCOPY Bilateral   . MASS EXCISION Left 03/11/2018   Procedure: Excisional biopsy of left forefoot mass;  Surgeon: Wylene Simmer, MD;  Location: [place];  Service: Orthopedics;  Laterality: Left;  60 mins  . SHOULDER ARTHROSCOPY Right   . TOTAL SHOULDER ARTHROPLASTY Left 05/27/2018  . TOTAL SHOULDER ARTHROPLASTY Left 05/27/2018   Procedure: LEFT TOTAL SHOULDER ARTHROPLASTY;  Surgeon: Justice Britain, MD;  Location: [place];  Service: Orthopedics;  Laterality: Left;  169min    MEDICATIONS: . acetaminophen (TYLENOL) 650 MG CR tablet  . APPLE CIDER VINEGAR PO  . Carboxymethylcellul-Glycerin ([place] OP)  . hydrocortisone (ANUSOL-HC) 2.5 % rectal cream  . loratadine (CLARITIN) 10 MG tablet  . Multiple Vitamin (MULTIVITAMIN WITH MINERALS) TABS tablet  . OVER THE COUNTER MEDICATION  . OVER THE COUNTER MEDICATION  . Saw Palmetto, Serenoa repens, (SAW PALMETTO PO)  . sulfamethoxazole-trimethoprim (BACTRIM DS) 800-160 MG tablet  . traMADol (ULTRAM) 50 MG tablet  . XARELTO 10 MG TABS tablet  No current facility-administered medications for this encounter.     Konrad Felix, PA-C [facility] Pre-Surgical Testing [phone]

## 2020-05-12 DIAGNOSIS — Z23 Encounter for immunization: Secondary | ICD-10-CM | POA: Diagnosis not present

## 2020-05-14 ENCOUNTER — Other Ambulatory Visit (HOSPITAL_COMMUNITY)
Admission: RE | Admit: 2020-05-14 | Discharge: 2020-05-14 | Disposition: A | Discharge status: Home / Self Care | Payer: Medicare PPO | Source: Ambulatory Visit | Attending: Orthopedic Surgery | Admitting: Orthopedic Surgery

## 2020-05-14 DIAGNOSIS — Z01812 Encounter for preprocedural laboratory examination: Secondary | ICD-10-CM | POA: Diagnosis not present

## 2020-05-14 DIAGNOSIS — Z20822 Contact with and (suspected) exposure to covid-19: Secondary | ICD-10-CM | POA: Insufficient documentation

## 2020-05-14 LAB — SARS CORONAVIRUS 2 (TAT 6-24 HRS): SARS Coronavirus 2: NEGATIVE

## 2020-05-15 DIAGNOSIS — H40021 Open angle with borderline findings, high risk, right eye: Secondary | ICD-10-CM | POA: Diagnosis not present

## 2020-05-15 DIAGNOSIS — H40012 Open angle with borderline findings, low risk, left eye: Secondary | ICD-10-CM | POA: Diagnosis not present

## 2020-05-16 NOTE — Anesthesia Preprocedure Evaluation (Addendum)
Anesthesia Evaluation  Patient identified by MRN, date of birth, ID band Patient awake    Reviewed: Allergy & Precautions, NPO status , Patient's Chart, lab work & pertinent test results  History of Anesthesia Complications (+) PONV  Airway Mallampati: II  TM Distance: >3 FB Neck ROM: Full    Dental no notable dental hx. (+) Teeth Intact, Dental Advisory Given   Pulmonary neg pulmonary ROS,    Pulmonary exam normal breath sounds clear to auscultation       Cardiovascular + DVT (hx on Xarelto)  Normal cardiovascular exam Rhythm:Regular Rate:Normal  1020 EKG SR R 65   Neuro/Psych negative neurological ROS  negative psych ROS   GI/Hepatic negative GI ROS, Neg liver ROS,   Endo/Other  negative endocrine ROS  Renal/GU negative Renal ROSK+ 4.2 Cr 0.89     Musculoskeletal  (+) Arthritis ,   Abdominal   Peds  Hematology negative hematology ROS (+) Hgb 15.2 Plt 228   Anesthesia Other Findings NKDA   Reproductive/Obstetrics                            Anesthesia Physical Anesthesia Plan  ASA: II  Anesthesia Plan: General   Post-op Pain Management:  Regional for Post-op pain   Induction: Intravenous  PONV Risk Score and Plan: 4 or greater and Treatment may vary due to age or medical condition, Ondansetron, Dexamethasone and Midazolam  Airway Management Planned: Oral ETT  Additional Equipment: None  Intra-op Plan:   Post-operative Plan: Extubation in OR  Informed Consent: I have reviewed the patients History and Physical, chart, labs and discussed the procedure including the risks, benefits and alternatives for the proposed anesthesia with the patient or authorized representative who has indicated his/her understanding and acceptance.     Dental advisory given  Plan Discussed with: Anesthesiologist and CRNA  Anesthesia Plan Comments: (GA w R ISB w Exparel)       Anesthesia  Quick Evaluation

## 2020-05-17 ENCOUNTER — Encounter (HOSPITAL_COMMUNITY): Payer: Self-pay | Admitting: Orthopedic Surgery

## 2020-05-17 ENCOUNTER — Ambulatory Visit (HOSPITAL_COMMUNITY): Payer: Medicare PPO | Admitting: Anesthesiology

## 2020-05-17 ENCOUNTER — Ambulatory Visit (HOSPITAL_COMMUNITY): Payer: Medicare PPO | Admitting: Physician Assistant

## 2020-05-17 ENCOUNTER — Encounter (HOSPITAL_COMMUNITY)
Admission: RE | Disposition: A | Discharge status: Home / Self Care | Payer: Self-pay | Source: Home / Self Care | Attending: Orthopedic Surgery

## 2020-05-17 ENCOUNTER — Ambulatory Visit (HOSPITAL_COMMUNITY)
Admission: RE | Admit: 2020-05-17 | Discharge: 2020-05-17 | Disposition: A | Discharge status: Home / Self Care | Payer: Medicare PPO | Attending: Orthopedic Surgery | Admitting: Orthopedic Surgery

## 2020-05-17 DIAGNOSIS — I82409 Acute embolism and thrombosis of unspecified deep veins of unspecified lower extremity: Secondary | ICD-10-CM | POA: Diagnosis not present

## 2020-05-17 DIAGNOSIS — M25711 Osteophyte, right shoulder: Secondary | ICD-10-CM | POA: Insufficient documentation

## 2020-05-17 DIAGNOSIS — Z96612 Presence of left artificial shoulder joint: Secondary | ICD-10-CM | POA: Insufficient documentation

## 2020-05-17 DIAGNOSIS — M19011 Primary osteoarthritis, right shoulder: Secondary | ICD-10-CM | POA: Diagnosis not present

## 2020-05-17 DIAGNOSIS — G8918 Other acute postprocedural pain: Secondary | ICD-10-CM | POA: Diagnosis not present

## 2020-05-17 HISTORY — PX: TOTAL SHOULDER ARTHROPLASTY: SHX126

## 2020-05-17 SURGERY — ARTHROPLASTY, SHOULDER, TOTAL
Anesthesia: General | Site: Shoulder | Laterality: Right

## 2020-05-17 MED ORDER — PROPOFOL 10 MG/ML IV BOLUS
INTRAVENOUS | Status: DC | PRN
  Administered 2020-05-17: 120 mg via INTRAVENOUS

## 2020-05-17 MED ORDER — BUPIVACAINE LIPOSOME 1.3 % IJ SUSP
INTRAMUSCULAR | Status: DC | PRN
  Administered 2020-05-17: 10 mL via PERINEURAL

## 2020-05-17 MED ORDER — ONDANSETRON HCL 4 MG/2ML IJ SOLN
INTRAMUSCULAR | Status: AC
  Filled 2020-05-17: qty 2

## 2020-05-17 MED ORDER — METOCLOPRAMIDE HCL 5 MG PO TABS
5.0000 mg | ORAL_TABLET | Freq: Three times a day (TID) | ORAL | Status: DC | PRN
  Filled 2020-05-17: qty 2

## 2020-05-17 MED ORDER — ONDANSETRON HCL 4 MG PO TABS
4.0000 mg | ORAL_TABLET | Freq: Four times a day (QID) | ORAL | Status: DC | PRN
  Filled 2020-05-17: qty 1

## 2020-05-17 MED ORDER — OXYCODONE-ACETAMINOPHEN 5-325 MG PO TABS: 1.0000 | ORAL_TABLET | ORAL | 0 refills | Status: DC | PRN

## 2020-05-17 MED ORDER — PROPOFOL 10 MG/ML IV BOLUS
INTRAVENOUS | Status: AC
  Filled 2020-05-17: qty 20

## 2020-05-17 MED ORDER — ROCURONIUM BROMIDE 10 MG/ML (PF) SYRINGE
PREFILLED_SYRINGE | INTRAVENOUS | Status: AC
  Filled 2020-05-17: qty 10

## 2020-05-17 MED ORDER — FENTANYL CITRATE (PF) 100 MCG/2ML IJ SOLN: 25.0000 ug | INTRAMUSCULAR | Status: DC | PRN

## 2020-05-17 MED ORDER — MIDAZOLAM HCL 5 MG/5ML IJ SOLN
INTRAMUSCULAR | Status: DC | PRN
  Administered 2020-05-17: 2 mg via INTRAVENOUS

## 2020-05-17 MED ORDER — DEXAMETHASONE SODIUM PHOSPHATE 10 MG/ML IJ SOLN
INTRAMUSCULAR | Status: DC | PRN
  Administered 2020-05-17: 10 mg via INTRAVENOUS

## 2020-05-17 MED ORDER — CEFAZOLIN SODIUM-DEXTROSE 2-4 GM/100ML-% IV SOLN
2.0000 g | INTRAVENOUS | Status: AC
  Administered 2020-05-17: 2 g via INTRAVENOUS
  Filled 2020-05-17: qty 100

## 2020-05-17 MED ORDER — MIDAZOLAM HCL 2 MG/2ML IJ SOLN
INTRAMUSCULAR | Status: AC
  Filled 2020-05-17: qty 2

## 2020-05-17 MED ORDER — EPHEDRINE SULFATE-NACL 50-0.9 MG/10ML-% IV SOSY
PREFILLED_SYRINGE | INTRAVENOUS | Status: DC | PRN
  Administered 2020-05-17 (×3): 10 mg via INTRAVENOUS

## 2020-05-17 MED ORDER — STERILE WATER FOR IRRIGATION IR SOLN
Status: DC | PRN
  Administered 2020-05-17: 2000 mL

## 2020-05-17 MED ORDER — FENTANYL CITRATE (PF) 100 MCG/2ML IJ SOLN
INTRAMUSCULAR | Status: AC
  Filled 2020-05-17: qty 2

## 2020-05-17 MED ORDER — BUPIVACAINE-EPINEPHRINE (PF) 0.5% -1:200000 IJ SOLN
INTRAMUSCULAR | Status: DC | PRN
  Administered 2020-05-17: 14 mL via PERINEURAL

## 2020-05-17 MED ORDER — METOCLOPRAMIDE HCL 5 MG/ML IJ SOLN: 5.0000 mg | Freq: Three times a day (TID) | INTRAMUSCULAR | Status: DC | PRN

## 2020-05-17 MED ORDER — CHLORHEXIDINE GLUCONATE 0.12 % MT SOLN: 15.0000 mL | Freq: Once | OROMUCOSAL | Status: AC

## 2020-05-17 MED ORDER — FENTANYL CITRATE (PF) 100 MCG/2ML IJ SOLN
INTRAMUSCULAR | Status: DC | PRN
  Administered 2020-05-17: 100 ug via INTRAVENOUS
  Administered 2020-05-17 (×2): 50 ug via INTRAVENOUS

## 2020-05-17 MED ORDER — ONDANSETRON HCL 4 MG/2ML IJ SOLN
4.0000 mg | Freq: Four times a day (QID) | INTRAMUSCULAR | Status: DC | PRN
  Administered 2020-05-17: 4 mg via INTRAVENOUS

## 2020-05-17 MED ORDER — ONDANSETRON HCL 4 MG/2ML IJ SOLN
INTRAMUSCULAR | Status: DC | PRN
  Administered 2020-05-17: 4 mg via INTRAVENOUS

## 2020-05-17 MED ORDER — ACETAMINOPHEN 10 MG/ML IV SOLN: 1000.0000 mg | Freq: Once | INTRAVENOUS | Status: DC | PRN

## 2020-05-17 MED ORDER — ONDANSETRON HCL 4 MG/2ML IJ SOLN: 4.0000 mg | Freq: Once | INTRAMUSCULAR | Status: DC | PRN

## 2020-05-17 MED ORDER — CYCLOBENZAPRINE HCL 10 MG PO TABS: 10.0000 mg | ORAL_TABLET | Freq: Three times a day (TID) | ORAL | 1 refills | Status: DC | PRN

## 2020-05-17 MED ORDER — ORAL CARE MOUTH RINSE
15.0000 mL | Freq: Once | OROMUCOSAL | Status: AC
  Administered 2020-05-17: 15 mL via OROMUCOSAL

## 2020-05-17 MED ORDER — LIDOCAINE 2% (20 MG/ML) 5 ML SYRINGE
INTRAMUSCULAR | Status: DC | PRN
  Administered 2020-05-17: 60 mg via INTRAVENOUS

## 2020-05-17 MED ORDER — VANCOMYCIN HCL 1000 MG IV SOLR
INTRAVENOUS | Status: AC
  Filled 2020-05-17: qty 1000

## 2020-05-17 MED ORDER — LIDOCAINE 2% (20 MG/ML) 5 ML SYRINGE
INTRAMUSCULAR | Status: AC
  Filled 2020-05-17: qty 5

## 2020-05-17 MED ORDER — TRANEXAMIC ACID-NACL 1000-0.7 MG/100ML-% IV SOLN
1000.0000 mg | INTRAVENOUS | Status: AC
  Administered 2020-05-17: 1000 mg via INTRAVENOUS
  Filled 2020-05-17: qty 100

## 2020-05-17 MED ORDER — VANCOMYCIN HCL 1000 MG IV SOLR
INTRAVENOUS | Status: DC | PRN
  Administered 2020-05-17: 1000 mg

## 2020-05-17 MED ORDER — LACTATED RINGERS IV BOLUS
250.0000 mL | Freq: Once | INTRAVENOUS | Status: AC
  Administered 2020-05-17: 250 mL via INTRAVENOUS

## 2020-05-17 MED ORDER — 0.9 % SODIUM CHLORIDE (POUR BTL) OPTIME
TOPICAL | Status: DC | PRN
  Administered 2020-05-17: 1000 mL

## 2020-05-17 MED ORDER — ONDANSETRON HCL 4 MG PO TABS: 4.0000 mg | ORAL_TABLET | Freq: Three times a day (TID) | ORAL | 0 refills | Status: DC | PRN

## 2020-05-17 MED ORDER — LACTATED RINGERS IV SOLN: INTRAVENOUS | Status: DC

## 2020-05-17 MED ORDER — DEXAMETHASONE SODIUM PHOSPHATE 10 MG/ML IJ SOLN
INTRAMUSCULAR | Status: AC
  Filled 2020-05-17: qty 1

## 2020-05-17 MED ORDER — LACTATED RINGERS IV BOLUS
500.0000 mL | Freq: Once | INTRAVENOUS | Status: AC
  Administered 2020-05-17: 500 mL via INTRAVENOUS

## 2020-05-17 MED ORDER — TRAMADOL HCL 50 MG PO TABS: 50.0000 mg | ORAL_TABLET | Freq: Four times a day (QID) | ORAL | 0 refills | Status: DC | PRN

## 2020-05-17 MED ORDER — ROCURONIUM BROMIDE 10 MG/ML (PF) SYRINGE
PREFILLED_SYRINGE | INTRAVENOUS | Status: DC | PRN
  Administered 2020-05-17: 50 mg via INTRAVENOUS
  Administered 2020-05-17 (×2): 10 mg via INTRAVENOUS
  Administered 2020-05-17: 20 mg via INTRAVENOUS
  Administered 2020-05-17: 10 mg via INTRAVENOUS

## 2020-05-17 SURGICAL SUPPLY — 67 items
BAG ZIPLOCK 12X15 (MISCELLANEOUS) ×2 IMPLANT
BIT DRILL 2.0X128 (BIT) ×4 IMPLANT
BIT DRILL 2.5 CANN ENDOSCOPIC (BIT) ×2 IMPLANT
BLADE SAW SGTL 83.5X18.5 (BLADE) ×2 IMPLANT
BODY TRUNION ECLIPSE 45 SL (Shoulder) ×2 IMPLANT
CEMENT BONE DEPUY (Cement) ×2 IMPLANT
COOLER ICEMAN CLASSIC (MISCELLANEOUS) IMPLANT
COVER BACK TABLE 60X90IN (DRAPES) ×2 IMPLANT
COVER SURGICAL LIGHT HANDLE (MISCELLANEOUS) ×2 IMPLANT
COVER WAND RF STERILE (DRAPES) IMPLANT
DERMABOND ADVANCED (GAUZE/BANDAGES/DRESSINGS) ×1
DERMABOND ADVANCED .7 DNX12 (GAUZE/BANDAGES/DRESSINGS) ×1 IMPLANT
DRAPE ORTHO SPLIT 77X108 STRL (DRAPES) ×2
DRAPE SHEET LG 3/4 BI-LAMINATE (DRAPES) ×2 IMPLANT
DRAPE SURG 17X11 SM STRL (DRAPES) ×2 IMPLANT
DRAPE SURG ORHT 6 SPLT 77X108 (DRAPES) ×2 IMPLANT
DRAPE U-SHAPE 47X51 STRL (DRAPES) ×2 IMPLANT
DRSG AQUACEL AG ADV 3.5X 6 (GAUZE/BANDAGES/DRESSINGS) ×2 IMPLANT
DRSG AQUACEL AG ADV 3.5X10 (GAUZE/BANDAGES/DRESSINGS) ×2 IMPLANT
DURAPREP 26ML APPLICATOR (WOUND CARE) ×2 IMPLANT
ELECT BLADE TIP CTD 4 INCH (ELECTRODE) ×2 IMPLANT
ELECT REM PT RETURN 15FT ADLT (MISCELLANEOUS) ×2 IMPLANT
FACESHIELD WRAPAROUND (MASK) ×8 IMPLANT
GLENOID UNI VAULTLOCK LRG (Shoulder) ×2 IMPLANT
GLOVE BIO SURGEON STRL SZ7.5 (GLOVE) ×2 IMPLANT
GLOVE BIO SURGEON STRL SZ8 (GLOVE) ×2 IMPLANT
GLOVE SS BIOGEL STRL SZ 7 (GLOVE) ×2 IMPLANT
GLOVE SS BIOGEL STRL SZ 7.5 (GLOVE) ×2 IMPLANT
GLOVE SUPERSENSE BIOGEL SZ 7 (GLOVE) ×2
GLOVE SUPERSENSE BIOGEL SZ 7.5 (GLOVE) ×2
GOWN STRL REUS W/TWL LRG LVL3 (GOWN DISPOSABLE) ×4 IMPLANT
HEAD HUM UNIV 15X45 3D (Head) ×2 IMPLANT
HEAD HUMERAL ECLIPSE 45/17 (Shoulder) ×2 IMPLANT
IMPL ECLIPSE SPEEDCAP (Shoulder) ×1 IMPLANT
IMPLANT ECLIPSE SPEEDCAP (Shoulder) ×2 IMPLANT
KIT BASIN OR (CUSTOM PROCEDURE TRAY) ×2 IMPLANT
KIT TURNOVER KIT A (KITS) IMPLANT
MANIFOLD NEPTUNE II (INSTRUMENTS) ×2 IMPLANT
NEEDLE TAPERED W/ NITINOL LOOP (MISCELLANEOUS) IMPLANT
NS IRRIG 1000ML POUR BTL (IV SOLUTION) ×2 IMPLANT
PACK SHOULDER (CUSTOM PROCEDURE TRAY) ×2 IMPLANT
PAD COLD SHLDR WRAP-ON (PAD) ×2 IMPLANT
PIN NITINOL TARGETER 2.8 (PIN) IMPLANT
PIN SET MODULAR GLENOID SYSTEM (PIN) ×2 IMPLANT
PROTECTOR NERVE ULNAR (MISCELLANEOUS) ×2 IMPLANT
RESTRAINT HEAD UNIVERSAL NS (MISCELLANEOUS) ×2 IMPLANT
SCREW SPINAL 40 F/CAGE LRG (Screw) ×2 IMPLANT
SIZER ECLIPSE CAGE SCREW (ORTHOPEDIC DISPOSABLE SUPPLIES) ×2 IMPLANT
SLING ARM FOAM STRAP LRG (SOFTGOODS) ×2 IMPLANT
SLING ARM FOAM STRAP MED (SOFTGOODS) IMPLANT
SMARTMIX MINI TOWER (MISCELLANEOUS)
SPONGE LAP 18X18 RF (DISPOSABLE) IMPLANT
STEM HUM UNIV 13X151 (Stem) ×2 IMPLANT
STEM HUMERAL APEX UNI 12MM (Stem) ×2 IMPLANT
SUCTION FRAZIER HANDLE 12FR (TUBING) ×1
SUCTION TUBE FRAZIER 12FR DISP (TUBING) ×1 IMPLANT
SUT FIBERWIRE #2 38 T-5 BLUE (SUTURE) ×2
SUT MNCRL AB 3-0 PS2 18 (SUTURE) ×2 IMPLANT
SUT MON AB 2-0 CT1 36 (SUTURE) ×2 IMPLANT
SUT VIC AB 1 CT1 36 (SUTURE) ×2 IMPLANT
SUTURE FIBERWR #2 38 T-5 BLUE (SUTURE) ×1 IMPLANT
SUTURE TAPE 1.3 40 TPR END (SUTURE) IMPLANT
SUTURETAPE 1.3 40 TPR END (SUTURE)
TOWEL OR 17X26 10 PK STRL BLUE (TOWEL DISPOSABLE) ×2 IMPLANT
TOWEL OR NON WOVEN STRL DISP B (DISPOSABLE) ×2 IMPLANT
TOWER SMARTMIX MINI (MISCELLANEOUS) IMPLANT
WATER STERILE IRR 1000ML POUR (IV SOLUTION) ×4 IMPLANT

## 2020-05-17 NOTE — H&P (Signed)
Nicholas Terry    Chief Complaint: Right shoulder osteoarthritis HPI: The patient is a 69 y.o. male with chronic and progressively increasing right shoulder pain secondary to end stage arthritis, refractory to prolonged attempts at conservative management  Past Medical History:  Diagnosis Date  . Arthritis   . BPH (benign prostatic hyperplasia)   . Complication of anesthesia   . DVT (deep venous thrombosis) (HCC)    bil calf  . ED (erectile dysfunction)   . Foot mass, left    left plantar forefoot  . History of colon polyps   . History of kidney stones   . Internal hemorrhoids   . Pneumonia    hx of   . PONV (postoperative nausea and vomiting)   . Seasonal allergies     Past Surgical History:  Procedure Laterality Date  . JOINT REPLACEMENT Left    knee  . KIDNEY STONE SURGERY     x3  . KNEE ARTHROSCOPY Bilateral   . MASS EXCISION Left 03/11/2018   Procedure: Excisional biopsy of left forefoot mass;  Surgeon: Wylene Simmer, MD;  Location: [place];  Service: Orthopedics;  Laterality: Left;  60 mins  . SHOULDER ARTHROSCOPY Right   . TOTAL SHOULDER ARTHROPLASTY Left 05/27/2018  . TOTAL SHOULDER ARTHROPLASTY Left 05/27/2018   Procedure: LEFT TOTAL SHOULDER ARTHROPLASTY;  Surgeon: Justice Britain, MD;  Location: [place];  Service: Orthopedics;  Laterality: Left;  175min    Family History  Problem Relation Age of Onset  . Parkinson's disease Mother   . Stroke Father   . Colon cancer Neg Hx   . Esophageal cancer Neg Hx   . Rectal cancer Neg Hx     Social History:  reports that he has never smoked. He has never used smokeless tobacco. He reports current alcohol use. He reports that he does not use drugs.   Medications Prior to Admission  Medication Sig Dispense Refill  . acetaminophen (TYLENOL) 650 MG CR tablet Take 650 mg by mouth daily.    . APPLE CIDER VINEGAR PO Take 1 each by mouth 2 (two) times daily.     . Carboxymethylcellul-Glycerin (REFRESH OPTIVE  OP) Place 1 drop into both eyes daily as needed (dry eyes).     . hydrocortisone (ANUSOL-HC) 2.5 % rectal cream APPLY RECTALLY TO THE AFFECTED AREA AT BEDTIME (Patient taking differently: Place 1 application rectally daily as needed for hemorrhoids. ) 30 g 1  . loratadine (CLARITIN) 10 MG tablet Take 10 mg by mouth daily.    . Multiple Vitamin (MULTIVITAMIN WITH MINERALS) TABS tablet Take 1 tablet by mouth daily.    Marland Kitchen OVER THE COUNTER MEDICATION Apply 1 application topically daily as needed (joint pain). cbd cream    . OVER THE COUNTER MEDICATION Take 0.5 capsules by mouth daily as needed (pain). cbd gummies    . Saw Palmetto, Serenoa repens, (SAW PALMETTO PO) Take 1 tablet by mouth daily.    Marland Kitchen sulfamethoxazole-trimethoprim (BACTRIM DS) 800-160 MG tablet Take 1 tablet by mouth See admin instructions. Take 1 tablet by mouth twice daily for 7 days as needed for uti symptoms    . traMADol (ULTRAM) 50 MG tablet Take 50 mg by mouth daily as needed for moderate pain.    Marland Kitchen XARELTO 10 MG TABS tablet Take 10 mg by mouth daily.        Physical Exam: Right shoulder demonstrates painful and restricted motion as noted at recent office visits  Vitals  Temp:  [88  F (36.7 C)] 98 F (36.7 C) (10/28 0550) Pulse Rate:  [71] 71 (10/28 0550) Resp:  [16] 16 (10/28 0550) BP: (140)/(88) 140/88 (10/28 0550) SpO2:  [97 %] 97 % (10/28 0550)  Assessment/Plan  Impression: Right shoulder osteoarthritis  Plan of Action: Procedure(s): TOTAL SHOULDER ARTHROPLASTY  Nicholas Terry M Nicholas Terry 05/17/2020, 6:48 AM Contact # [phone]

## 2020-05-17 NOTE — Op Note (Signed)
05/17/2020  10:18 AM  PATIENT:   Nicholas Terry  69 y.o. male  PRE-OPERATIVE DIAGNOSIS:  Right shoulder osteoarthritis  POST-OPERATIVE DIAGNOSIS: Same  PROCEDURE: Right anatomic total shoulder arthroplasty utilizing a cemented size large glenoid with a size 13 Arthrex universe 2 longstem with a 45 x 15 concentric humeral head  SURGEON:  Nyima Vanacker, Metta Clines M.D.  ASSISTANTS: Jenetta Loges, PA-C  ANESTHESIA:   General endotracheal and interscalene block with Exparel  EBL: 150 cc  SPECIMEN: None  Drains: None   PATIENT DISPOSITION:  PACU - hemodynamically stable.    PLAN OF CARE: Discharge to home after PACU  Brief history:  Nicholas Terry is a 69 year old gentleman who is well-known to our practice status post a previous left total shoulder arthroplasty.  He has known severe right shoulder osteoarthritis with increasing pain and functional mutations and he is brought to the operating room at this time for planned right total shoulder arthroplasty.  Preoperatively, I counseled the patient regarding treatment options and risks versus benefits thereof.  Possible surgical complications were all reviewed including potential for bleeding, infection, neurovascular injury, persistent pain, loss of motion, anesthetic complication, failure of the implant, and possible need for additional surgery. They understand and accept and agrees with our planned procedure.   Procedure in detail:  After undergoing routine preop evaluation the patient received prophylactic antibiotics and interscalene block with Exparel established in the holding area by the anesthesia department.  Patient was subsequently placed supine on the operating table and underwent the smooth induction of a general endotracheal anesthesia.  Placed into the beachchair position and appropriately padded and protected.  The right shoulder girdle region was sterilely prepped and draped in standard fashion.  Timeout was called.  An  anterior deltopectoral incision to the right shoulder is made through an approximately 10 cm incision.  Skin flaps were elevated dissection carried deeply electrocautery was used for hemostasis.  The deltopectoral interval was then developed from proximal to distal with the vein taken laterally in the upper centimeter the pectoralis major was divided for improved exposure and the conjoined tendon was mobilized and retracted medially.  Adhesions were divided from beneath the deltoid.  The long head biceps tendon was then tenodesed at the upper border of the pectoralis major tendon with proximal segment unroofed and excised.  The rotator cuff was then split along the rotator interval to the base of the coracoid and we identified the superior and inferior aspects of the subscapularis attachment into the lesser tuberosity an oscillating saw was then used to perform a lesser tuberosity osteotomy removing a thin wafer of bone proximally 2 to 3 mm in thickness.  The subscapularis was then mobilized tagged and reflected medially and the capsule attachments from the anterior inferior margins of the humeral neck were then divided in a subperiosteal fashion.  Humeral head was delivered through the wound and extra medullary guide was then used to outline our humeral head resection which was then performed with an oscillating saw at approximate 25 degrees of retroversion matching the native retroversion with care taken to protect the rotator cuff superiorly and posteriorly.  We then remove the very large osteophytes remaining on the humeral neck allowing [place] to identify the center of rotation of the humeral head.  We initially planned for size 45 trunnion anticipating use of a stemless implant.  This was appropriately marked and a metal cap was then placed over the cut proximal humeral surface.  This point with performed exposure of glenoid  with appropriate retractors in gaining complete visualization and performed a  circumferential labral resection.  A guidepin was then directed into the center of the glenoid and the glenoid was then prepared with our reamer followed by our central drill in the superior and inferior peg and slot respectively.  Glenoid was then broached and trial glenoid showed excellent fit and fixation.  The glenoid was then irrigated cleaned and dried.  Cement was then mixed and introduced into the superior and inferior peg and slot respectively.  The central peg of the glenoid was impacted with cancellous bone harvested from the resected humeral head.  The glenoid was then impacted with excellent fit and fixation.  We then returned attention back to the proximal humerus.  We selected the size 45 trunnion with a large cage screw.  The cage screw was then impacted with bone and was then seated.  As we were tightening the cage screw we did not achieve as solid fixation as I would have typically anticipated.  We did perform some trial reductions and have selected the humeral head but upon removal to try we found that the stemless implant had significantly loosened and did not have adequate fixation.  With this finding we initially elected to convert to the use of a short stem implant.  We went ahead and broached with a short stem implant ultimately up to a size 12 which is the size that we used on the opposite shoulder.  This initially showed what appeared to be good fixation.  We implanted the size 12 and unfortunately as we are performing trial reductions it became clear that the size 12 short stem also did not achieve adequate fixation and was readily loosened and could be withdrawn simply by manual grasping.  With this finding we had concerns regarding the quality of the bone in the metaphyseal region and as such we elected to convert to the use of a longstem implant the universe to implant system from Arth[employer].  We went ahead and broached for the system and ultimately selected the size 13.  The size 13 stem  was then impacted with approximately 25 degrees of retroversion.  We did pass sutures to the eyelets on the collar of the stem to allow for a repair construct for the lesser tuberosity.  We then performed trial reductions and ultimately felt that the 45 x 15 head gave [place] the best soft tissue balance good motion good stability.  The final size 15 head was then impacted onto the [facility] taper after it had been cleaned and dried.  I should mention that we did utilize bone graft harvested from the head to bone graft from the proximal aspect of our longstem prosthesis and also introduce vancomycin powder into the humeral canal and into the peritubular soft tissues.  Final reduction was then performed.  We are pleased with the translation allowing 50% motion with good tension on the soft tissues good motion and good stability and good soft tissue balance.  We then carefully mobilized the subscapularis.  The subscapularis was then repaired using our standard horizontal and crossing sutures that had been placed around the stem and through appropriate bone tunnels allowing excellent reapposition of the lesser tuberosity osteotomy to the donor site on the humeral metaphysis.  The rotator interval was closed with a series of figure-of-eight suture tape sutures.  Upon completion the arm easily achieved 45 degrees of external rotation without excessive tension on the subscap repair.  The wound was again irrigated.  Hemostasis was obtained.  We should mention that there was more noted be compromise of the cephalic vein and so it was ligated at its midpoint.  The deltopectoral interval was then reapproximated with a series of figure-of-eight and 1 Vicryl sutures.  2-0 Vicryl used in the subcu layer and intracuticular 3-0 Monocryl for the skin followed by Dermabond and Aquacel dressing.  Right arm was placed into a sling and the patient was awakened, extubated, and taken to the recovery room in stable condition.  Jenetta Loges,  PA-C was utilized as an Environmental consultant throughout this case, essential for help with positioning the patient, positioning extremity, tissue manipulation, implantation of the prosthesis, suture management, wound closure, and intraoperative decision-making.  Marin Shutter MD   Contact # [phone]

## 2020-05-17 NOTE — Discharge Instructions (Signed)
 Kevin M. Supple, M.D., F.A.A.O.S. Orthopaedic Surgery Specializing in Arthroscopic and Reconstructive Surgery of the Shoulder [phone] [address] - [place], [place] [address] - Fax [phone]   POST-OP TOTAL SHOULDER REPLACEMENT INSTRUCTIONS  1. Follow up in the office for your first post-op appointment 10-14 days from the date of your surgery. If you do not already have a scheduled appointment, our office will contact you to schedule.  2. The bandage over your incision is waterproof. You may begin showering with this dressing on. You may leave this dressing on until first follow up appointment within 2 weeks. We prefer you leave this dressing in place until follow up however after 5-7 days if you are having itching or skin irritation and would like to remove it you may do so. Go slow and tug at the borders gently to break the bond the dressing has with the skin. At this point if there is no drainage it is okay to go without a bandage or you may cover it with a light guaze and tape. You can also expect significant bruising around your shoulder that will drift down your arm and into your chest wall. This is very normal and should resolve over several days.   3. Wear your sling/immobilizer at all times except to perform the exercises below or to occasionally let your arm dangle by your side to stretch your elbow. You also need to sleep in your sling immobilizer until instructed otherwise. It is ok to remove your sling if you are sitting in a controlled environment and allow your arm to rest in a position of comfort by your side or on your lap with pillows to give your neck and skin a break from the sling. You may remove it to allow arm to dangle by side to shower. If you are up walking around and when you go to sleep at night you need to wear it.  4. Range of motion to your elbow, wrist, and hand are encouraged 3-5 times daily. Exercise to your hand and fingers helps to reduce  swelling you may experience.   5. Prescriptions for a pain medication and a muscle relaxant are provided for you. It is recommended that if you are experiencing pain that you pain medication alone is not controlling, add the muscle relaxant along with the pain medication which can give additional pain relief. The first 1-2 days is generally the most severe of your pain and then should gradually decrease. As your pain lessens it is recommended that you decrease your use of the pain medications to an "as needed basis'" only and to always comply with the recommended dosages of the pain medications.  6. Pain medications can produce constipation along with their use. If you experience this, the use of an over the counter stool softener or laxative daily is recommended.   7. For additional questions or concerns, please do not hesitate to call the office. If after hours there is an answering service to forward your concerns to the physician on call.  8.Pain control following an exparel block  To help control your post-operative pain you received a nerve block  performed with Exparel which is a long acting anesthetic (numbing agent) which can provide pain relief and sensations of numbness (and relief of pain) in the operative shoulder and arm for up to 3 days. Sometimes it provides mixed relief, meaning you may still have numbness in certain areas of the arm but can still be able to   move  parts of that arm, hand, and fingers. We recommend that your prescribed pain medications  be used as needed. We do not feel it is necessary to "pre medicate" and "stay ahead" of pain.  Taking narcotic pain medications when you are not having any pain can lead to unnecessary and potentially dangerous side effects.    9. Use the ice machine as much as possible in the first 5-7 days from surgery, then you can wean its use to as needed. The ice typically needs to be replaced every 6 hours, instead of ice you can actually freeze  water bottles to put in the cooler and then fill water around them to avoid having to purchase ice. You can have spare water bottles freezing to allow you to rotate them once they have melted. Try to have a thin shirt or light cloth or towel under the ice wrap to protect your skin.   10.  We recommend that you avoid any dental work or cleaning in the first 3 months following your joint replacement. This is to help minimize the possibility of infection from the bacteria in your mouth that enters your bloodstream during dental work. We also recommend that you take an antibiotic prior to your dental work for the first year after your shoulder replacement to further help reduce that risk. Please simply contact our office for antibiotics to be sent to your pharmacy prior to dental work.  11. Dental Antibiotics:  In most cases prophylactic antibiotics for Dental procdeures after total joint surgery are not necessary.  Exceptions are as follows:  1. History of prior total joint infection  2. Severely immunocompromised (Organ Transplant, cancer chemotherapy, Rheumatoid biologic meds such as Humera)  3. Poorly controlled diabetes (A1C &gt; 8.0, blood glucose over 200)  If you have one of these conditions, contact your surgeon for an antibiotic prescription, prior to your dental procedure.   POST-OP EXERCISES  Pendulum Exercises  Perform pendulum exercises while standing and bending at the waist. Support your uninvolved arm on a table or chair and allow your operated arm to hang freely. Make sure to do these exercises passively - not using you shoulder muscles. These exercises can be performed once your nerve block effects have worn off.  Repeat 20 times. Do 3 sessions per day.     

## 2020-05-17 NOTE — Anesthesia Postprocedure Evaluation (Signed)
Anesthesia Post Note  [name]  Procedure(s) Performed: TOTAL SHOULDER ARTHROPLASTY (Right Shoulder)     Patient location during evaluation: PACU Anesthesia Type: General and Regional Level of consciousness: awake and alert Pain management: pain level controlled Vital Signs Assessment: post-procedure vital signs reviewed and stable Respiratory status: spontaneous breathing, nonlabored ventilation, respiratory function stable and patient connected to nasal cannula oxygen Cardiovascular status: blood pressure returned to baseline and stable Postop Assessment: no apparent nausea or vomiting Anesthetic complications: no   No complications documented.  Last Vitals:  Vitals:   05/17/20 1045 05/17/20 1100  BP: 125/69 130/82  Pulse: 84 78  Resp: 18 18  Temp:  36.9 C  SpO2: 99% 95%    Last Pain:  Vitals:   05/17/20 1100  TempSrc:   PainSc: 0-No pain                 Barnet Glasgow

## 2020-05-17 NOTE — Anesthesia Procedure Notes (Signed)
Anesthesia Regional Block: Interscalene brachial plexus block   Pre-Anesthetic Checklist: ,, timeout performed, Correct Patient, Correct Site, Correct Laterality, Correct Procedure, Correct Position, site marked, Risks and benefits discussed,  Surgical consent,  Pre-op evaluation,  At surgeon's request and post-op pain management  Laterality: Upper  Prep: Maximum Sterile Barrier Precautions used, chloraprep       Needles:  Injection technique: Single-shot  Needle Type: Echogenic Needle     Needle Length: 5cm  Needle Gauge: 21     Additional Needles:   Procedures:,,,, ultrasound used (permanent image in chart),,,,  Narrative:  Start time: 05/17/2020 6:53 AM End time: 05/17/2020 7:00 AM Injection made incrementally with aspirations every 5 mL.  Performed by: Personally  Anesthesiologist: Barnet Glasgow, MD  Additional Notes: Block assessed prior to procedure. Patient tolerated procedure well.

## 2020-05-17 NOTE — Anesthesia Procedure Notes (Signed)
Procedure Name: Intubation Date/Time: 05/17/2020 7:40 AM Performed by: Gerald Leitz, CRNA Pre-anesthesia Checklist: Patient identified, Patient being monitored, Timeout performed, Emergency Drugs available and Suction available Patient Re-evaluated:Patient Re-evaluated prior to induction Oxygen Delivery Method: Circle system utilized Preoxygenation: Pre-oxygenation with 100% oxygen Induction Type: IV induction Ventilation: Mask ventilation without difficulty Laryngoscope Size: Mac and 3 Grade View: Grade I Tube type: Oral Tube size: 7.5 mm Number of attempts: 1 Placement Confirmation: ETT inserted through vocal cords under direct vision,  positive ETCO2 and breath sounds checked- equal and bilateral Secured at: 21 cm Tube secured with: Tape Dental Injury: Teeth and Oropharynx as per pre-operative assessment

## 2020-05-17 NOTE — Evaluation (Signed)
Occupational Therapy Evaluation Patient Details Name: Nicholas Terry MRN: [id number] DOB: 05/12/1951 Today's Date: 05/17/2020    History of Present Illness Patient is a 69 year old male admitted 10/28 for R total shoulder arthroplasty. PMH includes DVT, PNA, L TKA   Clinical Impression   s/p shoulder replacement without functional use of right dominant upper extremity secondary to effects of surgery and interscalene block and shoulder precautions. Therapist provided education and instruction to patient and spouse in regards to exercises, precautions, positioning, donning upper extremity clothing and bathing while maintaining shoulder precautions, ice and edema management and donning/doffing sling. Patient and spouse verbalized understanding and demonstrated as needed. Patient needed assistance to donn shirt, underwear, pants, socks and shoes and provided with instruction on compensatory strategies to perform ADLs. Patient to follow up with MD for further therapy needs.      Follow Up Recommendations  Follow surgeon's recommendation for DC plan and follow-up therapies    Equipment Recommendations  None recommended by OT       Precautions / Restrictions Precautions Precautions: Shoulder Type of Shoulder Precautions: May come out of sling if sitting in controlled environment. ie while watching tv, eating etc to give neck and skin break from sling. Please sleep in sling though until 4 weeks post op.  Ok to use operative arm to assist in feeding, bathing, ADL's. New PROM restrictions (8/18) for use in hygiene and ADL only ER 20 ABD 45 FF 60. Pendulums are to be gentle and are the preferred exercise to be instructed for patients to perform at home.  Shoulder Interventions: Shoulder sling/immobilizer;Off for dressing/bathing/exercises Precaution Booklet Issued: Yes (comment) Required Braces or Orthoses: Sling Restrictions Weight Bearing Restrictions: Yes RUE Weight Bearing: Non weight bearing       Mobility Bed Mobility               General bed mobility comments: in chair    Transfers Overall transfer level: Needs assistance Equipment used: None Transfers: Sit to/from Stand Sit to Stand: Supervision         General transfer comment: S for safety    Balance Overall balance assessment: Mild deficits observed, not formally tested                                         ADL either performed or assessed with clinical judgement   ADL Overall ADL's : Needs assistance/impaired     Grooming: Set up;Sitting   Upper Body Bathing: Minimal assistance;Sitting   Lower Body Bathing: Moderate assistance;Sitting/lateral leans;Sit to/from stand   Upper Body Dressing : Maximal assistance;Standing;Cueing for sequencing;Cueing for compensatory techniques   Lower Body Dressing: Maximal assistance;Sitting/lateral leans;Sit to/from stand Lower Body Dressing Details (indicate cue type and reason): to initiating threading clothing and pulling up to hip level Toilet Transfer: Supervision/safety;Ambulation   Toileting- Clothing Manipulation and Hygiene: Minimal assistance;Sit to/from stand       Functional mobility during ADLs: Supervision/safety General ADL Comments: educate patient and spouse on compensatory strategies for self care tasks while maintaining shoulder precautions                  Pertinent Vitals/Pain Pain Assessment: Faces Faces Pain Scale: Hurts a little bit Pain Location: R shoulder Pain Descriptors / Indicators: Numbness     Hand Dominance Right   Extremity/Trunk Assessment Upper Extremity Assessment Upper Extremity Assessment: RUE deficits/detail RUE Deficits / Details: +  nerve block   Lower Extremity Assessment Lower Extremity Assessment: Overall WFL for tasks assessed   Cervical / Trunk Assessment Cervical / Trunk Assessment: Normal   Communication Communication Communication: No difficulties   Cognition  Arousal/Alertness: Awake/alert Behavior During Therapy: WFL for tasks assessed/performed Overall Cognitive Status: Within Functional Limits for tasks assessed                                        Exercises Exercises: Shoulder   Shoulder Instructions Shoulder Instructions Donning/doffing shirt without moving shoulder: Maximal assistance Method for sponge bathing under operated UE: Patient able to independently direct caregiver;Caregiver independent with task Donning/doffing sling/immobilizer: Caregiver independent with task;Patient able to independently direct caregiver Correct positioning of sling/immobilizer: Patient able to independently direct caregiver;Caregiver independent with task Pendulum exercises (written home exercise program): Patient able to independently direct caregiver;Caregiver independent with task ROM for elbow, wrist and digits of operated UE: Patient able to independently direct caregiver;Caregiver independent with task Sling wearing schedule (on at all times/off for ADL's): Caregiver independent with task;Patient able to independently direct caregiver Proper positioning of operated UE when showering: Patient able to independently direct caregiver;Caregiver independent with task Positioning of UE while sleeping: Patient able to independently direct caregiver;Caregiver independent with task    Home Living Family/patient expects to be discharged to:: Private residence Living Arrangements: Spouse/significant other Available Help at Discharge: Family Type of Home: House Home Access: Stairs to enter Technical brewer of Steps: 3   Home Layout: One level     Bathroom Shower/Tub: Occupational psychologist: Handicapped height     Home Equipment: Grab bars - tub/shower;Shower seat - built in          Prior Functioning/Environment Level of Independence: Independent                 OT Problem List: Pain;Impaired UE functional  use         OT Goals(Current goals can be found in the care plan section) Acute Rehab OT Goals Patient Stated Goal: go home OT Goal Formulation: With patient   AM-PAC OT "6 Clicks" Daily Activity     Outcome Measure Help from another person eating meals?: None Help from another person taking care of personal grooming?: A Little Help from another person toileting, which includes using toliet, bedpan, or urinal?: A Little Help from another person bathing (including washing, rinsing, drying)?: A Lot Help from another person to put on and taking off regular upper body clothing?: A Lot Help from another person to put on and taking off regular lower body clothing?: A Lot 6 Click Score: 16   End of Session Equipment Utilized During Treatment: Other (comment) (sling) Nurse Communication: Mobility status  Activity Tolerance: Patient tolerated treatment well Patient left: in chair;with family/visitor present  OT Visit Diagnosis: Pain Pain - Right/Left: Right Pain - part of body: Shoulder                Time: 1300-1326 OT Time Calculation (min): 26 min Charges:  OT General Charges $OT Visit: 1 Visit OT Evaluation $OT Eval Low Complexity: 1 Low OT Treatments $Self Care/Home Management : 8-22 mins  Delbert Phenix OT OT pager: [phone]  Rosemary Holms 05/17/2020, 2:02 PM

## 2020-05-17 NOTE — Transfer of Care (Signed)
Immediate Anesthesia Transfer of Care Note  [name]  Procedure(s) Performed: Procedure(s) with comments: TOTAL SHOULDER ARTHROPLASTY (Right) - 131min  Patient Location: PACU  Anesthesia Type:General  Level of Consciousness: Alert, Awake, Oriented  Airway & Oxygen Therapy: Patient Spontanous Breathing  Post-op Assessment: Report given to RN  Post vital signs: Reviewed and stable  Last Vitals:  Vitals:   05/17/20 0550  BP: 140/88  Pulse: 71  Resp: 16  Temp: 36.7 C  SpO2: 03%    Complications: No apparent anesthesia complications

## 2020-05-21 ENCOUNTER — Encounter (HOSPITAL_COMMUNITY): Payer: Self-pay | Admitting: Orthopedic Surgery

## 2020-05-22 ENCOUNTER — Ambulatory Visit: Payer: Medicare PPO | Admitting: Physical Therapy

## 2020-05-24 ENCOUNTER — Ambulatory Visit: Payer: Medicare PPO | Admitting: Physical Therapy

## 2020-05-29 ENCOUNTER — Ambulatory Visit: Payer: Medicare PPO | Admitting: Physical Therapy

## 2020-05-30 DIAGNOSIS — Z96611 Presence of right artificial shoulder joint: Secondary | ICD-10-CM | POA: Diagnosis not present

## 2020-05-30 DIAGNOSIS — Z471 Aftercare following joint replacement surgery: Secondary | ICD-10-CM | POA: Diagnosis not present

## 2020-05-30 DIAGNOSIS — Z96612 Presence of left artificial shoulder joint: Secondary | ICD-10-CM | POA: Diagnosis not present

## 2020-05-31 ENCOUNTER — Ambulatory Visit: Payer: Medicare PPO | Admitting: Physical Therapy

## 2020-06-01 ENCOUNTER — Encounter: Payer: Self-pay | Admitting: Physical Therapy

## 2020-06-01 ENCOUNTER — Ambulatory Visit: Payer: Medicare PPO | Attending: Orthopedic Surgery | Admitting: Physical Therapy

## 2020-06-01 ENCOUNTER — Other Ambulatory Visit: Payer: Self-pay

## 2020-06-01 DIAGNOSIS — M25611 Stiffness of right shoulder, not elsewhere classified: Secondary | ICD-10-CM | POA: Insufficient documentation

## 2020-06-01 DIAGNOSIS — M25511 Pain in right shoulder: Secondary | ICD-10-CM | POA: Diagnosis not present

## 2020-06-01 DIAGNOSIS — M25612 Stiffness of left shoulder, not elsewhere classified: Secondary | ICD-10-CM | POA: Insufficient documentation

## 2020-06-01 DIAGNOSIS — R600 Localized edema: Secondary | ICD-10-CM

## 2020-06-01 NOTE — Therapy (Signed)
[place]. [place], [place], [address] Phone: [phone]   Fax:  [phone]  Physical Therapy Evaluation  Patient Details  Name: Nicholas Terry MRN: [id number] Date of Birth: August 21, 1950 Referring Provider (PT): Supple   Encounter Date: 06/01/2020   PT End of Session - 06/01/20 0907    Visit Number 1    Number of Visits 12    Date for PT Re-Evaluation 07/15/20    Authorization Type [employer]    PT Start Time 0830    PT Stop Time 0926    PT Time Calculation (min) 56 min    Activity Tolerance Patient tolerated treatment well    Behavior During Therapy [facility] for tasks assessed/performed           Past Medical History:  Diagnosis Date  . Arthritis   . BPH (benign prostatic hyperplasia)   . Complication of anesthesia   . DVT (deep venous thrombosis) (HCC)    bil calf  . ED (erectile dysfunction)   . Foot mass, left    left plantar forefoot  . History of colon polyps   . History of kidney stones   . Internal hemorrhoids   . Pneumonia    hx of   . PONV (postoperative nausea and vomiting)   . Seasonal allergies     Past Surgical History:  Procedure Laterality Date  . JOINT REPLACEMENT Left    knee  . KIDNEY STONE SURGERY     x3  . KNEE ARTHROSCOPY Bilateral   . MASS EXCISION Left 03/11/2018   Procedure: Excisional biopsy of left forefoot mass;  Surgeon: Wylene Simmer, MD;  Location: [place];  Service: Orthopedics;  Laterality: Left;  60 mins  . SHOULDER ARTHROSCOPY Right   . TOTAL SHOULDER ARTHROPLASTY Left 05/27/2018  . TOTAL SHOULDER ARTHROPLASTY Left 05/27/2018   Procedure: LEFT TOTAL SHOULDER ARTHROPLASTY;  Surgeon: Justice Britain, MD;  Location: [place];  Service: Orthopedics;  Laterality: Left;  172min  . TOTAL SHOULDER ARTHROPLASTY Right 05/17/2020   Procedure: TOTAL SHOULDER ARTHROPLASTY;  Surgeon: Justice Britain, MD;  [facility];  Service: Orthopedics;  Laterality: Right;   178min    There were no vitals filed for this visit.    Subjective Assessment - 06/01/20 0833    Subjective Patient reports that he had s right shoulder scope about 18 years ago, reports that over time he started having more and more shoulder pain.and some difficulty with ROM, had left TSA 2019.  He underwent a right TSA on 05/17/20.  Is in sling and has been doing pendulum exercises    Limitations Lifting;House hold activities    Patient Stated Goals have normal ROM and no pain, be stronger    Currently in Pain? Yes    Pain Score 1     Pain Location Shoulder    Pain Orientation Right    Pain Descriptors / Indicators Aching    Pain Type Acute pain;Surgical pain    Aggravating Factors  movements will increase the pain    Pain Relieving Factors taking Tylenol only    Effect of Pain on Daily Activities difficulty with all ADL's, he is right handed              [facility] PT Assessment - 06/01/20 0001      Assessment   Medical Diagnosis s/p right TSA    Referring Provider (PT) Supple    Onset Date/Surgical Date 05/17/20    Hand Dominance  Right      Precautions   Precaution Comments follow shoulder protocol      Balance Screen   Has the patient fallen in the past 6 months No    Has the patient had a decrease in activity level because of a fear of falling?  No    Is the patient reluctant to leave their home because of a fear of falling?  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 Comments guarded posture, slightly elevated shoulder      ROM / Strength   AROM / PROM / Strength PROM      PROM   PROM Assessment Site Shoulder;Elbow    Right/Left Shoulder Right    Right Shoulder Flexion 88 Degrees    Right Shoulder ABduction 60 Degrees   tight   Right  Shoulder Internal Rotation 45 Degrees   50 degrees    Right Shoulder External Rotation 0 Degrees   very tight with pain   Right/Left Elbow Right    Right Elbow Flexion 120    Right Elbow Extension 20      Palpation   Palpation comment scar has slight puckering, has some tightness in teh pectoral area, has some tightness in the upper trap and the bicep area                      Objective measurements completed on examination: See above findings.       [facility] Adult PT Treatment/Exercise - 06/01/20 0001      Modalities   Modalities Vasopneumatic      Vasopneumatic   Number Minutes Vasopneumatic  15 minutes    Vasopnuematic Location  Shoulder    Vasopneumatic Pressure Medium    Vasopneumatic Temperature  32                    PT Short Term Goals - 06/01/20 0913      PT SHORT TERM GOAL #1   Title independent with initial HEP    Time 2    Period Weeks    Status New             PT Long Term Goals - 06/01/20 0913      PT LONG TERM GOAL #1   Title understand and do RICE at home    Time 12    Period Weeks    Status New      PT LONG TERM GOAL #2   Title dress without difficulty    Time 12    Period Weeks    Status New      PT LONG TERM GOAL #3   Title increase AROM to 130 degrees flexion    Time 12    Period Weeks    Status New      PT LONG TERM GOAL #4   Title reach into cabinet with 3#    Time 12    Period Weeks    Status New      PT LONG TERM GOAL #5   Title increase AROM of ER to 65 degrees    Time 12    Period Weeks    Status New  Plan - 06/01/20 0908    Clinical Impression Statement Patient underwent a right TSA on 05/17/20.  He reports doing well, only taking Tylenol.  He is in sling, he presents with right shoulder elevated and is guarded with his motions.  Scar and pectoral is tight.  ROM is very tight into ER and abduction.  He can get to the protocol limits fpor Abduction and flexion.     Stability/Clinical Decision Making Evolving/Moderate complexity    Clinical Decision Making Low    Rehab Potential Good    PT Frequency 2x / week    PT Duration 6 weeks    PT Treatment/Interventions ADLs/Self Care Home Management;Cryotherapy;Electrical Stimulation;Neuromuscular re-education;Therapeutic exercise;Therapeutic activities;Patient/family education;Manual techniques;Vasopneumatic Device    PT Next Visit Plan follow protocol    Consulted and Agree with Plan of Care Patient           Patient will benefit from skilled therapeutic intervention in order to improve the following deficits and impairments:  Decreased range of motion, Increased muscle spasms, Impaired UE functional use, Postural dysfunction, Increased edema, Decreased strength, Impaired flexibility, Pain  Visit Diagnosis: Acute pain of right shoulder - Plan: PT plan of care cert/re-cert  Stiffness of right shoulder, not elsewhere classified - Plan: PT plan of care cert/re-cert  Localized edema - Plan: PT plan of care cert/re-cert     Problem List Patient Active Problem List   Diagnosis Date Noted  . 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 [place], [place], [address] Phone: [phone]   Fax:  [phone]  Name: KIYOSHI SCHAAB MRN: [id number] Date of Birth: 05/08/1951

## 2020-06-01 NOTE — Patient Instructions (Signed)
Access Code: [email or web address] URL: [email or web address] Date: 06/01/2020 Prepared by: Lum Babe  Exercises Seated Shoulder Flexion Towel Slide at Table Top Full Range of Motion - 1 x daily - 7 x weekly - 3 sets - 10 reps - 30 hold Seated Shoulder External Rotation PROM on Table - 1 x daily - 7 x weekly - 3 sets - 10 reps - 30 hold Seated Shoulder Shrugs - 1 x daily - 7 x weekly - 3 sets - 10 reps - 30 hold Seated Scapular Retraction - 1 x daily - 7 x weekly - 3 sets - 10 reps - 30 hold

## 2020-06-05 ENCOUNTER — Encounter: Payer: Self-pay | Admitting: Physical Therapy

## 2020-06-05 ENCOUNTER — Other Ambulatory Visit: Payer: Self-pay

## 2020-06-05 ENCOUNTER — Ambulatory Visit: Payer: Medicare PPO | Admitting: Physical Therapy

## 2020-06-05 DIAGNOSIS — M25612 Stiffness of left shoulder, not elsewhere classified: Secondary | ICD-10-CM | POA: Diagnosis not present

## 2020-06-05 DIAGNOSIS — M25511 Pain in right shoulder: Secondary | ICD-10-CM | POA: Diagnosis not present

## 2020-06-05 DIAGNOSIS — R600 Localized edema: Secondary | ICD-10-CM | POA: Diagnosis not present

## 2020-06-05 DIAGNOSIS — M25611 Stiffness of right shoulder, not elsewhere classified: Secondary | ICD-10-CM

## 2020-06-05 NOTE — Therapy (Signed)
[place]. [place], [place], [address] Phone: [phone]   Fax:  [phone]  Physical Therapy Treatment  Patient Details  Name: Nicholas Terry MRN: [id number] Date of Birth: 04-28-1951 Referring Provider (PT): Supple   Encounter Date: 06/05/2020   PT End of Session - 06/05/20 1056    Visit Number 2    Date for PT Re-Evaluation 07/15/20    Authorization Type [employer]    PT Start Time 1015    PT Stop Time 1106    PT Time Calculation (min) 51 min    Activity Tolerance Patient tolerated treatment well    Behavior During Therapy [facility] for tasks assessed/performed           Past Medical History:  Diagnosis Date  . Arthritis   . BPH (benign prostatic hyperplasia)   . Complication of anesthesia   . DVT (deep venous thrombosis) (HCC)    bil calf  . ED (erectile dysfunction)   . Foot mass, left    left plantar forefoot  . History of colon polyps   . History of kidney stones   . Internal hemorrhoids   . Pneumonia    hx of   . PONV (postoperative nausea and vomiting)   . Seasonal allergies     Past Surgical History:  Procedure Laterality Date  . JOINT REPLACEMENT Left    knee  . KIDNEY STONE SURGERY     x3  . KNEE ARTHROSCOPY Bilateral   . MASS EXCISION Left 03/11/2018   Procedure: Excisional biopsy of left forefoot mass;  Surgeon: Wylene Simmer, MD;  Location: [place];  Service: Orthopedics;  Laterality: Left;  60 mins  . SHOULDER ARTHROSCOPY Right   . TOTAL SHOULDER ARTHROPLASTY Left 05/27/2018  . TOTAL SHOULDER ARTHROPLASTY Left 05/27/2018   Procedure: LEFT TOTAL SHOULDER ARTHROPLASTY;  Surgeon: Justice Britain, MD;  Location: [place];  Service: Orthopedics;  Laterality: Left;  141min  . TOTAL SHOULDER ARTHROPLASTY Right 05/17/2020   Procedure: TOTAL SHOULDER ARTHROPLASTY;  Surgeon: Justice Britain, MD;  [facility];  Service: Orthopedics;  Laterality: Right;  155min    There were  no vitals filed for this visit.   Subjective Assessment - 06/05/20 1018    Subjective "Feeling good today"    Currently in Pain? No/denies                             [facility] Adult PT Treatment/Exercise - 06/05/20 0001      Exercises   Exercises Shoulder      Shoulder Exercises: Standing   Other Standing Exercises Ball on table Flex,CW/CCW    Other Standing Exercises ball squeezes, Bicep curls 15 ress      Shoulder Exercises: Pulleys   Flexion 2 minutes    Other Pulley Exercises scaption 2 min       Modalities   Modalities Cryotherapy      Cryotherapy   Number Minutes Cryotherapy 10 Minutes    Cryotherapy Location Shoulder    Type of Cryotherapy Ice pack      Manual Therapy   Manual Therapy Passive ROM;Manual Traction    Passive ROM RUE in all directions within protocol    Manual Traction gentel distraction                    PT Short Term Goals - 06/01/20 0913      PT SHORT TERM  GOAL #1   Title independent with initial HEP    Time 2    Period Weeks    Status New             PT Long Term Goals - 06/01/20 0913      PT LONG TERM GOAL #1   Title understand and do RICE at home    Time 12    Period Weeks    Status New      PT LONG TERM GOAL #2   Title dress without difficulty    Time 12    Period Weeks    Status New      PT LONG TERM GOAL #3   Title increase AROM to 130 degrees flexion    Time 12    Period Weeks    Status New      PT LONG TERM GOAL #4   Title reach into cabinet with 3#    Time 12    Period Weeks    Status New      PT LONG TERM GOAL #5   Title increase AROM of ER to 65 degrees    Time 12    Period Weeks    Status New                 Plan - 06/05/20 1057    Clinical Impression Statement Pt tolerated an initial to TE fair. He did report some pain on the pulley at end range. Reports that he could feel it in his anterior shoulder with ball squeezes. All AROM within the limitations of protocol.  He does well with flexion and abduction but is really tight with R shoulder ER.    Stability/Clinical Decision Making Evolving/Moderate complexity    Rehab Potential Good    PT Frequency 2x / week    PT Duration 6 weeks    PT Treatment/Interventions ADLs/Self Care Home Management;Cryotherapy;Electrical Stimulation;Neuromuscular re-education;Therapeutic exercise;Therapeutic activities;Patient/family education;Manual techniques;Vasopneumatic Device    PT Next Visit Plan follow protocol           Patient will benefit from skilled therapeutic intervention in order to improve the following deficits and impairments:  Decreased range of motion, Increased muscle spasms, Impaired UE functional use, Postural dysfunction, Increased edema, Decreased strength, Impaired flexibility, Pain  Visit Diagnosis: Acute pain of right shoulder  Stiffness of right shoulder, not elsewhere classified  Localized edema     Problem List Patient Active Problem List   Diagnosis Date Noted  . 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 [place], [place], [address] Phone: [phone]   Fax:  [phone]  Name: Nicholas Terry MRN: [id number] Date of Birth: [birth year]

## 2020-06-07 ENCOUNTER — Encounter: Payer: Self-pay | Admitting: Physical Therapy

## 2020-06-07 ENCOUNTER — Other Ambulatory Visit: Payer: Self-pay

## 2020-06-07 ENCOUNTER — Ambulatory Visit: Payer: Medicare PPO | Admitting: Physical Therapy

## 2020-06-07 DIAGNOSIS — M25511 Pain in right shoulder: Secondary | ICD-10-CM

## 2020-06-07 DIAGNOSIS — M25611 Stiffness of right shoulder, not elsewhere classified: Secondary | ICD-10-CM | POA: Diagnosis not present

## 2020-06-07 DIAGNOSIS — R600 Localized edema: Secondary | ICD-10-CM | POA: Diagnosis not present

## 2020-06-07 DIAGNOSIS — M25612 Stiffness of left shoulder, not elsewhere classified: Secondary | ICD-10-CM | POA: Diagnosis not present

## 2020-06-07 NOTE — Therapy (Signed)
[place]. [place], [place], [address] Phone: [phone]   Fax:  [phone]  Physical Therapy Treatment  Patient Details  Name: Nicholas Terry MRN: [id number] Date of Birth: 15-Jun-1951 Referring Provider (PT): Supple   Encounter Date: 06/07/2020   PT End of Session - 06/07/20 1054    Visit Number 3    Date for PT Re-Evaluation 07/15/20    Authorization Type [employer]    PT Start Time 1015    PT Stop Time 1105    PT Time Calculation (min) 50 min    Activity Tolerance Patient tolerated treatment well    Behavior During Therapy [facility] for tasks assessed/performed           Past Medical History:  Diagnosis Date  . Arthritis   . BPH (benign prostatic hyperplasia)   . Complication of anesthesia   . DVT (deep venous thrombosis) (HCC)    bil calf  . ED (erectile dysfunction)   . Foot mass, left    left plantar forefoot  . History of colon polyps   . History of kidney stones   . Internal hemorrhoids   . Pneumonia    hx of   . PONV (postoperative nausea and vomiting)   . Seasonal allergies     Past Surgical History:  Procedure Laterality Date  . JOINT REPLACEMENT Left    knee  . KIDNEY STONE SURGERY     x3  . KNEE ARTHROSCOPY Bilateral   . MASS EXCISION Left 03/11/2018   Procedure: Excisional biopsy of left forefoot mass;  Surgeon: Wylene Simmer, MD;  Location: [place];  Service: Orthopedics;  Laterality: Left;  60 mins  . SHOULDER ARTHROSCOPY Right   . TOTAL SHOULDER ARTHROPLASTY Left 05/27/2018  . TOTAL SHOULDER ARTHROPLASTY Left 05/27/2018   Procedure: LEFT TOTAL SHOULDER ARTHROPLASTY;  Surgeon: Justice Britain, MD;  Location: [place];  Service: Orthopedics;  Laterality: Left;  158min  . TOTAL SHOULDER ARTHROPLASTY Right 05/17/2020   Procedure: TOTAL SHOULDER ARTHROPLASTY;  Surgeon: Justice Britain, MD;  [facility];  Service: Orthopedics;  Laterality: Right;  12min    There were  no vitals filed for this visit.   Subjective Assessment - 06/07/20 1018    Subjective "Pretty good, was sore in the middle of the night last night"    Currently in Pain? No/denies                             [facility] Adult PT Treatment/Exercise - 06/07/20 0001      Shoulder Exercises: Standing   Other Standing Exercises Ball on table Flex,CW/CCW    Other Standing Exercises ball squeezes, Bicep curls 15 rep      Shoulder Exercises: Pulleys   Flexion 2 minutes    Other Pulley Exercises scaption 2 min     Other Pulley Exercises IR behind back       Modalities   Modalities Vasopneumatic      Vasopneumatic   Number Minutes Vasopneumatic  10 minutes    Vasopnuematic Location  Shoulder    Vasopneumatic Pressure Medium    Vasopneumatic Temperature  32      Manual Therapy   Manual Therapy Passive ROM;Manual Traction    Passive ROM RUE in all directions within protocol    Manual Traction gentel distraction  PT Short Term Goals - 06/07/20 1059      PT SHORT TERM GOAL #1   Title independent with initial HEP    Status Achieved             PT Long Term Goals - 06/07/20 1059      PT LONG TERM GOAL #1   Title understand and do RICE at home    Status Achieved                 Plan - 06/07/20 1055    Clinical Impression Statement Pt did well today with PROM activities. He continues to have pain at end range with pulley and PROM. R shoulder ER is really limited with passive motions    Stability/Clinical Decision Making Evolving/Moderate complexity    Rehab Potential Good    PT Frequency 2x / week    PT Duration 6 weeks    PT Treatment/Interventions ADLs/Self Care Home Management;Cryotherapy;Electrical Stimulation;Neuromuscular re-education;Therapeutic exercise;Therapeutic activities;Patient/family education;Manual techniques;Vasopneumatic Device    PT Next Visit Plan follow protocol           Patient will benefit from  skilled therapeutic intervention in order to improve the following deficits and impairments:  Decreased range of motion, Increased muscle spasms, Impaired UE functional use, Postural dysfunction, Increased edema, Decreased strength, Impaired flexibility, Pain  Visit Diagnosis: Acute pain of right shoulder  Stiffness of right shoulder, not elsewhere classified  Localized edema     Problem List Patient Active Problem List   Diagnosis Date Noted  . 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 [place], [place], [address] Phone: [phone]   Fax:  [phone]  Name: Nicholas Terry MRN: [id number] Date of Birth: 1950/12/15

## 2020-06-12 ENCOUNTER — Other Ambulatory Visit: Payer: Self-pay

## 2020-06-12 ENCOUNTER — Encounter: Payer: Self-pay | Admitting: Physical Therapy

## 2020-06-12 ENCOUNTER — Ambulatory Visit: Payer: Medicare PPO | Admitting: Physical Therapy

## 2020-06-12 DIAGNOSIS — R600 Localized edema: Secondary | ICD-10-CM

## 2020-06-12 DIAGNOSIS — M25612 Stiffness of left shoulder, not elsewhere classified: Secondary | ICD-10-CM | POA: Diagnosis not present

## 2020-06-12 DIAGNOSIS — M25511 Pain in right shoulder: Secondary | ICD-10-CM

## 2020-06-12 DIAGNOSIS — M25611 Stiffness of right shoulder, not elsewhere classified: Secondary | ICD-10-CM

## 2020-06-12 NOTE — Therapy (Signed)
Nicholas Terry, [place], [address] Phone: [phone]   Fax:  [phone]  Physical Therapy Treatment  Patient Details  Name: Nicholas Terry MRN: [id number] Date of Birth: [birth year] Referring Provider (PT): Supple   Encounter Date: 06/12/2020   PT End of Session - 06/12/20 1054    Visit Number 4    Date for PT Re-Evaluation 07/15/20    Authorization Type [employer]    PT Start Time 1015    PT Stop Time 1106    PT Time Calculation (min) 51 min    Activity Tolerance Patient tolerated treatment well    Behavior During Therapy [facility] for tasks assessed/performed           Past Medical History:  Diagnosis Date  . Arthritis   . BPH (benign prostatic hyperplasia)   . Complication of anesthesia   . DVT (deep venous thrombosis) (HCC)    bil calf  . ED (erectile dysfunction)   . Foot mass, left    left plantar forefoot  . History of colon polyps   . History of kidney stones   . Internal hemorrhoids   . Pneumonia    hx of   . PONV (postoperative nausea and vomiting)   . Seasonal allergies     Past Surgical History:  Procedure Laterality Date  . JOINT REPLACEMENT Left    knee  . KIDNEY STONE SURGERY     x3  . KNEE ARTHROSCOPY Bilateral   . MASS EXCISION Left 03/11/2018   Procedure: Excisional biopsy of left forefoot mass;  Surgeon: Wylene Simmer, MD;  Location: [place];  Service: Orthopedics;  Laterality: Left;  60 mins  . SHOULDER ARTHROSCOPY Right   . TOTAL SHOULDER ARTHROPLASTY Left 05/27/2018  . TOTAL SHOULDER ARTHROPLASTY Left 05/27/2018   Procedure: LEFT TOTAL SHOULDER ARTHROPLASTY;  Surgeon: Justice Britain, MD;  Location: [place];  Service: Orthopedics;  Laterality: Left;  174min  . TOTAL SHOULDER ARTHROPLASTY Right 05/17/2020   Procedure: TOTAL SHOULDER ARTHROPLASTY;  Surgeon: Justice Britain, MD;  [facility];  Service: Orthopedics;  Laterality: Right;  160min    There were  no vitals filed for this visit.   Subjective Assessment - 06/12/20 1021    Subjective A little sore today, tried cutting his toenails reaching with RUE.    Patient Stated Goals have normal ROM and no pain, be stronger    Currently in Pain? Yes    Pain Score 1     Pain Location Shoulder                             [facility] Adult PT Treatment/Exercise - 06/12/20 0001      Shoulder Exercises: Standing   Other Standing Exercises Ball on table Flex,CW/CCW      Shoulder Exercises: Pulleys   Flexion 2 minutes    Other Pulley Exercises scaption 2 min     Other Pulley Exercises IR behind back       Shoulder Exercises: Isometric Strengthening   Flexion --   10x3''   Extension --   10x3''   External Rotation --   10x3''   Internal Rotation --   10x3''     Modalities   Modalities Vasopneumatic      Vasopneumatic   Number Minutes Vasopneumatic  10 minutes    Vasopnuematic Location  Shoulder    Vasopneumatic Pressure Medium    Vasopneumatic Temperature  32      Manual Therapy   Manual Therapy Passive ROM;Manual Traction    Passive ROM RUE in all directions within protocol    Manual Traction gentel distraction                    PT Short Term Goals - 06/07/20 1059      PT SHORT TERM GOAL #1   Title independent with initial HEP    Status Achieved             PT Long Term Goals - 06/07/20 1059      PT LONG TERM GOAL #1   Title understand and do RICE at home    Status Achieved                 Plan - 06/12/20 1055    Clinical Impression Statement Pt continues to have pain at end range. Progressed to some isometric exercises, he did reports some pain with abduction. Passive ER remains most limited.    Stability/Clinical Decision Making Evolving/Moderate complexity    Rehab Potential Good    PT Frequency 2x / week    PT Duration 6 weeks    PT Treatment/Interventions ADLs/Self Care Home Management;Cryotherapy;Electrical  Stimulation;Neuromuscular re-education;Therapeutic exercise;Therapeutic activities;Patient/family education;Manual techniques;Vasopneumatic Device    PT Next Visit Plan follow protocol           Patient will benefit from skilled therapeutic intervention in order to improve the following deficits and impairments:  Decreased range of motion, Increased muscle spasms, Impaired UE functional use, Postural dysfunction, Increased edema, Decreased strength, Impaired flexibility, Pain  Visit Diagnosis: Stiffness of right shoulder, not elsewhere classified  Localized edema  Acute pain of right shoulder     Problem List Patient Active Problem List   Diagnosis Date Noted  . 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 [place], [place], [address] Phone: [phone]   Fax:  [phone]  Name: Nicholas Terry MRN: [id number] Date of Birth: Nov 18, 1950

## 2020-06-19 ENCOUNTER — Encounter: Payer: Self-pay | Admitting: Physical Therapy

## 2020-06-19 ENCOUNTER — Ambulatory Visit: Payer: Medicare PPO | Admitting: Physical Therapy

## 2020-06-19 ENCOUNTER — Other Ambulatory Visit: Payer: Self-pay

## 2020-06-19 DIAGNOSIS — M25612 Stiffness of left shoulder, not elsewhere classified: Secondary | ICD-10-CM

## 2020-06-19 DIAGNOSIS — M25511 Pain in right shoulder: Secondary | ICD-10-CM

## 2020-06-19 DIAGNOSIS — M25611 Stiffness of right shoulder, not elsewhere classified: Secondary | ICD-10-CM

## 2020-06-19 DIAGNOSIS — R600 Localized edema: Secondary | ICD-10-CM

## 2020-06-19 NOTE — Therapy (Signed)
[place]. [place], [place], [address] Phone: [phone]   Fax:  [phone]  Physical Therapy Treatment  Patient Details  Name: Nicholas Terry MRN: [id number] Date of Birth: 06-11-1951 Referring Provider (PT): Supple   Encounter Date: 06/19/2020   PT End of Session - 06/19/20 1058    Visit Number 5    Number of Visits 12    Date for PT Re-Evaluation 07/15/20    Authorization Type [employer]    PT Start Time 1015    PT Stop Time 1105    PT Time Calculation (min) 50 min    Activity Tolerance Patient tolerated treatment well    Behavior During Therapy [facility] for tasks assessed/performed           Past Medical History:  Diagnosis Date  . Arthritis   . BPH (benign prostatic hyperplasia)   . Complication of anesthesia   . DVT (deep venous thrombosis) (HCC)    bil calf  . ED (erectile dysfunction)   . Foot mass, left    left plantar forefoot  . History of colon polyps   . History of kidney stones   . Internal hemorrhoids   . Pneumonia    hx of   . PONV (postoperative nausea and vomiting)   . Seasonal allergies     Past Surgical History:  Procedure Laterality Date  . JOINT REPLACEMENT Left    knee  . KIDNEY STONE SURGERY     x3  . KNEE ARTHROSCOPY Bilateral   . MASS EXCISION Left 03/11/2018   Procedure: Excisional biopsy of left forefoot mass;  Surgeon: Wylene Simmer, MD;  Location: [place];  Service: Orthopedics;  Laterality: Left;  60 mins  . SHOULDER ARTHROSCOPY Right   . TOTAL SHOULDER ARTHROPLASTY Left 05/27/2018  . TOTAL SHOULDER ARTHROPLASTY Left 05/27/2018   Procedure: LEFT TOTAL SHOULDER ARTHROPLASTY;  Surgeon: Justice Britain, MD;  Location: [place];  Service: Orthopedics;  Laterality: Left;  19min  . TOTAL SHOULDER ARTHROPLASTY Right 05/17/2020   Procedure: TOTAL SHOULDER ARTHROPLASTY;  Surgeon: Justice Britain, MD;  [facility];  Service: Orthopedics;  Laterality: Right;   120min    There were no vitals filed for this visit.   Subjective Assessment - 06/19/20 1022    Subjective Pretty good today    Currently in Pain? No/denies                             [facility] Adult PT Treatment/Exercise - 06/19/20 0001      Shoulder Exercises: Standing   Other Standing Exercises AROM flex up wall wiht pillow case x10 RUE on top of LUE      Other Standing Exercises ball squeezes, Bicep curls 15 rep      Shoulder Exercises: Pulleys   Flexion 2 minutes    Other Pulley Exercises scaption 2 min     Other Pulley Exercises IR behind back       Modalities   Modalities Vasopneumatic      Vasopneumatic   Number Minutes Vasopneumatic  10 minutes    Vasopnuematic Location  Shoulder    Vasopneumatic Pressure Medium    Vasopneumatic Temperature  32      Manual Therapy   Manual Therapy Passive ROM;Manual Traction    Passive ROM RUE in all directions within protocol    Manual Traction gentel distraction  PT Short Term Goals - 06/07/20 1059      PT SHORT TERM GOAL #1   Title independent with initial HEP    Status Achieved             PT Long Term Goals - 06/07/20 1059      PT LONG TERM GOAL #1   Title understand and do RICE at home    Status Achieved                 Plan - 06/19/20 1057    Clinical Impression Statement Pt did well overall but he continues to have pain at the end range. He is guarded at times with MT needing multiple cues to relax. No issues with bicep curls and triceps extensions with light resistance.    Stability/Clinical Decision Making Evolving/Moderate complexity    Rehab Potential Good    PT Frequency 2x / week    PT Duration 6 weeks    PT Treatment/Interventions ADLs/Self Care Home Management;Cryotherapy;Electrical Stimulation;Neuromuscular re-education;Therapeutic exercise;Therapeutic activities;Patient/family education;Manual techniques;Vasopneumatic Device    PT Next Visit  Plan follow protocol           Patient will benefit from skilled therapeutic intervention in order to improve the following deficits and impairments:  Decreased range of motion, Increased muscle spasms, Impaired UE functional use, Postural dysfunction, Increased edema, Decreased strength, Impaired flexibility, Pain  Visit Diagnosis: Localized edema  Stiffness of right shoulder, not elsewhere classified  Acute pain of right shoulder  Stiffness of left shoulder, not elsewhere classified     Problem List Patient Active Problem List   Diagnosis Date Noted  . 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 [place], [place], [address] Phone: [phone]   Fax:  [phone]  Name: Nicholas Terry MRN: [id number] Date of Birth: [birth year]

## 2020-06-20 DIAGNOSIS — M1612 Unilateral primary osteoarthritis, left hip: Secondary | ICD-10-CM | POA: Diagnosis not present

## 2020-06-21 ENCOUNTER — Other Ambulatory Visit: Payer: Self-pay

## 2020-06-21 ENCOUNTER — Encounter: Payer: Self-pay | Admitting: Physical Therapy

## 2020-06-21 ENCOUNTER — Ambulatory Visit: Payer: Medicare PPO | Attending: Orthopedic Surgery | Admitting: Physical Therapy

## 2020-06-21 DIAGNOSIS — M25611 Stiffness of right shoulder, not elsewhere classified: Secondary | ICD-10-CM | POA: Insufficient documentation

## 2020-06-21 DIAGNOSIS — R600 Localized edema: Secondary | ICD-10-CM | POA: Insufficient documentation

## 2020-06-21 DIAGNOSIS — M25512 Pain in left shoulder: Secondary | ICD-10-CM | POA: Diagnosis not present

## 2020-06-21 DIAGNOSIS — M25612 Stiffness of left shoulder, not elsewhere classified: Secondary | ICD-10-CM | POA: Insufficient documentation

## 2020-06-21 DIAGNOSIS — M25511 Pain in right shoulder: Secondary | ICD-10-CM

## 2020-06-21 NOTE — Therapy (Signed)
[place]. [place], [place], [address] Phone: [phone]   Fax:  [phone]  Physical Therapy Treatment  Patient Details  Name: CHRISTHOPER BUSBEE MRN: [id number] Date of Birth: 08-18-1950 Referring Provider (PT): Supple   Encounter Date: 06/21/2020   PT End of Session - 06/21/20 1142    Visit Number 6    Number of Visits 12    Date for PT Re-Evaluation 07/15/20    Authorization Type [employer]    PT Start Time 1100    PT Stop Time 1152    PT Time Calculation (min) 52 min    Activity Tolerance Patient limited by pain;Patient tolerated treatment well    Behavior During Therapy [facility] for tasks assessed/performed           Past Medical History:  Diagnosis Date  . Arthritis   . BPH (benign prostatic hyperplasia)   . Complication of anesthesia   . DVT (deep venous thrombosis) (HCC)    bil calf  . ED (erectile dysfunction)   . Foot mass, left    left plantar forefoot  . History of colon polyps   . History of kidney stones   . Internal hemorrhoids   . Pneumonia    hx of   . PONV (postoperative nausea and vomiting)   . Seasonal allergies     Past Surgical History:  Procedure Laterality Date  . JOINT REPLACEMENT Left    knee  . KIDNEY STONE SURGERY     x3  . KNEE ARTHROSCOPY Bilateral   . MASS EXCISION Left 03/11/2018   Procedure: Excisional biopsy of left forefoot mass;  Surgeon: Wylene Simmer, MD;  Location: [place];  Service: Orthopedics;  Laterality: Left;  60 mins  . SHOULDER ARTHROSCOPY Right   . TOTAL SHOULDER ARTHROPLASTY Left 05/27/2018  . TOTAL SHOULDER ARTHROPLASTY Left 05/27/2018   Procedure: LEFT TOTAL SHOULDER ARTHROPLASTY;  Surgeon: Justice Britain, MD;  Location: [place];  Service: Orthopedics;  Laterality: Left;  173min  . TOTAL SHOULDER ARTHROPLASTY Right 05/17/2020   Procedure: TOTAL SHOULDER ARTHROPLASTY;  Surgeon: Justice Britain, MD;  [facility];  Service: Orthopedics;   Laterality: Right;  172min    There were no vitals filed for this visit.   Subjective Assessment - 06/21/20 1103    Subjective Pretty good today    Currently in Pain? No/denies                             [facility] Adult PT Treatment/Exercise - 06/21/20 0001      Shoulder Exercises: Standing   Other Standing Exercises Ball on table RUE ER/IR, AAROM flex     Other Standing Exercises Bicep curls 3lb 2x10      Shoulder Exercises: Pulleys   Flexion 2 minutes    Other Pulley Exercises scaption 2 min     Other Pulley Exercises IR behind back       Modalities   Modalities Vasopneumatic      Vasopneumatic   Number Minutes Vasopneumatic  10 minutes    Vasopnuematic Location  Shoulder    Vasopneumatic Pressure Medium    Vasopneumatic Temperature  32      Manual Therapy   Manual Therapy Passive ROM;Manual Traction    Passive ROM RUE in all directions within protocol    Manual Traction gentel distraction  PT Short Term Goals - 06/07/20 1059      PT SHORT TERM GOAL #1   Title independent with initial HEP    Status Achieved             PT Long Term Goals - 06/07/20 1059      PT LONG TERM GOAL #1   Title understand and do RICE at home    Status Achieved                 Plan - 06/21/20 1143    Clinical Impression Statement Pt able to complete the exercises today, tactile cues needed to prevent trunk rotation with ER/IR. Pt has more pain and guarding today with PROM. PROM ER/IR was very tight and limited today    Stability/Clinical Decision Making Evolving/Moderate complexity    Rehab Potential Good    PT Frequency 2x / week    PT Duration 6 weeks    PT Treatment/Interventions ADLs/Self Care Home Management;Cryotherapy;Electrical Stimulation;Neuromuscular re-education;Therapeutic exercise;Therapeutic activities;Patient/family education;Manual techniques;Vasopneumatic Device    PT Next Visit Plan follow protocol            Patient will benefit from skilled therapeutic intervention in order to improve the following deficits and impairments:  Decreased range of motion, Increased muscle spasms, Impaired UE functional use, Postural dysfunction, Increased edema, Decreased strength, Impaired flexibility, Pain  Visit Diagnosis: Stiffness of right shoulder, not elsewhere classified  Acute pain of right shoulder  Localized edema     Problem List Patient Active Problem List   Diagnosis Date Noted  . 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 [place], [place], [address] Phone: [phone]   Fax:  [phone]  Name: KEYDEN PAVLOV MRN: [id number] Date of Birth: 09-Jul-1951

## 2020-06-26 ENCOUNTER — Other Ambulatory Visit: Payer: Self-pay

## 2020-06-26 ENCOUNTER — Ambulatory Visit: Payer: Medicare PPO | Admitting: Physical Therapy

## 2020-06-26 ENCOUNTER — Encounter: Payer: Self-pay | Admitting: Physical Therapy

## 2020-06-26 DIAGNOSIS — M25512 Pain in left shoulder: Secondary | ICD-10-CM | POA: Diagnosis not present

## 2020-06-26 DIAGNOSIS — M25511 Pain in right shoulder: Secondary | ICD-10-CM | POA: Diagnosis not present

## 2020-06-26 DIAGNOSIS — M25611 Stiffness of right shoulder, not elsewhere classified: Secondary | ICD-10-CM

## 2020-06-26 DIAGNOSIS — R600 Localized edema: Secondary | ICD-10-CM | POA: Diagnosis not present

## 2020-06-26 DIAGNOSIS — M25612 Stiffness of left shoulder, not elsewhere classified: Secondary | ICD-10-CM | POA: Diagnosis not present

## 2020-06-26 NOTE — Therapy (Signed)
[place]. [place], [place], [address] Phone: [phone]   Fax:  [phone]  Physical Therapy Treatment  Patient Details  Name: Nicholas Terry MRN: [id number] Date of Birth: [birth year] Referring Provider (PT): Supple   Encounter Date: 06/26/2020   PT End of Session - 06/26/20 1008    Visit Number 7    Date for PT Re-Evaluation 07/15/20    Authorization Type [employer]    PT Start Time 0930    PT Stop Time 1020    PT Time Calculation (min) 50 min    Activity Tolerance Patient tolerated treatment well    Behavior During Therapy [facility] for tasks assessed/performed           Past Medical History:  Diagnosis Date  . Arthritis   . BPH (benign prostatic hyperplasia)   . Complication of anesthesia   . DVT (deep venous thrombosis) (HCC)    bil calf  . ED (erectile dysfunction)   . Foot mass, left    left plantar forefoot  . History of colon polyps   . History of kidney stones   . Internal hemorrhoids   . Pneumonia    hx of   . PONV (postoperative nausea and vomiting)   . Seasonal allergies     Past Surgical History:  Procedure Laterality Date  . JOINT REPLACEMENT Left    knee  . KIDNEY STONE SURGERY     x3  . KNEE ARTHROSCOPY Bilateral   . MASS EXCISION Left 03/11/2018   Procedure: Excisional biopsy of left forefoot mass;  Surgeon: Wylene Simmer, MD;  Location: [place];  Service: Orthopedics;  Laterality: Left;  60 mins  . SHOULDER ARTHROSCOPY Right   . TOTAL SHOULDER ARTHROPLASTY Left 05/27/2018  . TOTAL SHOULDER ARTHROPLASTY Left 05/27/2018   Procedure: LEFT TOTAL SHOULDER ARTHROPLASTY;  Surgeon: Justice Britain, MD;  Location: [place];  Service: Orthopedics;  Laterality: Left;  129min  . TOTAL SHOULDER ARTHROPLASTY Right 05/17/2020   Procedure: TOTAL SHOULDER ARTHROPLASTY;  Surgeon: Justice Britain, MD;  [facility];  Service: Orthopedics;  Laterality: Right;  168min    There were no  vitals filed for this visit.   Subjective Assessment - 06/26/20 0932    Subjective A little sore but not sure why, Writing Christmas cards yesterday for about an hour    Currently in Pain? Yes    Pain Score 1     Pain Location Head    Pain Orientation Right                             [facility] Adult PT Treatment/Exercise - 06/26/20 0001      Shoulder Exercises: Standing   Other Standing Exercises PROM flex up wall with pillow case2x15 RUE on top of LUE      Other Standing Exercises Bicep curls 5lb 2x10; Tricep Ext red Tband 2x15       Shoulder Exercises: ROM/Strengthening   UBE (Upper Arm Bike) L x 2 min each     Proximal Shoulder Strengthening, Supine CW/CCW ball on table x15 each    Proximal Shoulder Strengthening, Seated Ball on table flex 2x15       Vasopneumatic   Number Minutes Vasopneumatic  10 minutes    Vasopnuematic Location  Shoulder    Vasopneumatic Pressure Medium    Vasopneumatic Temperature  32      Manual Therapy   Manual Therapy  Passive ROM;Manual Traction    Passive ROM RUE in all directions within protocol    Manual Traction gentel distraction                    PT Short Term Goals - 06/07/20 1059      PT SHORT TERM GOAL #1   Title independent with initial HEP    Status Achieved             PT Long Term Goals - 06/07/20 1059      PT LONG TERM GOAL #1   Title understand and do RICE at home    Status Achieved                 Plan - 06/26/20 1009    Clinical Impression Statement Progressed to some light AAROM with RUE without issue. Some pain at the end range of R shoulder potions with interventions. Pt was able to increase rt he resistance with biceps curls and triceps extension. Some pain and end range tightness with PROM.    Stability/Clinical Decision Making Evolving/Moderate complexity    Rehab Potential Good    PT Frequency 2x / week    PT Duration 6 weeks    PT Treatment/Interventions ADLs/Self Care  Home Management;Cryotherapy;Electrical Stimulation;Neuromuscular re-education;Therapeutic exercise;Therapeutic activities;Patient/family education;Manual techniques;Vasopneumatic Device    PT Next Visit Plan follow protocol           Patient will benefit from skilled therapeutic intervention in order to improve the following deficits and impairments:  Decreased range of motion, Increased muscle spasms, Impaired UE functional use, Postural dysfunction, Increased edema, Decreased strength, Impaired flexibility, Pain  Visit Diagnosis: Stiffness of right shoulder, not elsewhere classified  Localized edema  Acute pain of right shoulder     Problem List Patient Active Problem List   Diagnosis Date Noted  . 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 [place], [place], [address] Phone: [phone]   Fax:  [phone]  Name: Nicholas Terry MRN: [id number] Date of Birth: 12-31-1950

## 2020-06-28 ENCOUNTER — Ambulatory Visit: Payer: Medicare PPO | Admitting: Physical Therapy

## 2020-06-28 ENCOUNTER — Encounter: Payer: Self-pay | Admitting: Physical Therapy

## 2020-06-28 ENCOUNTER — Other Ambulatory Visit: Payer: Self-pay

## 2020-06-28 DIAGNOSIS — M25611 Stiffness of right shoulder, not elsewhere classified: Secondary | ICD-10-CM | POA: Diagnosis not present

## 2020-06-28 DIAGNOSIS — R600 Localized edema: Secondary | ICD-10-CM

## 2020-06-28 DIAGNOSIS — M25511 Pain in right shoulder: Secondary | ICD-10-CM | POA: Diagnosis not present

## 2020-06-28 DIAGNOSIS — M25512 Pain in left shoulder: Secondary | ICD-10-CM | POA: Diagnosis not present

## 2020-06-28 DIAGNOSIS — M25612 Stiffness of left shoulder, not elsewhere classified: Secondary | ICD-10-CM | POA: Diagnosis not present

## 2020-06-28 NOTE — Therapy (Signed)
[place]. [place], [place], [address] Phone: [phone]   Fax:  [phone]  Physical Therapy Treatment  Patient Details  Name: Nicholas Terry MRN: [id number] Date of Birth: 09/21/1950 Referring Provider (PT): Supple   Encounter Date: 06/28/2020   PT End of Session - 06/28/20 1140    Visit Number 8    Date for PT Re-Evaluation 07/15/20    Authorization Type [employer]    PT Start Time 1100    PT Stop Time 1151    PT Time Calculation (min) 51 min    Activity Tolerance Patient tolerated treatment well    Behavior During Therapy [facility] for tasks assessed/performed           Past Medical History:  Diagnosis Date  . Arthritis   . BPH (benign prostatic hyperplasia)   . Complication of anesthesia   . DVT (deep venous thrombosis) (HCC)    bil calf  . ED (erectile dysfunction)   . Foot mass, left    left plantar forefoot  . History of colon polyps   . History of kidney stones   . Internal hemorrhoids   . Pneumonia    hx of   . PONV (postoperative nausea and vomiting)   . Seasonal allergies     Past Surgical History:  Procedure Laterality Date  . JOINT REPLACEMENT Left    knee  . KIDNEY STONE SURGERY     x3  . KNEE ARTHROSCOPY Bilateral   . MASS EXCISION Left 03/11/2018   Procedure: Excisional biopsy of left forefoot mass;  Surgeon: Wylene Simmer, MD;  Location: [place];  Service: Orthopedics;  Laterality: Left;  60 mins  . SHOULDER ARTHROSCOPY Right   . TOTAL SHOULDER ARTHROPLASTY Left 05/27/2018  . TOTAL SHOULDER ARTHROPLASTY Left 05/27/2018   Procedure: LEFT TOTAL SHOULDER ARTHROPLASTY;  Surgeon: Justice Britain, MD;  Location: [place];  Service: Orthopedics;  Laterality: Left;  179min  . TOTAL SHOULDER ARTHROPLASTY Right 05/17/2020   Procedure: TOTAL SHOULDER ARTHROPLASTY;  Surgeon: Justice Britain, MD;  [facility];  Service: Orthopedics;  Laterality: Right;  124min    There were no  vitals filed for this visit.   Subjective Assessment - 06/28/20 1058    Subjective "Pretty good" "Went to the gym and walked for about 30 minutes"    Currently in Pain? No/denies                             [facility] Adult PT Treatment/Exercise - 06/28/20 0001      Shoulder Exercises: Standing   Extension Strengthening;Both;20 reps;Theraband    Theraband Level (Shoulder Extension) Level 2 (Red)    Row Strengthening;Both;20 reps;Theraband    Theraband Level (Shoulder Row) Level 2 (Red)    Other Standing Exercises AAROM cane Abd,  Flex, Est, IR up back 2x10    Other Standing Exercises Bicep curls 5lb 2x10; Tricep Ext red Tband 2x15       Shoulder Exercises: ROM/Strengthening   UBE (Upper Arm Bike) L1 x 3 min each      Vasopneumatic   Number Minutes Vasopneumatic  10 minutes    Vasopnuematic Location  Shoulder    Vasopneumatic Pressure Medium    Vasopneumatic Temperature  32      Manual Therapy   Manual Therapy Passive ROM;Manual Traction    Passive ROM RUE in all directions within protocol    Manual Traction gentel distraction  PT Short Term Goals - 06/07/20 1059      PT SHORT TERM GOAL #1   Title independent with initial HEP    Status Achieved             PT Long Term Goals - 06/07/20 1059      PT LONG TERM GOAL #1   Title understand and do RICE at home    Status Achieved                 Plan - 06/28/20 1141    Clinical Impression Statement Pt able to progressed to some AAROM interventions without issues. Tactile cues needed with extensions not to lean trunk forward when performing action. Some initial pain with standing rows but that went away as reps progressed. R shoulder tightness still present with passive external rotation,    Stability/Clinical Decision Making Evolving/Moderate complexity    PT Treatment/Interventions ADLs/Self Care Home Management;Cryotherapy;Electrical Stimulation;Neuromuscular  re-education;Therapeutic exercise;Therapeutic activities;Patient/family education;Manual techniques;Vasopneumatic Device    PT Next Visit Plan follow protocol           Patient will benefit from skilled therapeutic intervention in order to improve the following deficits and impairments:  Decreased range of motion,Increased muscle spasms,Impaired UE functional use,Postural dysfunction,Increased edema,Decreased strength,Impaired flexibility,Pain  Visit Diagnosis: Acute pain of right shoulder  Localized edema  Stiffness of right shoulder, not elsewhere classified     Problem List Patient Active Problem List   Diagnosis Date Noted  . 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 [place], [place], [address] Phone: [phone]   Fax:  [phone]  Name: Nicholas Terry MRN: [id number] Date of Birth: 1950/12/18

## 2020-07-02 DIAGNOSIS — Z471 Aftercare following joint replacement surgery: Secondary | ICD-10-CM | POA: Diagnosis not present

## 2020-07-02 DIAGNOSIS — Z96612 Presence of left artificial shoulder joint: Secondary | ICD-10-CM | POA: Diagnosis not present

## 2020-07-02 DIAGNOSIS — Z96611 Presence of right artificial shoulder joint: Secondary | ICD-10-CM | POA: Diagnosis not present

## 2020-07-03 ENCOUNTER — Encounter: Payer: Self-pay | Admitting: Physical Therapy

## 2020-07-03 ENCOUNTER — Other Ambulatory Visit: Payer: Self-pay

## 2020-07-03 ENCOUNTER — Ambulatory Visit: Payer: Medicare PPO | Admitting: Physical Therapy

## 2020-07-03 DIAGNOSIS — M25611 Stiffness of right shoulder, not elsewhere classified: Secondary | ICD-10-CM | POA: Diagnosis not present

## 2020-07-03 DIAGNOSIS — M25612 Stiffness of left shoulder, not elsewhere classified: Secondary | ICD-10-CM | POA: Diagnosis not present

## 2020-07-03 DIAGNOSIS — M25511 Pain in right shoulder: Secondary | ICD-10-CM

## 2020-07-03 DIAGNOSIS — M25512 Pain in left shoulder: Secondary | ICD-10-CM | POA: Diagnosis not present

## 2020-07-03 DIAGNOSIS — R600 Localized edema: Secondary | ICD-10-CM

## 2020-07-03 NOTE — Therapy (Signed)
[place]. [place], [place], [address] Phone: [phone]   Fax:  [phone]  Physical Therapy Treatment  Patient Details  Name: Nicholas Terry MRN: [id number] Date of Birth: 07-25-1950 Referring Provider (PT): Supple   Encounter Date: 07/03/2020   PT End of Session - 07/03/20 1057    Visit Number 9    Number of Visits 12    Date for PT Re-Evaluation 07/15/20    Authorization Type [employer]    PT Start Time 6384    PT Stop Time 1107    PT Time Calculation (min) 53 min    Activity Tolerance Patient tolerated treatment well    Behavior During Therapy [facility] for tasks assessed/performed           Past Medical History:  Diagnosis Date  . Arthritis   . BPH (benign prostatic hyperplasia)   . Complication of anesthesia   . DVT (deep venous thrombosis) (HCC)    bil calf  . ED (erectile dysfunction)   . Foot mass, left    left plantar forefoot  . History of colon polyps   . History of kidney stones   . Internal hemorrhoids   . Pneumonia    hx of   . PONV (postoperative nausea and vomiting)   . Seasonal allergies     Past Surgical History:  Procedure Laterality Date  . JOINT REPLACEMENT Left    knee  . KIDNEY STONE SURGERY     x3  . KNEE ARTHROSCOPY Bilateral   . MASS EXCISION Left 03/11/2018   Procedure: Excisional biopsy of left forefoot mass;  Surgeon: Wylene Simmer, MD;  Location: [place];  Service: Orthopedics;  Laterality: Left;  60 mins  . SHOULDER ARTHROSCOPY Right   . TOTAL SHOULDER ARTHROPLASTY Left 05/27/2018  . TOTAL SHOULDER ARTHROPLASTY Left 05/27/2018   Procedure: LEFT TOTAL SHOULDER ARTHROPLASTY;  Surgeon: Justice Britain, MD;  Location: [place];  Service: Orthopedics;  Laterality: Left;  153min  . TOTAL SHOULDER ARTHROPLASTY Right 05/17/2020   Procedure: TOTAL SHOULDER ARTHROPLASTY;  Surgeon: Justice Britain, MD;  [facility];  Service: Orthopedics;  Laterality: Right;   133min    There were no vitals filed for this visit.   Subjective Assessment - 07/03/20 1015    Subjective MD was pleased yesterday, discontinue sling    Currently in Pain? No/denies    Pain Location Shoulder    Pain Orientation Right    Pain Descriptors / Indicators --   stiff                            [facility] Adult PT Treatment/Exercise - 07/03/20 0001      Shoulder Exercises: Standing   Extension Strengthening;Both;20 reps;Theraband    Theraband Level (Shoulder Extension) Level 2 (Red)    Row Strengthening;Both;20 reps;Theraband    Theraband Level (Shoulder Row) Level 2 (Red)    Other Standing Exercises AAROM cane Abd,  Flex, Est, IR up back 2x10    Other Standing Exercises Bicep curls 10lb 2x10; Tricep Ext 15 2x15      Shoulder Exercises: ROM/Strengthening   UBE (Upper Arm Bike) L1 x 3 min each      Vasopneumatic   Number Minutes Vasopneumatic  10 minutes    Vasopnuematic Location  Shoulder    Vasopneumatic Pressure Medium    Vasopneumatic Temperature  32      Manual Therapy   Manual Therapy  Passive ROM;Manual Traction    Passive ROM RUE in all directions within protocol    Manual Traction gentel distraction                    PT Short Term Goals - 06/07/20 1059      PT SHORT TERM GOAL #1   Title independent with initial HEP    Status Achieved             PT Long Term Goals - 06/07/20 1059      PT LONG TERM GOAL #1   Title understand and do RICE at home    Status Achieved                 Plan - 07/03/20 1059    Clinical Impression Statement Pt did well today showing good carrion from last session AAROM exercises. Progressed to ER & IR with light load, limited ROM with ER. Tactile cues for posture needed with standing shoulder extensions. Pt was tight today with PROM and he had difficulty relaxing.    Stability/Clinical Decision Making Evolving/Moderate complexity    Rehab Potential Good    PT Frequency 2x / week     PT Duration 6 weeks    PT Treatment/Interventions ADLs/Self Care Home Management;Cryotherapy;Electrical Stimulation;Neuromuscular re-education;Therapeutic exercise;Therapeutic activities;Patient/family education;Manual techniques;Vasopneumatic Device    PT Next Visit Plan follow protocol           Patient will benefit from skilled therapeutic intervention in order to improve the following deficits and impairments:  Decreased range of motion,Increased muscle spasms,Impaired UE functional use,Postural dysfunction,Increased edema,Decreased strength,Impaired flexibility,Pain  Visit Diagnosis: Localized edema  Acute pain of right shoulder  Stiffness of right shoulder, not elsewhere classified     Problem List Patient Active Problem List   Diagnosis Date Noted  . 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 [place]. [place], [place], [address] Phone: [phone]   Fax:  [phone]  Name: Nicholas Terry MRN: [id number] Date of Birth: 06/13/1951

## 2020-07-05 ENCOUNTER — Ambulatory Visit: Payer: Medicare PPO | Admitting: Physical Therapy

## 2020-07-05 ENCOUNTER — Other Ambulatory Visit: Payer: Self-pay

## 2020-07-05 ENCOUNTER — Encounter: Payer: Self-pay | Admitting: Physical Therapy

## 2020-07-05 DIAGNOSIS — M25611 Stiffness of right shoulder, not elsewhere classified: Secondary | ICD-10-CM

## 2020-07-05 DIAGNOSIS — M25612 Stiffness of left shoulder, not elsewhere classified: Secondary | ICD-10-CM | POA: Diagnosis not present

## 2020-07-05 DIAGNOSIS — M25511 Pain in right shoulder: Secondary | ICD-10-CM | POA: Diagnosis not present

## 2020-07-05 DIAGNOSIS — R600 Localized edema: Secondary | ICD-10-CM | POA: Diagnosis not present

## 2020-07-05 DIAGNOSIS — M25512 Pain in left shoulder: Secondary | ICD-10-CM | POA: Diagnosis not present

## 2020-07-05 NOTE — Therapy (Signed)
[place]. [place], [place], [address] Phone: [phone]   Fax:  [phone] Progress Note Reporting Period 06/01/20 to 07/05/20 for visits 1-10  See note below for Objective Data and Assessment of Progress/Goals.      Physical Therapy Treatment  Patient Details  Name: Nicholas Terry MRN: [id number] Date of Birth: 1950-08-09 Referring Provider (PT): Supple   Encounter Date: 07/05/2020   PT End of Session - 07/05/20 1059    Visit Number 10    Date for PT Re-Evaluation 07/15/20    Authorization Type [employer]    PT Start Time 1015    PT Stop Time 1109    PT Time Calculation (min) 54 min    Activity Tolerance Patient tolerated treatment well    Behavior During Therapy [facility] for tasks assessed/performed           Past Medical History:  Diagnosis Date  . Arthritis   . BPH (benign prostatic hyperplasia)   . Complication of anesthesia   . DVT (deep venous thrombosis) (HCC)    bil calf  . ED (erectile dysfunction)   . Foot mass, left    left plantar forefoot  . History of colon polyps   . History of kidney stones   . Internal hemorrhoids   . Pneumonia    hx of   . PONV (postoperative nausea and vomiting)   . Seasonal allergies     Past Surgical History:  Procedure Laterality Date  . JOINT REPLACEMENT Left    knee  . KIDNEY STONE SURGERY     x3  . KNEE ARTHROSCOPY Bilateral   . MASS EXCISION Left 03/11/2018   Procedure: Excisional biopsy of left forefoot mass;  Surgeon: Wylene Simmer, MD;  Location: [place];  Service: Orthopedics;  Laterality: Left;  60 mins  . SHOULDER ARTHROSCOPY Right   . TOTAL SHOULDER ARTHROPLASTY Left 05/27/2018  . TOTAL SHOULDER ARTHROPLASTY Left 05/27/2018   Procedure: LEFT TOTAL SHOULDER ARTHROPLASTY;  Surgeon: Justice Britain, MD;  Location: [place];  Service: Orthopedics;  Laterality: Left;  146min  . TOTAL SHOULDER ARTHROPLASTY Right 05/17/2020    Procedure: TOTAL SHOULDER ARTHROPLASTY;  Surgeon: Justice Britain, MD;  Location: [facility];  Service: Orthopedics;  Laterality: Right;  144min    There were no vitals filed for this visit.   Subjective Assessment - 07/05/20 1018    Subjective Shoulder is pretty good, Cat dies yesterday not feeling good.    Currently in Pain? No/denies                             [facility] Adult PT Treatment/Exercise - 07/05/20 0001      Shoulder Exercises: Supine   Flexion AAROM;Right;15 reps      Shoulder Exercises: Standing   Extension Strengthening;Both;20 reps;Theraband    Theraband Level (Shoulder Extension) Level 2 (Red)    Row Strengthening;Both;20 reps;Theraband    Theraband Level (Shoulder Row) Level 2 (Red)    Other Standing Exercises AAROM cane Abd,  Flex, Ext, Abd,  IR up back 2x10    Other Standing Exercises Bicep curls 20lb 2x10; Tricep Ext 20 2x15      Shoulder Exercises: ROM/Strengthening   UBE (Upper Arm Bike) L1 x 3 min each      Vasopneumatic   Number Minutes Vasopneumatic  10 minutes    Vasopnuematic Location  Shoulder    Vasopneumatic Pressure Medium  Vasopneumatic Temperature  32      Manual Therapy   Manual Therapy Passive ROM;Manual Traction    Passive ROM RUE in all directions within protocol    Manual Traction gentel distraction                    PT Short Term Goals - 06/07/20 1059      PT SHORT TERM GOAL #1   Title independent with initial HEP    Status Achieved             PT Long Term Goals - 06/07/20 1059      PT LONG TERM GOAL #1   Title understand and do RICE at home    Status Achieved                 Plan - 07/05/20 1100    Clinical Impression Statement Emotional day for pt due to his cat passing yesterday. He did well with post interventions with good effort. Tactile cues needed to squeeze shoulder blades together with standing rows. Increase resistance tolerated with triceps ext and biceps  curls. Pecs and sub  scap on R side is was very tight noted with PROM. Passive ER remains most limited.   Stability/Clinical Decision Making Evolving/Moderate complexity    Rehab Potential Good    PT Frequency 2x / week    PT Duration 6 weeks    PT Treatment/Interventions ADLs/Self Care Home Management;Cryotherapy;Electrical Stimulation;Neuromuscular re-education;Therapeutic exercise;Therapeutic activities;Patient/family education;Manual techniques;Vasopneumatic Device    PT Next Visit Plan follow protocol           Patient will benefit from skilled therapeutic intervention in order to improve the following deficits and impairments:  Decreased range of motion,Increased muscle spasms,Impaired UE functional use,Postural dysfunction,Increased edema,Decreased strength,Impaired flexibility,Pain  Visit Diagnosis: Localized edema  Stiffness of right shoulder, not elsewhere classified  Acute pain of right shoulder     Problem List Patient Active Problem List   Diagnosis Date Noted  . 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. [place], [place], [address] Phone: [phone]   Fax:  [phone]  Name: Nicholas Terry MRN: [id number] Date of Birth: 1951/02/05

## 2020-07-10 ENCOUNTER — Other Ambulatory Visit: Payer: Self-pay

## 2020-07-10 ENCOUNTER — Encounter: Payer: Self-pay | Admitting: Physical Therapy

## 2020-07-10 ENCOUNTER — Ambulatory Visit: Payer: Medicare PPO | Admitting: Physical Therapy

## 2020-07-10 DIAGNOSIS — M25611 Stiffness of right shoulder, not elsewhere classified: Secondary | ICD-10-CM

## 2020-07-10 DIAGNOSIS — M25511 Pain in right shoulder: Secondary | ICD-10-CM | POA: Diagnosis not present

## 2020-07-10 DIAGNOSIS — M25612 Stiffness of left shoulder, not elsewhere classified: Secondary | ICD-10-CM | POA: Diagnosis not present

## 2020-07-10 DIAGNOSIS — R600 Localized edema: Secondary | ICD-10-CM

## 2020-07-10 DIAGNOSIS — M25512 Pain in left shoulder: Secondary | ICD-10-CM | POA: Diagnosis not present

## 2020-07-10 NOTE — Therapy (Signed)
[place]. [place], [place], [address] Phone: [phone]   Fax:  [phone]  Physical Therapy Treatment  Patient Details  Name: Nicholas Terry MRN: [id number] Date of Birth: [birth year] Referring Provider (PT): Supple   Encounter Date: 07/10/2020   PT End of Session - 07/10/20 1055    Visit Number 11    Number of Visits 12    Date for PT Re-Evaluation 07/15/20    Authorization Type [employer]    PT Start Time 1015    PT Stop Time 1107    PT Time Calculation (min) 52 min    Activity Tolerance Patient tolerated treatment well    Behavior During Therapy [facility] for tasks assessed/performed           Past Medical History:  Diagnosis Date  . Arthritis   . BPH (benign prostatic hyperplasia)   . Complication of anesthesia   . DVT (deep venous thrombosis) (HCC)    bil calf  . ED (erectile dysfunction)   . Foot mass, left    left plantar forefoot  . History of colon polyps   . History of kidney stones   . Internal hemorrhoids   . Pneumonia    hx of   . PONV (postoperative nausea and vomiting)   . Seasonal allergies     Past Surgical History:  Procedure Laterality Date  . JOINT REPLACEMENT Left    knee  . KIDNEY STONE SURGERY     x3  . KNEE ARTHROSCOPY Bilateral   . MASS EXCISION Left 03/11/2018   Procedure: Excisional biopsy of left forefoot mass;  Surgeon: Wylene Simmer, MD;  Location: [place];  Service: Orthopedics;  Laterality: Left;  60 mins  . SHOULDER ARTHROSCOPY Right   . TOTAL SHOULDER ARTHROPLASTY Left 05/27/2018  . TOTAL SHOULDER ARTHROPLASTY Left 05/27/2018   Procedure: LEFT TOTAL SHOULDER ARTHROPLASTY;  Surgeon: Justice Britain, MD;  Location: [place];  Service: Orthopedics;  Laterality: Left;  19min  . TOTAL SHOULDER ARTHROPLASTY Right 05/17/2020   Procedure: TOTAL SHOULDER ARTHROPLASTY;  Surgeon: Justice Britain, MD;  Location[facility];  Service: Orthopedics;  Laterality: Right;   110min    There were no vitals filed for this visit.   Subjective Assessment - 07/10/20 1015    Subjective Went to the gym and did leg stuff  kinda sore, Shoulder is pretty good    Currently in Pain? No/denies                             [facility] Adult PT Treatment/Exercise - 07/10/20 0001      Shoulder Exercises: Seated   Other Seated Exercises Bent over Ext & rev flys  1lb 2x10      Shoulder Exercises: Standing   Flexion Right;20 reps;AROM   pillow case   ABduction AROM;Right;20 reps   pillow case   Extension Strengthening;Both;20 reps;Theraband    Theraband Level (Shoulder Extension) Level 3 (Green)    Row Strengthening;Both;Theraband;15 reps   x2   Theraband Level (Shoulder Row) Level 3 (Green)    Other Standing Exercises 3 level cabined treaches flex & Abdx 10, weighted behind back passes x10 each      Shoulder Exercises: ROM/Strengthening   UBE (Upper Arm Bike) L1 x 3 min each      Vasopneumatic   Number Minutes Vasopneumatic  10 minutes    Vasopnuematic Location  Shoulder    Vasopneumatic Pressure  Medium    Vasopneumatic Temperature  32      Manual Therapy   Manual Therapy Passive ROM;Manual Traction    Passive ROM RUE in all directions within protocol    Manual Traction gentel distraction                    PT Short Term Goals - 06/07/20 1059      PT SHORT TERM GOAL #1   Title independent with initial HEP    Status Achieved             PT Long Term Goals - 06/07/20 1059      PT LONG TERM GOAL #1   Title understand and do RICE at home    Status Achieved                 Plan - 07/10/20 1056    Clinical Impression Statement Pt did well with a progressed treatment session, he did have some pain at eng range with active up wall stretching. He was able to tolerated increase resistance with rows and extensions.Cues not to swing arms with bent over reverse fly's. Some initial assist needed to reach the upper level of the  cabinet before pt was able to do it on his own    Stability/Clinical Decision Making Evolving/Moderate complexity    Rehab Potential Good    PT Frequency 2x / week    PT Duration 6 weeks    PT Treatment/Interventions ADLs/Self Care Home Management;Cryotherapy;Electrical Stimulation;Neuromuscular re-education;Therapeutic exercise;Therapeutic activities;Patient/family education;Manual techniques;Vasopneumatic Device    PT Next Visit Plan follow protocol           Patient will benefit from skilled therapeutic intervention in order to improve the following deficits and impairments:  Decreased range of motion,Increased muscle spasms,Impaired UE functional use,Postural dysfunction,Increased edema,Decreased strength,Impaired flexibility,Pain  Visit Diagnosis: Acute pain of right shoulder  Stiffness of right shoulder, not elsewhere classified  Localized edema     Problem List Patient Active Problem List   Diagnosis Date Noted  . 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 [place], [place], [address] Phone: [phone]   Fax:  [phone]  Name: Nicholas Terry MRN: [id number] Date of Birth: 1950/07/28

## 2020-07-12 ENCOUNTER — Other Ambulatory Visit: Payer: Self-pay

## 2020-07-12 ENCOUNTER — Ambulatory Visit: Payer: Medicare PPO | Admitting: Physical Therapy

## 2020-07-12 ENCOUNTER — Encounter: Payer: Self-pay | Admitting: Physical Therapy

## 2020-07-12 DIAGNOSIS — M25512 Pain in left shoulder: Secondary | ICD-10-CM

## 2020-07-12 DIAGNOSIS — M25611 Stiffness of right shoulder, not elsewhere classified: Secondary | ICD-10-CM

## 2020-07-12 DIAGNOSIS — R600 Localized edema: Secondary | ICD-10-CM

## 2020-07-12 DIAGNOSIS — M25511 Pain in right shoulder: Secondary | ICD-10-CM

## 2020-07-12 DIAGNOSIS — M25612 Stiffness of left shoulder, not elsewhere classified: Secondary | ICD-10-CM | POA: Diagnosis not present

## 2020-07-12 NOTE — Therapy (Signed)
[place]. [place], [place], [address] Phone: [phone]   Fax:  [phone]  Physical Therapy Treatment  Patient Details  Name: Nicholas Terry MRN: [id number] Date of Birth: 05-May-1951 Referring Provider (PT): Supple   Encounter Date: 07/12/2020   PT End of Session - 07/12/20 1100    Visit Number 12    Date for PT Re-Evaluation 07/15/20    Authorization Type [employer]    PT Start Time 1015    PT Stop Time 1108    PT Time Calculation (min) 53 min    Activity Tolerance Patient tolerated treatment well    Behavior During Therapy [facility] for tasks assessed/performed           Past Medical History:  Diagnosis Date  . Arthritis   . BPH (benign prostatic hyperplasia)   . Complication of anesthesia   . DVT (deep venous thrombosis) (HCC)    bil calf  . ED (erectile dysfunction)   . Foot mass, left    left plantar forefoot  . History of colon polyps   . History of kidney stones   . Internal hemorrhoids   . Pneumonia    hx of   . PONV (postoperative nausea and vomiting)   . Seasonal allergies     Past Surgical History:  Procedure Laterality Date  . JOINT REPLACEMENT Left    knee  . KIDNEY STONE SURGERY     x3  . KNEE ARTHROSCOPY Bilateral   . MASS EXCISION Left 03/11/2018   Procedure: Excisional biopsy of left forefoot mass;  Surgeon: Wylene Simmer, MD;  Location: [place];  Service: Orthopedics;  Laterality: Left;  60 mins  . SHOULDER ARTHROSCOPY Right   . TOTAL SHOULDER ARTHROPLASTY Left 05/27/2018  . TOTAL SHOULDER ARTHROPLASTY Left 05/27/2018   Procedure: LEFT TOTAL SHOULDER ARTHROPLASTY;  Surgeon: Justice Britain, MD;  Location: [place];  Service: Orthopedics;  Laterality: Left;  164min  . TOTAL SHOULDER ARTHROPLASTY Right 05/17/2020   Procedure: TOTAL SHOULDER ARTHROPLASTY;  Surgeon: Justice Britain, MD;  Location[facility];  Service: Orthopedics;  Laterality: Right;  159min    There were  no vitals filed for this visit.   Subjective Assessment - 07/12/20 0940    Subjective Doing pretty good    Currently in Pain? Yes    Pain Location Shoulder    Pain Orientation Right    Pain Descriptors / Indicators Sore                             [facility] Adult PT Treatment/Exercise - 07/12/20 0001      Shoulder Exercises: Seated   Other Seated Exercises Bent over row3lb, Ext & rev flys  1lb 2x10      Shoulder Exercises: Standing   Flexion Right;20 reps;AROM   pillow case   ABduction AROM;Right;20 reps   pillow case   Extension Strengthening;Both;Weights;20 reps    Extension Weight (lbs) 5    Row Strengthening;Both;Theraband;15 reps    Theraband Level (Shoulder Row) Level 3 (Green)    Other Standing Exercises 3 level cabined treaches flex & Abdx 10, weighted behind back passes x10 each      Shoulder Exercises: ROM/Strengthening   UBE (Upper Arm Bike) L1 x 3 min each      Vasopneumatic   Number Minutes Vasopneumatic  10 minutes    Vasopnuematic Location  Shoulder    Vasopneumatic Pressure Medium  Vasopneumatic Temperature  32      Manual Therapy   Manual Therapy Passive ROM;Manual Traction    Passive ROM RUE in all directions within protocol    Manual Traction gentel distraction                    PT Short Term Goals - 06/07/20 1059      PT SHORT TERM GOAL #1   Title independent with initial HEP    Status Achieved             PT Long Term Goals - 06/07/20 1059      PT LONG TERM GOAL #1   Title understand and do RICE at home    Status Achieved                 Plan - 07/12/20 1100    Clinical Impression Statement Pt report some increase soreness from last session so interventions remains the same. Added machine level shoulder extensions without issue with light resistance. Cues to hold muscle contraction with bent over shoulder exercises. Tactile cues given to R shoulder to keep it down with rows.    Stability/Clinical  Decision Making Evolving/Moderate complexity    Rehab Potential Good    PT Frequency 2x / week    PT Duration 6 weeks    PT Treatment/Interventions ADLs/Self Care Home Management;Cryotherapy;Electrical Stimulation;Neuromuscular re-education;Therapeutic exercise;Therapeutic activities;Patient/family education;Manual techniques;Vasopneumatic Device    PT Next Visit Plan follow protocol           Patient will benefit from skilled therapeutic intervention in order to improve the following deficits and impairments:  Decreased range of motion,Increased muscle spasms,Impaired UE functional use,Postural dysfunction,Increased edema,Decreased strength,Impaired flexibility,Pain  Visit Diagnosis: Stiffness of right shoulder, not elsewhere classified  Localized edema  Acute pain of right shoulder  Stiffness of left shoulder, not elsewhere classified  Acute pain of left shoulder     Problem List Patient Active Problem List   Diagnosis Date Noted  . 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 [place], [place], [address] Phone: [phone]   Fax:  [phone]  Name: Nicholas Terry MRN: [id number] Date of Birth: 07-30-1950

## 2020-07-17 ENCOUNTER — Ambulatory Visit: Payer: Medicare PPO | Admitting: Physical Therapy

## 2020-07-17 ENCOUNTER — Encounter: Payer: Self-pay | Admitting: Physical Therapy

## 2020-07-17 ENCOUNTER — Other Ambulatory Visit: Payer: Self-pay

## 2020-07-17 DIAGNOSIS — M25511 Pain in right shoulder: Secondary | ICD-10-CM | POA: Diagnosis not present

## 2020-07-17 DIAGNOSIS — M25611 Stiffness of right shoulder, not elsewhere classified: Secondary | ICD-10-CM

## 2020-07-17 DIAGNOSIS — R600 Localized edema: Secondary | ICD-10-CM | POA: Diagnosis not present

## 2020-07-17 DIAGNOSIS — M25612 Stiffness of left shoulder, not elsewhere classified: Secondary | ICD-10-CM | POA: Diagnosis not present

## 2020-07-17 DIAGNOSIS — M25512 Pain in left shoulder: Secondary | ICD-10-CM | POA: Diagnosis not present

## 2020-07-17 NOTE — Therapy (Signed)
[facility] [address]. [place], [place], [address] Phone: [phone]   Fax:  [phone]  Physical Therapy Treatment  Patient Details  Name: Nicholas Terry MRN: [id number] Date of Birth: February 24, 1951 Referring Provider (PT): Supple   Encounter Date: 07/17/2020   PT End of Session - 07/17/20 1105    Visit Number 13    Date for PT Re-Evaluation 07/15/20    Authorization Type [employer]    PT Start Time 1016    PT Stop Time 1113    PT Time Calculation (min) 57 min    Activity Tolerance Patient tolerated treatment well    Behavior During Therapy [facility] for tasks assessed/performed           Past Medical History:  Diagnosis Date  . Arthritis   . BPH (benign prostatic hyperplasia)   . Complication of anesthesia   . DVT (deep venous thrombosis) (HCC)    bil calf  . ED (erectile dysfunction)   . Foot mass, left    left plantar forefoot  . History of colon polyps   . History of kidney stones   . Internal hemorrhoids   . Pneumonia    hx of   . PONV (postoperative nausea and vomiting)   . Seasonal allergies     Past Surgical History:  Procedure Laterality Date  . JOINT REPLACEMENT Left    knee  . KIDNEY STONE SURGERY     x3  . KNEE ARTHROSCOPY Bilateral   . MASS EXCISION Left 03/11/2018   Procedure: Excisional biopsy of left forefoot mass;  Surgeon: Toni Arthurs, MD;  Location: [facility];  Service: Orthopedics;  Laterality: Left;  60 mins  . SHOULDER ARTHROSCOPY Right   . TOTAL SHOULDER ARTHROPLASTY Left 05/27/2018  . TOTAL SHOULDER ARTHROPLASTY Left 05/27/2018   Procedure: LEFT TOTAL SHOULDER ARTHROPLASTY;  Surgeon: Francena Hanly, MD;  [facility];  Service: Orthopedics;  Laterality: Left;   . TOTAL SHOULDER ARTHROPLASTY Right 05/17/2020   Procedure: TOTAL SHOULDER ARTHROPLASTY;  Surgeon: Francena Hanly, MD;  [facility];  Service: Orthopedics;  Laterality: Right;     There were  no vitals filed for this visit.   Subjective Assessment - 07/17/20 1018    Subjective Pretty good, went to the gym for a little bit    Currently in Pain? No/denies              [facility] PT Assessment - 07/17/20 0001      ROM / Strength   AROM / PROM / Strength AROM      AROM   AROM Assessment Site Shoulder    Right/Left Shoulder Right    Right Shoulder Flexion 119 Degrees    Right Shoulder ABduction 85 Degrees    Right Shoulder Internal Rotation 40 Degrees    Right Shoulder External Rotation 59 Degrees                         [facility] Adult PT Treatment/Exercise - 07/17/20 0001      Shoulder Exercises: Standing   External Rotation Theraband;20 reps;Right    Theraband Level (Shoulder External Rotation) Level 1 (Yellow)    Internal Rotation Strengthening;Right;20 reps;Theraband    Flexion Strengthening;20 reps;Weights;Both    Shoulder Flexion Weight (lbs) 3    ABduction AROM;20 reps;Both;Weights    Extension Strengthening;Both;Weights;15 reps   x2   Extension Weight (lbs) 5    Other Standing Exercises 3 level  cabined treaches flex & Abdx 10, weighted behind back passes 2x15 each      Shoulder Exercises: ROM/Strengthening   UBE (Upper Arm Bike) L1 x 3 min each    Other ROM/Strengthening Exercises Rows % lats 15lb 2x10      Vasopneumatic   Number Minutes Vasopneumatic  10 minutes    Vasopnuematic Location  Shoulder    Vasopneumatic Pressure Medium      Manual Therapy   Manual Therapy Passive ROM;Manual Traction;Joint mobilization    Joint Mobilization Grades 2-3    Passive ROM RUE in all directions    Manual Traction gentel distraction                    PT Short Term Goals - 06/07/20 1059      PT SHORT TERM GOAL #1   Title independent with initial HEP    Status Achieved             PT Long Term Goals - 06/07/20 1059      PT LONG TERM GOAL #1   Title understand and do RICE at home    Status Achieved                 Plan -  07/17/20 1110    Clinical Impression Statement Pt did well with a progression of interventions. Some shoulder elevation noted with standing flexion. He was unable to tolerate resistance with standing shoulder abduction. Pt R shoulder ROM is limited overall more so with internal and external rotation. Cues needed to get pt to relax with MT. Tactile cues for posture needed with seated rows and lats.    Stability/Clinical Decision Making Evolving/Moderate complexity    Rehab Potential Good    PT Frequency 2x / week    PT Duration 6 weeks    PT Treatment/Interventions ADLs/Self Care Home Management;Cryotherapy;Electrical Stimulation;Neuromuscular re-education;Therapeutic exercise;Therapeutic activities;Patient/family education;Manual techniques;Vasopneumatic Device    PT Next Visit Plan follow protocol           Patient will benefit from skilled therapeutic intervention in order to improve the following deficits and impairments:  Decreased range of motion,Increased muscle spasms,Impaired UE functional use,Postural dysfunction,Increased edema,Decreased strength,Impaired flexibility,Pain  Visit Diagnosis: Localized edema  Acute pain of right shoulder  Stiffness of right shoulder, not elsewhere classified     Problem List Patient Active Problem List   Diagnosis Date Noted  . 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 [place], [place], [address] Phone: [phone]   Fax:  [phone]  Name: Nicholas Terry MRN: [id number] Date of Birth: 09/28/1950

## 2020-07-19 ENCOUNTER — Ambulatory Visit: Payer: Medicare PPO | Admitting: Physical Therapy

## 2020-07-24 ENCOUNTER — Other Ambulatory Visit: Payer: Self-pay

## 2020-07-24 ENCOUNTER — Encounter: Payer: Self-pay | Admitting: Physical Therapy

## 2020-07-24 ENCOUNTER — Ambulatory Visit: Payer: Medicare PPO | Attending: Orthopedic Surgery | Admitting: Physical Therapy

## 2020-07-24 DIAGNOSIS — M25611 Stiffness of right shoulder, not elsewhere classified: Secondary | ICD-10-CM | POA: Diagnosis not present

## 2020-07-24 DIAGNOSIS — M25511 Pain in right shoulder: Secondary | ICD-10-CM | POA: Insufficient documentation

## 2020-07-24 DIAGNOSIS — M25612 Stiffness of left shoulder, not elsewhere classified: Secondary | ICD-10-CM | POA: Diagnosis not present

## 2020-07-24 DIAGNOSIS — R600 Localized edema: Secondary | ICD-10-CM | POA: Diagnosis not present

## 2020-07-24 NOTE — Therapy (Signed)
[place]. [place], [place], [address] Phone: [phone]   Fax:  [phone]  Physical Therapy Treatment  Patient Details  Name: Nicholas Terry MRN: [id number] Date of Birth: [birth year] Referring Provider (PT): Supple   Encounter Date: 07/24/2020   PT End of Session - 07/24/20 1142    Visit Number 14    Number of Visits 24    Date for PT Re-Evaluation 08/29/20    Authorization Type [employer]    PT Start Time 1100    PT Stop Time 1153    PT Time Calculation (min) 53 min    Activity Tolerance Patient tolerated treatment well    Behavior During Therapy [facility] for tasks assessed/performed           Past Medical History:  Diagnosis Date  . Arthritis   . BPH (benign prostatic hyperplasia)   . Complication of anesthesia   . DVT (deep venous thrombosis) (HCC)    bil calf  . ED (erectile dysfunction)   . Foot mass, left    left plantar forefoot  . History of colon polyps   . History of kidney stones   . Internal hemorrhoids   . Pneumonia    hx of   . PONV (postoperative nausea and vomiting)   . Seasonal allergies     Past Surgical History:  Procedure Laterality Date  . JOINT REPLACEMENT Left    knee  . KIDNEY STONE SURGERY     x3  . KNEE ARTHROSCOPY Bilateral   . MASS EXCISION Left 03/11/2018   Procedure: Excisional biopsy of left forefoot mass;  Surgeon: Wylene Simmer, MD;  Location: [place];  Service: Orthopedics;  Laterality: Left;  60 mins  . SHOULDER ARTHROSCOPY Right   . TOTAL SHOULDER ARTHROPLASTY Left 05/27/2018  . TOTAL SHOULDER ARTHROPLASTY Left 05/27/2018   Procedure: LEFT TOTAL SHOULDER ARTHROPLASTY;  Surgeon: Justice Britain, MD;  Location: [place];  Service: Orthopedics;  Laterality: Left;  171min  . TOTAL SHOULDER ARTHROPLASTY Right 05/17/2020   Procedure: TOTAL SHOULDER ARTHROPLASTY;  Surgeon: Justice Britain, MD;  [facility];  Service: Orthopedics;  Laterality: Right;   165min    There were no vitals filed for this visit.   Subjective Assessment - 07/24/20 1059    Subjective "Pretty good, getting a shot in a couple of weeks in my hip"    Currently in Pain? No/denies                             [facility] Adult PT Treatment/Exercise - 07/24/20 0001      Shoulder Exercises: Standing   External Rotation Theraband;20 reps;Right    Theraband Level (Shoulder External Rotation) Level 1 (Yellow)    Flexion Strengthening;Weights;Both;15 reps   x2   Shoulder Flexion Weight (lbs) 2    ABduction AROM;Both;Weights;15 reps   x2   Shoulder ABduction Weight (lbs) 1    Other Standing Exercises Flex and abd up wall pillow case 2x10      Shoulder Exercises: ROM/Strengthening   UBE (Upper Arm Bike) L1.5 x 3 min each    Other ROM/Strengthening Exercises Rows & lats 15lb 2x10      Shoulder Exercises: Stretch   Corner Stretch 10 seconds;5 reps      Vasopneumatic   Number Minutes Vasopneumatic  10 minutes    Vasopnuematic Location  Shoulder    Vasopneumatic Pressure Medium    Vasopneumatic Temperature  32      Manual Therapy   Manual Therapy Passive ROM;Manual Traction;Joint mobilization    Joint Mobilization Grades 2-3    Passive ROM RUE in all directions    Manual Traction gentel distraction                    PT Short Term Goals - 06/07/20 1059      PT SHORT TERM GOAL #1   Title independent with initial HEP    Status Achieved             PT Long Term Goals - 06/07/20 1059      PT LONG TERM GOAL #1   Title understand and do RICE at home    Status Achieved                 Plan - 07/24/20 1143    Clinical Impression Statement R shoulder ROM limitations remains with hard end feels with passive stretching. R shoulder pains at the end range, some compensation with ER/IR due to the lack of motion. Cues not to swing weight with standing shoulder abduction. Cues needed to relax with MT, cues to keep elbow extended with  shoulder flexion. Added corner stretch to HEP    Stability/Clinical Decision Making Evolving/Moderate complexity    Rehab Potential Good    PT Frequency 2x / week    PT Duration 6 weeks    PT Treatment/Interventions ADLs/Self Care Home Management;Cryotherapy;Electrical Stimulation;Neuromuscular re-education;Therapeutic exercise;Therapeutic activities;Patient/family education;Manual techniques;Vasopneumatic Device    PT Next Visit Plan follow protocol           Patient will benefit from skilled therapeutic intervention in order to improve the following deficits and impairments:  Decreased range of motion,Increased muscle spasms,Impaired UE functional use,Postural dysfunction,Increased edema,Decreased strength,Impaired flexibility,Pain  Visit Diagnosis: Acute pain of right shoulder  Stiffness of right shoulder, not elsewhere classified  Localized edema     Problem List Patient Active Problem List   Diagnosis Date Noted  . 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 [place], [place], [address] Phone: [phone]   Fax:  [phone]  Name: Nicholas Terry MRN: [id number] Date of Birth: 02/10/1951

## 2020-07-26 ENCOUNTER — Other Ambulatory Visit: Payer: Self-pay

## 2020-07-26 ENCOUNTER — Encounter: Payer: Self-pay | Admitting: Physical Therapy

## 2020-07-26 ENCOUNTER — Ambulatory Visit: Payer: Medicare PPO | Admitting: Physical Therapy

## 2020-07-26 DIAGNOSIS — M25511 Pain in right shoulder: Secondary | ICD-10-CM | POA: Diagnosis not present

## 2020-07-26 DIAGNOSIS — M25611 Stiffness of right shoulder, not elsewhere classified: Secondary | ICD-10-CM | POA: Diagnosis not present

## 2020-07-26 DIAGNOSIS — R600 Localized edema: Secondary | ICD-10-CM | POA: Diagnosis not present

## 2020-07-26 DIAGNOSIS — M25612 Stiffness of left shoulder, not elsewhere classified: Secondary | ICD-10-CM | POA: Diagnosis not present

## 2020-07-26 NOTE — Therapy (Signed)
[place]. [place], [place], [address] Phone: [phone]   Fax:  [phone]  Physical Therapy Treatment  Patient Details  Name: Nicholas Terry MRN: [id number] Date of Birth: September 27, 1950 Referring Provider (PT): Supple   Encounter Date: 07/26/2020   PT End of Session - 07/26/20 1141    Visit Number 15    Date for PT Re-Evaluation 08/29/20    Authorization Type [employer]    PT Start Time 1100    PT Stop Time 1151    PT Time Calculation (min) 51 min    Activity Tolerance Patient tolerated treatment well    Behavior During Therapy [facility] for tasks assessed/performed           Past Medical History:  Diagnosis Date  . Arthritis   . BPH (benign prostatic hyperplasia)   . Complication of anesthesia   . DVT (deep venous thrombosis) (HCC)    bil calf  . ED (erectile dysfunction)   . Foot mass, left    left plantar forefoot  . History of colon polyps   . History of kidney stones   . Internal hemorrhoids   . Pneumonia    hx of   . PONV (postoperative nausea and vomiting)   . Seasonal allergies     Past Surgical History:  Procedure Laterality Date  . JOINT REPLACEMENT Left    knee  . KIDNEY STONE SURGERY     x3  . KNEE ARTHROSCOPY Bilateral   . MASS EXCISION Left 03/11/2018   Procedure: Excisional biopsy of left forefoot mass;  Surgeon: Wylene Simmer, MD;  Location: [place];  Service: Orthopedics;  Laterality: Left;  60 mins  . SHOULDER ARTHROSCOPY Right   . TOTAL SHOULDER ARTHROPLASTY Left 05/27/2018  . TOTAL SHOULDER ARTHROPLASTY Left 05/27/2018   Procedure: LEFT TOTAL SHOULDER ARTHROPLASTY;  Surgeon: Justice Britain, MD;  Location: [place];  Service: Orthopedics;  Laterality: Left;  17min  . TOTAL SHOULDER ARTHROPLASTY Right 05/17/2020   Procedure: TOTAL SHOULDER ARTHROPLASTY;  Surgeon: Justice Britain, MD;  [facility];  Service: Orthopedics;  Laterality: Right;  122min    There were no  vitals filed for this visit.   Subjective Assessment - 07/26/20 1101    Subjective Shoulder is pretty good today, sore all over, picked up sticks yesterday in yard for about two hours.    Currently in Pain? No/denies                             [facility] Adult PT Treatment/Exercise - 07/26/20 0001      Shoulder Exercises: Seated   Other Seated Exercises Bent over row 3lb, Ext & rev flys 2lb x10      Shoulder Exercises: Standing   Flexion Strengthening;Weights;Both;15 reps    Shoulder Flexion Weight (lbs) 2    Extension Strengthening;Both;Weights;20 reps    Extension Weight (lbs) 10    Other Standing Exercises Flex and abd up wall pillow case      Shoulder Exercises: ROM/Strengthening   UBE (Upper Arm Bike) L1.5 x 3 min each    Other ROM/Strengthening Exercises Rows & lats 15lb 2x10      Shoulder Exercises: Stretch   Corner Stretch 10 seconds;5 reps      Vasopneumatic   Number Minutes Vasopneumatic  10 minutes    Vasopnuematic Location  Shoulder    Vasopneumatic Pressure Medium    Vasopneumatic Temperature  32  Manual Therapy   Manual Therapy Passive ROM;Manual Traction;Joint mobilization    Manual therapy comments Shoulder girg;le muscles are very tight    Joint Mobilization Grades 2-3    Passive ROM RUE in all directions    Manual Traction gentel distraction                    PT Short Term Goals - 06/07/20 1059      PT SHORT TERM GOAL #1   Title independent with initial HEP    Status Achieved             PT Long Term Goals - 06/07/20 1059      PT LONG TERM GOAL #1   Title understand and do RICE at home    Status Achieved                 Plan - 07/26/20 1142    Clinical Impression Statement Hard end feel remains with PROM with some pain. Progressed with increase weight doing seated rows, seated lat pull downs, an standing shoulder extensions. ROM limited with bent over rev fly's. Shoulder elevation noted with flexion  and abduction..    Stability/Clinical Decision Making Evolving/Moderate complexity    Rehab Potential Good    PT Frequency 2x / week    PT Duration 6 weeks    PT Treatment/Interventions ADLs/Self Care Home Management;Cryotherapy;Electrical Stimulation;Neuromuscular re-education;Therapeutic exercise;Therapeutic activities;Patient/family education;Manual techniques;Vasopneumatic Device    PT Next Visit Plan follow protocol           Patient will benefit from skilled therapeutic intervention in order to improve the following deficits and impairments:  Decreased range of motion,Increased muscle spasms,Impaired UE functional use,Postural dysfunction,Increased edema,Decreased strength,Impaired flexibility,Pain  Visit Diagnosis: Acute pain of right shoulder  Stiffness of right shoulder, not elsewhere classified  Localized edema     Problem List Patient Active Problem List   Diagnosis Date Noted  . 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 [place], [place], [address] Phone: [phone]   Fax:  [phone]  Name: Nicholas Terry MRN: [id number] Date of Birth: [birth year]

## 2020-07-31 ENCOUNTER — Other Ambulatory Visit: Payer: Self-pay

## 2020-07-31 ENCOUNTER — Ambulatory Visit: Payer: Medicare PPO | Admitting: Physical Therapy

## 2020-07-31 ENCOUNTER — Encounter: Payer: Self-pay | Admitting: Physical Therapy

## 2020-07-31 DIAGNOSIS — R600 Localized edema: Secondary | ICD-10-CM | POA: Diagnosis not present

## 2020-07-31 DIAGNOSIS — M25611 Stiffness of right shoulder, not elsewhere classified: Secondary | ICD-10-CM

## 2020-07-31 DIAGNOSIS — M25511 Pain in right shoulder: Secondary | ICD-10-CM

## 2020-07-31 DIAGNOSIS — M25612 Stiffness of left shoulder, not elsewhere classified: Secondary | ICD-10-CM | POA: Diagnosis not present

## 2020-07-31 NOTE — Therapy (Signed)
[place]. [place], [place], [address] Phone: [phone]   Fax:  [phone]  Physical Therapy Treatment  Patient Details  Name: Nicholas Terry MRN: [id number] Date of Birth: Jun 06, 1951 Referring Provider (PT): Supple   Encounter Date: 07/31/2020   PT End of Session - 07/31/20 1058    Visit Number 16    Date for PT Re-Evaluation 08/29/20    Authorization Type [employer]    PT Start Time 1015    PT Stop Time 1107    PT Time Calculation (min) 52 min    Activity Tolerance Patient tolerated treatment well    Behavior During Therapy [facility] for tasks assessed/performed           Past Medical History:  Diagnosis Date  . Arthritis   . BPH (benign prostatic hyperplasia)   . Complication of anesthesia   . DVT (deep venous thrombosis) (HCC)    bil calf  . ED (erectile dysfunction)   . Foot mass, left    left plantar forefoot  . History of colon polyps   . History of kidney stones   . Internal hemorrhoids   . Pneumonia    hx of   . PONV (postoperative nausea and vomiting)   . Seasonal allergies     Past Surgical History:  Procedure Laterality Date  . JOINT REPLACEMENT Left    knee  . KIDNEY STONE SURGERY     x3  . KNEE ARTHROSCOPY Bilateral   . MASS EXCISION Left 03/11/2018   Procedure: Excisional biopsy of left forefoot mass;  Surgeon: Wylene Simmer, MD;  Location: [place];  Service: Orthopedics;  Laterality: Left;  60 mins  . SHOULDER ARTHROSCOPY Right   . TOTAL SHOULDER ARTHROPLASTY Left 05/27/2018  . TOTAL SHOULDER ARTHROPLASTY Left 05/27/2018   Procedure: LEFT TOTAL SHOULDER ARTHROPLASTY;  Surgeon: Justice Britain, MD;  Location: [place];  Service: Orthopedics;  Laterality: Left;  149min  . TOTAL SHOULDER ARTHROPLASTY Right 05/17/2020   Procedure: TOTAL SHOULDER ARTHROPLASTY;  Surgeon: Justice Britain, MD;  [facility];  Service: Orthopedics;  Laterality: Right;  172min    There were  no vitals filed for this visit.   Subjective Assessment - 07/31/20 1017    Subjective Pretty good, some soreness yesterday from picking up sticks    Currently in Pain? No/denies                             [facility] Adult PT Treatment/Exercise - 07/31/20 0001      Shoulder Exercises: Standing   External Rotation Theraband;20 reps;Right    Theraband Level (Shoulder External Rotation) Level 2 (Red)    Internal Rotation Strengthening;Theraband;20 reps;Right    Theraband Level (Shoulder Internal Rotation) Level 2 (Red)    Extension Strengthening;Both;20 reps;Theraband    Theraband Level (Shoulder Extension) Level 4 (Blue)    Other Standing Exercises 3 level cabinet reaches flex & Abdx 10 2lb    Other Standing Exercises Flex & Abd up wall wiht pilow case x10      Shoulder Exercises: ROM/Strengthening   UBE (Upper Arm Bike) L1.6 x 3 min each    Other ROM/Strengthening Exercises Rows & lats 20lb 2x12      Vasopneumatic   Number Minutes Vasopneumatic  10 minutes    Vasopnuematic Location  Shoulder    Vasopneumatic Pressure Medium    Vasopneumatic Temperature  32      Manual  Therapy   Manual Therapy Passive ROM;Manual Traction;Joint mobilization    Manual therapy comments Shoulder girdle muscles are very tight    Joint Mobilization Grades 2-3    Passive ROM RUE in all directions    Manual Traction gentel distraction                    PT Short Term Goals - 06/07/20 1059      PT SHORT TERM GOAL #1   Title independent with initial HEP    Status Achieved             PT Long Term Goals - 06/07/20 1059      PT LONG TERM GOAL #1   Title understand and do RICE at home    Status Achieved                 Plan - 07/31/20 1058    Clinical Impression Statement Pt able to progress to resisted cabinet reaches.Overall his R shoulder is very tight limiting his ROM. Pain reported at end ranges with both active and passive movements. Tissue density noted  all around R shoulder more so in the pec area. RUE ER is the most limited.    Stability/Clinical Decision Making Evolving/Moderate complexity    Rehab Potential Good    PT Frequency 2x / week    PT Duration 6 weeks    PT Treatment/Interventions ADLs/Self Care Home Management;Cryotherapy;Electrical Stimulation;Neuromuscular re-education;Therapeutic exercise;Therapeutic activities;Patient/family education;Manual techniques;Vasopneumatic Device    PT Next Visit Plan follow protocol           Patient will benefit from skilled therapeutic intervention in order to improve the following deficits and impairments:  Decreased range of motion,Increased muscle spasms,Impaired UE functional use,Postural dysfunction,Increased edema,Decreased strength,Impaired flexibility,Pain  Visit Diagnosis: Stiffness of right shoulder, not elsewhere classified  Localized edema  Acute pain of right shoulder     Problem List Patient Active Problem List   Diagnosis Date Noted  . 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 [place], [place], [address] Phone: [phone]   Fax:  [phone]  Name: Nicholas Terry MRN: [id number] Date of Birth: October 03, 1950

## 2020-08-01 DIAGNOSIS — N390 Urinary tract infection, site not specified: Secondary | ICD-10-CM | POA: Diagnosis not present

## 2020-08-01 DIAGNOSIS — R350 Frequency of micturition: Secondary | ICD-10-CM | POA: Diagnosis not present

## 2020-08-02 ENCOUNTER — Encounter: Payer: Self-pay | Admitting: Physical Therapy

## 2020-08-02 ENCOUNTER — Other Ambulatory Visit: Payer: Self-pay

## 2020-08-02 ENCOUNTER — Ambulatory Visit: Payer: Medicare PPO | Admitting: Physical Therapy

## 2020-08-02 DIAGNOSIS — M25511 Pain in right shoulder: Secondary | ICD-10-CM

## 2020-08-02 DIAGNOSIS — R600 Localized edema: Secondary | ICD-10-CM | POA: Diagnosis not present

## 2020-08-02 DIAGNOSIS — M25611 Stiffness of right shoulder, not elsewhere classified: Secondary | ICD-10-CM | POA: Diagnosis not present

## 2020-08-02 DIAGNOSIS — M25612 Stiffness of left shoulder, not elsewhere classified: Secondary | ICD-10-CM | POA: Diagnosis not present

## 2020-08-02 NOTE — Therapy (Signed)
[place]. [place], [place], [address] Phone: [phone]   Fax:  [phone]  Physical Therapy Treatment  Patient Details  Name: Nicholas Terry MRN: [id number] Date of Birth: 1951-01-06 Referring Provider (PT): Supple   Encounter Date: 08/02/2020   PT End of Session - 08/02/20 1055    Visit Number 17    Number of Visits 24    Date for PT Re-Evaluation 08/29/20    Authorization Type [employer]    PT Start Time 6269    PT Stop Time 1104    PT Time Calculation (min) 49 min    Activity Tolerance Patient tolerated treatment well    Behavior During Therapy [facility] for tasks assessed/performed           Past Medical History:  Diagnosis Date  . Arthritis   . BPH (benign prostatic hyperplasia)   . Complication of anesthesia   . DVT (deep venous thrombosis) (HCC)    bil calf  . ED (erectile dysfunction)   . Foot mass, left    left plantar forefoot  . History of colon polyps   . History of kidney stones   . Internal hemorrhoids   . Pneumonia    hx of   . PONV (postoperative nausea and vomiting)   . Seasonal allergies     Past Surgical History:  Procedure Laterality Date  . JOINT REPLACEMENT Left    knee  . KIDNEY STONE SURGERY     x3  . KNEE ARTHROSCOPY Bilateral   . MASS EXCISION Left 03/11/2018   Procedure: Excisional biopsy of left forefoot mass;  Surgeon: Wylene Simmer, MD;  Location: [place];  Service: Orthopedics;  Laterality: Left;  60 mins  . SHOULDER ARTHROSCOPY Right   . TOTAL SHOULDER ARTHROPLASTY Left 05/27/2018  . TOTAL SHOULDER ARTHROPLASTY Left 05/27/2018   Procedure: LEFT TOTAL SHOULDER ARTHROPLASTY;  Surgeon: Justice Britain, MD;  Location: [place];  Service: Orthopedics;  Laterality: Left;  167min  . TOTAL SHOULDER ARTHROPLASTY Right 05/17/2020   Procedure: TOTAL SHOULDER ARTHROPLASTY;  Surgeon: Justice Britain, MD;  [facility];  Service: Orthopedics;  Laterality: Right;   152min    There were no vitals filed for this visit.   Subjective Assessment - 08/02/20 1018    Subjective "Pretty good, just tight, not hurting"    Currently in Pain? No/denies    Pain Location Shoulder    Pain Orientation Right    Pain Descriptors / Indicators Tightness              [facility] PT Assessment - 08/02/20 0001      AROM   Right Shoulder Flexion 130 Degrees    Right Shoulder ABduction 105 Degrees    Right Shoulder Internal Rotation 60 Degrees    Right Shoulder External Rotation 67 Degrees                         [facility] Adult PT Treatment/Exercise - 08/02/20 0001      Shoulder Exercises: Standing   External Rotation Theraband;20 reps;Right    Theraband Level (Shoulder External Rotation) Level 2 (Red)    Internal Rotation Strengthening;Theraband;20 reps;Right    Theraband Level (Shoulder Internal Rotation) Level 2 (Red)    Extension Strengthening;Both;Weights;20 reps    Extension Weight (lbs) 10    Other Standing Exercises 3 level cabinet reaches flex & Abdx 10 2lb      Shoulder Exercises: ROM/Strengthening  UBE (Upper Arm Bike) L1.6 x 3 min each    Other ROM/Strengthening Exercises Rows & lats 25lb 2x12    Other ROM/Strengthening Exercises chest press 15lb 2x15      Shoulder Exercises: Stretch   Other Shoulder Stretches Overhead  stretches on pull up bar 5x 10 sec      Vasopneumatic   Number Minutes Vasopneumatic  10 minutes    Vasopnuematic Location  Shoulder    Vasopneumatic Pressure Medium    Vasopneumatic Temperature  32      Manual Therapy   Manual Therapy Passive ROM;Manual Traction;Joint mobilization    Passive ROM RUE in all directions    Manual Traction gentel distraction                    PT Short Term Goals - 06/07/20 1059      PT SHORT TERM GOAL #1   Title independent with initial HEP    Status Achieved             PT Long Term Goals - 06/07/20 1059      PT LONG TERM GOAL #1   Title understand and do  RICE at home    Status Achieved                 Plan - 08/02/20 1055    Clinical Impression Statement Pt has progressed increasing his R shoulder AROM. Overall motion remains limited with firm end feel. Muscles round shoulder feel tight at end rang with stretching. Verbal cues needed for arm placement with external rotation. Passive ER remains most limites with passive motions.    Stability/Clinical Decision Making Evolving/Moderate complexity    Rehab Potential Good    PT Frequency 2x / week    PT Duration 6 weeks    PT Treatment/Interventions ADLs/Self Care Home Management;Cryotherapy;Electrical Stimulation;Neuromuscular re-education;Therapeutic exercise;Therapeutic activities;Patient/family education;Manual techniques;Vasopneumatic Device    PT Next Visit Plan follow protocol           Patient will benefit from skilled therapeutic intervention in order to improve the following deficits and impairments:  Decreased range of motion,Increased muscle spasms,Impaired UE functional use,Postural dysfunction,Increased edema,Decreased strength,Impaired flexibility,Pain  Visit Diagnosis: Stiffness of right shoulder, not elsewhere classified  Localized edema  Acute pain of right shoulder     Problem List Patient Active Problem List   Diagnosis Date Noted  . 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 [place], [place], [address] Phone: [phone]   Fax:  [phone]  Name: Nicholas Terry MRN: [id number] Date of Birth: 1951-05-03

## 2020-08-07 ENCOUNTER — Ambulatory Visit: Payer: Medicare PPO | Admitting: Physical Therapy

## 2020-08-08 DIAGNOSIS — M25552 Pain in left hip: Secondary | ICD-10-CM | POA: Diagnosis not present

## 2020-08-09 ENCOUNTER — Encounter: Payer: Self-pay | Admitting: Physical Therapy

## 2020-08-09 ENCOUNTER — Other Ambulatory Visit: Payer: Self-pay

## 2020-08-09 ENCOUNTER — Ambulatory Visit: Payer: Medicare PPO | Admitting: Physical Therapy

## 2020-08-09 DIAGNOSIS — M25511 Pain in right shoulder: Secondary | ICD-10-CM | POA: Diagnosis not present

## 2020-08-09 DIAGNOSIS — R600 Localized edema: Secondary | ICD-10-CM

## 2020-08-09 DIAGNOSIS — M25611 Stiffness of right shoulder, not elsewhere classified: Secondary | ICD-10-CM | POA: Diagnosis not present

## 2020-08-09 DIAGNOSIS — M25612 Stiffness of left shoulder, not elsewhere classified: Secondary | ICD-10-CM

## 2020-08-09 NOTE — Therapy (Signed)
[place]. [place], [place], [address] Phone: [phone]   Fax:  [phone]  Physical Therapy Treatment  Patient Details  Name: TONY GRANQUIST MRN: [id number] Date of Birth: 12-27-1950 Referring Provider (PT): Supple   Encounter Date: 08/09/2020   PT End of Session - 08/09/20 1057    Visit Number 18    Number of Visits 24    Date for PT Re-Evaluation 08/29/20    Authorization Type [employer]    PT Start Time 6415    PT Stop Time 1059    PT Time Calculation (min) 44 min    Activity Tolerance Patient tolerated treatment well    Behavior During Therapy [facility] for tasks assessed/performed           Past Medical History:  Diagnosis Date  . Arthritis   . BPH (benign prostatic hyperplasia)   . Complication of anesthesia   . DVT (deep venous thrombosis) (HCC)    bil calf  . ED (erectile dysfunction)   . Foot mass, left    left plantar forefoot  . History of colon polyps   . History of kidney stones   . Internal hemorrhoids   . Pneumonia    hx of   . PONV (postoperative nausea and vomiting)   . Seasonal allergies     Past Surgical History:  Procedure Laterality Date  . JOINT REPLACEMENT Left    knee  . KIDNEY STONE SURGERY     x3  . KNEE ARTHROSCOPY Bilateral   . MASS EXCISION Left 03/11/2018   Procedure: Excisional biopsy of left forefoot mass;  Surgeon: Wylene Simmer, MD;  Location: [place];  Service: Orthopedics;  Laterality: Left;  60 mins  . SHOULDER ARTHROSCOPY Right   . TOTAL SHOULDER ARTHROPLASTY Left 05/27/2018  . TOTAL SHOULDER ARTHROPLASTY Left 05/27/2018   Procedure: LEFT TOTAL SHOULDER ARTHROPLASTY;  Surgeon: Justice Britain, MD;  Location: [place];  Service: Orthopedics;  Laterality: Left;  135mn  . TOTAL SHOULDER ARTHROPLASTY Right 05/17/2020   Procedure: TOTAL SHOULDER ARTHROPLASTY;  Surgeon: SJustice Britain MD;  [facility];  Service: Orthopedics;  Laterality: Right;   1278m    There were no vitals filed for this visit.   Subjective Assessment - 08/09/20 1013    Subjective "Good, except for my hip, did get a shot yesterday, in my hip"    Currently in Pain? No/denies              [facility] Assessment - 08/09/20 0001      AROM   Right Shoulder Flexion 130 Degrees    Right Shoulder ABduction 105 Degrees    Right Shoulder Internal Rotation 60 Degrees    Right Shoulder External Rotation 67 Degrees                         [facility] Adult PT Treatment/Exercise - 08/09/20 0001      Shoulder Exercises: Standing   External Rotation Theraband;20 reps;Right    Theraband Level (Shoulder External Rotation) Level 2 (Red)    Internal Rotation Strengthening;Theraband;Right;15 reps   x2   Theraband Level (Shoulder Internal Rotation) Level 2 (Red)    Flexion Strengthening;Weights;Both;15 reps    Shoulder Flexion Weight (lbs) 2    ABduction Strengthening;Weights;20 reps;Both    Shoulder ABduction Weight (lbs) 2      Shoulder Exercises: ROM/Strengthening   UBE (Upper Arm Bike) L1.7  x 3 min each  Other ROM/Strengthening Exercises Rows & lats 25lb 2x12    Other ROM/Strengthening Exercises chest press 15lb 2x15      Manual Therapy   Manual Therapy Passive ROM;Manual Traction;Joint mobilization    Joint Mobilization Grades 3    Passive ROM RUE in all directions    Manual Traction GH distraction                    PT Short Term Goals - 06/07/20 1059      PT SHORT TERM GOAL #1   Title independent with initial HEP    Status Achieved             PT Long Term Goals - 08/09/20 1058      PT LONG TERM GOAL #1   Title understand and do RICE at home    Status Achieved      PT LONG TERM GOAL #2   Title dress without difficulty    Status Partially Met      PT LONG TERM GOAL #3   Title increase AROM to 130 degrees flexion    Status Partially Met      PT LONG TERM GOAL #4   Title reach into cabinet with 3#    Status  On-going                 Plan - 08/09/20 1058    Clinical Impression Statement Progressing towards goals, good effort throughout session. Some shoulder elevation on both sides with standing flexion and abduction. He reports better overall mobility. He did better today tolerating MT less pain at end range allowing greater stretching.    Stability/Clinical Decision Making Evolving/Moderate complexity    Rehab Potential Good    PT Frequency 2x / week    PT Duration 6 weeks    PT Treatment/Interventions ADLs/Self Care Home Management;Cryotherapy;Electrical Stimulation;Neuromuscular re-education;Therapeutic exercise;Therapeutic activities;Patient/family education;Manual techniques;Vasopneumatic Device    PT Next Visit Plan follow protocol           Patient will benefit from skilled therapeutic intervention in order to improve the following deficits and impairments:  Decreased range of motion,Increased muscle spasms,Impaired UE functional use,Postural dysfunction,Increased edema,Decreased strength,Impaired flexibility,Pain  Visit Diagnosis: Localized edema  Stiffness of right shoulder, not elsewhere classified  Acute pain of right shoulder  Stiffness of left shoulder, not elsewhere classified     Problem List Patient Active Problem List   Diagnosis Date Noted  . 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 [place], [place], [address] Phone: [phone]   Fax:  [phone]  Name: CHANCELOR HARDRICK MRN: [id number] Date of Birth: [birth year]

## 2020-08-13 DIAGNOSIS — Z96611 Presence of right artificial shoulder joint: Secondary | ICD-10-CM | POA: Diagnosis not present

## 2020-08-13 DIAGNOSIS — Z471 Aftercare following joint replacement surgery: Secondary | ICD-10-CM | POA: Diagnosis not present

## 2020-08-14 ENCOUNTER — Other Ambulatory Visit: Payer: Self-pay

## 2020-08-14 ENCOUNTER — Ambulatory Visit: Payer: Medicare PPO | Admitting: Physical Therapy

## 2020-08-14 ENCOUNTER — Encounter: Payer: Self-pay | Admitting: Physical Therapy

## 2020-08-14 ENCOUNTER — Encounter: Payer: Medicare PPO | Admitting: Physical Therapy

## 2020-08-14 DIAGNOSIS — M25612 Stiffness of left shoulder, not elsewhere classified: Secondary | ICD-10-CM | POA: Diagnosis not present

## 2020-08-14 DIAGNOSIS — M25611 Stiffness of right shoulder, not elsewhere classified: Secondary | ICD-10-CM | POA: Diagnosis not present

## 2020-08-14 DIAGNOSIS — R600 Localized edema: Secondary | ICD-10-CM | POA: Diagnosis not present

## 2020-08-14 DIAGNOSIS — M25511 Pain in right shoulder: Secondary | ICD-10-CM | POA: Diagnosis not present

## 2020-08-14 NOTE — Therapy (Signed)
[place]. [place], [place], [address] Phone: [phone]   Fax:  [phone]  Physical Therapy Treatment  Patient Details  Name: Nicholas Terry MRN: [id number] Date of Birth: 07-07-1951 Referring Provider (PT): Supple   Encounter Date: 08/14/2020   PT End of Session - 08/14/20 1007    Visit Number 19    Number of Visits 24    Date for PT Re-Evaluation 08/29/20    Authorization Type [employer]    PT Start Time 0927    PT Stop Time 1020    PT Time Calculation (min) 53 min    Activity Tolerance Patient tolerated treatment well    Behavior During Therapy [facility] for tasks assessed/performed           Past Medical History:  Diagnosis Date  . Arthritis   . BPH (benign prostatic hyperplasia)   . Complication of anesthesia   . DVT (deep venous thrombosis) (HCC)    bil calf  . ED (erectile dysfunction)   . Foot mass, left    left plantar forefoot  . History of colon polyps   . History of kidney stones   . Internal hemorrhoids   . Pneumonia    hx of   . PONV (postoperative nausea and vomiting)   . Seasonal allergies     Past Surgical History:  Procedure Laterality Date  . JOINT REPLACEMENT Left    knee  . KIDNEY STONE SURGERY     x3  . KNEE ARTHROSCOPY Bilateral   . MASS EXCISION Left 03/11/2018   Procedure: Excisional biopsy of left forefoot mass;  Surgeon: Wylene Simmer, MD;  Location: [facility];  Service: Orthopedics;  Laterality: Left;  60 mins  . SHOULDER ARTHROSCOPY Right   . TOTAL SHOULDER ARTHROPLASTY Left 05/27/2018  . TOTAL SHOULDER ARTHROPLASTY Left 05/27/2018   Procedure: LEFT TOTAL SHOULDER ARTHROPLASTY;  Surgeon: Justice Britain, MD;  Location: [facility];  Service: Orthopedics;  Laterality: Left;  160mn  . TOTAL SHOULDER ARTHROPLASTY Right 05/17/2020   Procedure: TOTAL SHOULDER ARTHROPLASTY;  Surgeon: SJustice Britain MD;  [facility];  Service: Orthopedics;  Laterality: Right;   1228m    There were no vitals filed for this visit.   Subjective Assessment - 08/14/20 0930    Subjective Feeling goo, MD was pleased, would like some more external rotation    Currently in Pain? No/denies                             [facility]/Exercise - 08/14/20 0001      Shoulder Exercises: Standing   External Rotation Theraband;20 reps;Right    Theraband Level (Shoulder External Rotation) Level 2 (Red)    Internal Rotation Strengthening;Theraband;Right;20 reps    Theraband Level (Shoulder Internal Rotation) Level 2 (Red)    Other Standing Exercises Standing flex, Ext, IR up back x15      Shoulder Exercises: ROM/Strengthening   Nustep L5 UE only x 4 min    Other ROM/Strengthening Exercises Rows & lats 35lb 2x12    Other ROM/Strengthening Exercises chest press 20lb 2x12      Vasopneumatic   Number Minutes Vasopneumatic  10 minutes    Vasopnuematic Location  Shoulder    Vasopneumatic Pressure Medium    Vasopneumatic Temperature  32      Manual Therapy   Manual Therapy Passive ROM;Manual Traction;Joint mobilization    Joint Mobilization Grades 3  Passive ROM RUE in all directions    Manual Traction GH distraction                    PT Short Term Goals - 06/07/20 1059      PT SHORT TERM GOAL #1   Title independent with initial HEP    Status Achieved             PT Long Term Goals - 08/09/20 1058      PT LONG TERM GOAL #1   Title understand and do RICE at home    Status Achieved      PT LONG TERM GOAL #2   Title dress without difficulty    Status Partially Met      PT LONG TERM GOAL #3   Title increase AROM to 130 degrees flexion    Status Partially Met      PT LONG TERM GOAL #4   Title reach into cabinet with 3#    Status On-going                 Plan - 08/14/20 1008    Clinical Impression Statement Pt stated MD was pleased with progress but would like more R shoulder ER. increase resistance  tolerated with seated rows and lats. Some fatigue noted with NuStep warm up. Tactile cues for arm placement with ER/IR. Cues to relax with MT. Some pain at the end range of MT, again ER remains most limited.    Stability/Clinical Decision Making Evolving/Moderate complexity    Rehab Potential Good    PT Frequency 2x / week    PT Duration 6 weeks    PT Treatment/Interventions ADLs/Self Care Home Management;Cryotherapy;Electrical Stimulation;Neuromuscular re-education;Therapeutic exercise;Therapeutic activities;Patient/family education;Manual techniques;Vasopneumatic Device    PT Next Visit Plan follow protocol           Patient will benefit from skilled therapeutic intervention in order to improve the following deficits and impairments:  Decreased range of motion,Increased muscle spasms,Impaired UE functional use,Postural dysfunction,Increased edema,Decreased strength,Impaired flexibility,Pain  Visit Diagnosis: Localized edema  Stiffness of right shoulder, not elsewhere classified  Acute pain of right shoulder     Problem List Patient Active Problem List   Diagnosis Date Noted  . 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 [place], [place], [address] Phone: [phone]   Fax:  [phone]  Name: Nicholas Terry MRN: [id number] Date of Birth: 09-30-1950

## 2020-08-16 ENCOUNTER — Other Ambulatory Visit: Payer: Self-pay

## 2020-08-16 ENCOUNTER — Ambulatory Visit: Payer: Medicare PPO | Admitting: Physical Therapy

## 2020-08-16 ENCOUNTER — Encounter: Payer: Self-pay | Admitting: Physical Therapy

## 2020-08-16 DIAGNOSIS — R600 Localized edema: Secondary | ICD-10-CM | POA: Diagnosis not present

## 2020-08-16 DIAGNOSIS — M25511 Pain in right shoulder: Secondary | ICD-10-CM

## 2020-08-16 DIAGNOSIS — M25611 Stiffness of right shoulder, not elsewhere classified: Secondary | ICD-10-CM

## 2020-08-16 DIAGNOSIS — M25612 Stiffness of left shoulder, not elsewhere classified: Secondary | ICD-10-CM | POA: Diagnosis not present

## 2020-08-16 NOTE — Therapy (Signed)
[place]. [place], [place], [address] Phone: [phone]   Fax:  [phone] Progress Note Reporting Period 07/10/20 to 08/16/20 for visit 11-20 See note below for Objective Data and Assessment of Progress/Goals.      Physical Therapy Treatment  Patient Details  Name: Nicholas Terry MRN: [id number] Date of Birth: 09/08/1950 Referring Provider (PT): Supple   Encounter Date: 08/16/2020   PT End of Session - 08/16/20 1055    Visit Number 20    Date for PT Re-Evaluation 08/29/20    Authorization Type [employer]    PT Start Time 1423    PT Stop Time 1100    PT Time Calculation (min) 45 min    Activity Tolerance Patient tolerated treatment well    Behavior During Therapy [facility] for tasks assessed/performed           Past Medical History:  Diagnosis Date  . Arthritis   . BPH (benign prostatic hyperplasia)   . Complication of anesthesia   . DVT (deep venous thrombosis) (HCC)    bil calf  . ED (erectile dysfunction)   . Foot mass, left    left plantar forefoot  . History of colon polyps   . History of kidney stones   . Internal hemorrhoids   . Pneumonia    hx of   . PONV (postoperative nausea and vomiting)   . Seasonal allergies     Past Surgical History:  Procedure Laterality Date  . JOINT REPLACEMENT Left    knee  . KIDNEY STONE SURGERY     x3  . KNEE ARTHROSCOPY Bilateral   . MASS EXCISION Left 03/11/2018   Procedure: Excisional biopsy of left forefoot mass;  Surgeon: Wylene Simmer, MD;  Location: [facility];  Service: Orthopedics;  Laterality: Left;  60 mins  . SHOULDER ARTHROSCOPY Right   . TOTAL SHOULDER ARTHROPLASTY Left 05/27/2018  . TOTAL SHOULDER ARTHROPLASTY Left 05/27/2018   Procedure: LEFT TOTAL SHOULDER ARTHROPLASTY;  Surgeon: Justice Britain, MD;  Location: [facility];  Service: Orthopedics;  Laterality: Left;  167mn  . TOTAL SHOULDER ARTHROPLASTY Right 05/17/2020   Procedure:  TOTAL SHOULDER ARTHROPLASTY;  Surgeon: SJustice Britain MD;  [facility];  Service: Orthopedics;  Laterality: Right;  1245m    There were no vitals filed for this visit.   Subjective Assessment - 08/16/20 1021    Subjective "Im good" Went to gym did some leg exercises.    Currently in Pain? No/denies                             [facility]ult PT Treatment/Exercise - 08/16/20 0001      Shoulder Exercises: Standing   External Rotation Theraband;20 reps;Right   abd to 90 deg   Theraband Level (Shoulder External Rotation) Level 1 (Yellow)    Internal Rotation Strengthening;Right;20 reps;Weights    Internal Rotation Weight (lbs) 5    Extension Strengthening;Both;Weights;20 reps    Extension Weight (lbs) 15    Other Standing Exercises Standing flex, Ext, IR up back x15    Other Standing Exercises 3 level cabinet reaches 3lb flex and abd  x 15 each; OHP 2lb bar, Tricep ext 35lb 2x15, Bicep curls 25lb 2x10      Shoulder Exercises: ROM/Strengthening   UBE (Upper Arm Bike) L1.7  x 3 min each    Other ROM/Strengthening Exercises Rows & lats 35lb 2x12    Other  ROM/Strengthening Exercises chest press 20lb 2x12      Shoulder Exercises: Stretch   Other Shoulder Stretches Overhead  stretches on pull up bar 5x 10 sec                    PT Short Term Goals - 06/07/20 1059      PT SHORT TERM GOAL #1   Title independent with initial HEP    Status Achieved             PT Long Term Goals - 08/16/20 1056      PT LONG TERM GOAL #1   Title understand and do RICE at home    Status Achieved      PT LONG TERM GOAL #2   Title dress without difficulty    Status Achieved      PT LONG TERM GOAL #3   Title increase AROM to 130 degrees flexion    Status Partially Met                 Plan - 08/16/20 1057    Clinical Impression Statement Patient did well today. increase resistance tolerated well. Some RUE compensation noted with 3 level cabinet reaches.  Tactile cues needed for ER with RUE abducted to 90 degrees.    Stability/Clinical Decision Making Evolving/Moderate complexity    Rehab Potential Good    PT Frequency 2x / week    PT Duration 6 weeks    PT Treatment/Interventions ADLs/Self Care Home Management;Cryotherapy;Electrical Stimulation;Neuromuscular re-education;Therapeutic exercise;Therapeutic activities;Patient/family education;Manual techniques;Vasopneumatic Device    PT Next Visit Plan follow protocol           Patient will benefit from skilled therapeutic intervention in order to improve the following deficits and impairments:  Decreased range of motion,Increased muscle spasms,Impaired UE functional use,Postural dysfunction,Increased edema,Decreased strength,Impaired flexibility,Pain  Visit Diagnosis: Localized edema  Stiffness of right shoulder, not elsewhere classified  Acute pain of right shoulder     Problem List Patient Active Problem List   Diagnosis Date Noted  . 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 [place], [place], [address] Phone: [phone]   Fax:  [phone]  Name: Nicholas Terry MRN: [id number] Date of Birth: 1951/03/08

## 2020-08-21 ENCOUNTER — Ambulatory Visit: Payer: Medicare PPO | Attending: Orthopedic Surgery | Admitting: Physical Therapy

## 2020-08-21 ENCOUNTER — Encounter: Payer: Self-pay | Admitting: Physical Therapy

## 2020-08-21 ENCOUNTER — Other Ambulatory Visit: Payer: Self-pay

## 2020-08-21 DIAGNOSIS — M25612 Stiffness of left shoulder, not elsewhere classified: Secondary | ICD-10-CM | POA: Diagnosis not present

## 2020-08-21 DIAGNOSIS — R600 Localized edema: Secondary | ICD-10-CM

## 2020-08-21 DIAGNOSIS — M25511 Pain in right shoulder: Secondary | ICD-10-CM | POA: Diagnosis not present

## 2020-08-21 DIAGNOSIS — M25512 Pain in left shoulder: Secondary | ICD-10-CM | POA: Diagnosis not present

## 2020-08-21 DIAGNOSIS — M25611 Stiffness of right shoulder, not elsewhere classified: Secondary | ICD-10-CM | POA: Diagnosis not present

## 2020-08-21 NOTE — Therapy (Signed)
[place]. [place], [place], [address] Phone: [phone]   Fax:  [phone]  Physical Therapy Treatment  Patient Details  Name: JEFFREN DOMBEK MRN: [id number] Date of Birth: [birth year] Referring Provider (PT): Supple   Encounter Date: 08/21/2020   PT End of Session - 08/21/20 1054    Visit Number 21    Number of Visits 24    Date for PT Re-Evaluation 08/29/20    Authorization Type [employer]    PT Start Time 6644    PT Stop Time 1100    PT Time Calculation (min) 45 min    Activity Tolerance Patient tolerated treatment well    Behavior During Therapy [facility] for tasks assessed/performed           Past Medical History:  Diagnosis Date  . Arthritis   . BPH (benign prostatic hyperplasia)   . Complication of anesthesia   . DVT (deep venous thrombosis) (HCC)    bil calf  . ED (erectile dysfunction)   . Foot mass, left    left plantar forefoot  . History of colon polyps   . History of kidney stones   . Internal hemorrhoids   . Pneumonia    hx of   . PONV (postoperative nausea and vomiting)   . Seasonal allergies     Past Surgical History:  Procedure Laterality Date  . JOINT REPLACEMENT Left    knee  . KIDNEY STONE SURGERY     x3  . KNEE ARTHROSCOPY Bilateral   . MASS EXCISION Left 03/11/2018   Procedure: Excisional biopsy of left forefoot mass;  Surgeon: Wylene Simmer, MD;  Location: [facility];  Service: Orthopedics;  Laterality: Left;  60 mins  . SHOULDER ARTHROSCOPY Right   . TOTAL SHOULDER ARTHROPLASTY Left 05/27/2018  . TOTAL SHOULDER ARTHROPLASTY Left 05/27/2018   Procedure: LEFT TOTAL SHOULDER ARTHROPLASTY;  Surgeon: Justice Britain, MD;  Location: [facility];  Service: Orthopedics;  Laterality: Left;  136mn  . TOTAL SHOULDER ARTHROPLASTY Right 05/17/2020   Procedure: TOTAL SHOULDER ARTHROPLASTY;  Surgeon: SJustice Britain MD;  [facility];  Service: Orthopedics;  Laterality: Right;   1254m    There were no vitals filed for this visit.   Subjective Assessment - 08/21/20 1015    Subjective "Doing good"    Currently in Pain? No/denies                             [facility]/Exercise - 08/21/20 0001      Shoulder Exercises: Standing   External Rotation Theraband;20 reps;Right    Theraband Level (Shoulder External Rotation) Level 1 (Yellow)    Internal Rotation Strengthening;Right;20 reps;Weights    Internal Rotation Weight (lbs) 5    Flexion Strengthening;Weights;Both;15 reps    Shoulder Flexion Weight (lbs) 3    ABduction Strengthening;Weights;20 reps;Both    Shoulder ABduction Weight (lbs) 3    Extension Strengthening;Both;Weights;20 reps    Extension Weight (lbs) 15    Other Standing Exercises 3 level cabinet reaches 3lb flex and abd  x 12 each; Tricep ext 35lb 2x15, Bicep curls 25lb 2x10      Shoulder Exercises: ROM/Strengthening   UBE (Upper Arm Bike) L1.7  x 2 min each    Nustep L5 UE only x 4 min    Other ROM/Strengthening Exercises Rows & lats 35lb 2x12    Other ROM/Strengthening Exercises chest press 20lb 2x12  PT Short Term Goals - 06/07/20 1059      PT SHORT TERM GOAL #1   Title independent with initial HEP    Status Achieved             PT Long Term Goals - 08/16/20 1056      PT LONG TERM GOAL #1   Title understand and do RICE at home    Status Achieved      PT LONG TERM GOAL #2   Title dress without difficulty    Status Achieved      PT LONG TERM GOAL #3   Title increase AROM to 130 degrees flexion    Status Partially Met                 Plan - 08/21/20 1054    Clinical Impression Statement Pt continues to do well overall with decrease pain and increase mobility. He has returned to his normal routine at home. Weakness remains with external and internal rotation. He does fatigue quick with R shoulder flexion and abduction.    Stability/Clinical Decision Making  Evolving/Moderate complexity    Rehab Potential Good    PT Frequency 2x / week    PT Duration 6 weeks    PT Treatment/Interventions ADLs/Self Care Home Management;Cryotherapy;Electrical Stimulation;Neuromuscular re-education;Therapeutic exercise;Therapeutic activities;Patient/family education;Manual techniques;Vasopneumatic Device    PT Next Visit Plan follow protocol           Patient will benefit from skilled therapeutic intervention in order to improve the following deficits and impairments:  Decreased range of motion,Increased muscle spasms,Impaired UE functional use,Postural dysfunction,Increased edema,Decreased strength,Impaired flexibility,Pain  Visit Diagnosis: Stiffness of right shoulder, not elsewhere classified  Acute pain of right shoulder  Localized edema  Stiffness of left shoulder, not elsewhere classified  Acute pain of left shoulder     Problem List Patient Active Problem List   Diagnosis Date Noted  . 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 [place], [place], [address] Phone: [phone]   Fax:  [phone]  Name: BRIGGS EDELEN MRN: [id number] Date of Birth: [birth year]

## 2020-08-23 ENCOUNTER — Ambulatory Visit: Payer: Medicare PPO | Admitting: Physical Therapy

## 2020-08-28 ENCOUNTER — Ambulatory Visit: Payer: Medicare PPO | Admitting: Physical Therapy

## 2020-08-28 ENCOUNTER — Encounter: Payer: Self-pay | Admitting: Physical Therapy

## 2020-08-28 ENCOUNTER — Other Ambulatory Visit: Payer: Self-pay

## 2020-08-28 DIAGNOSIS — M25511 Pain in right shoulder: Secondary | ICD-10-CM

## 2020-08-28 DIAGNOSIS — M25512 Pain in left shoulder: Secondary | ICD-10-CM | POA: Diagnosis not present

## 2020-08-28 DIAGNOSIS — M25611 Stiffness of right shoulder, not elsewhere classified: Secondary | ICD-10-CM

## 2020-08-28 DIAGNOSIS — R600 Localized edema: Secondary | ICD-10-CM

## 2020-08-28 DIAGNOSIS — M25612 Stiffness of left shoulder, not elsewhere classified: Secondary | ICD-10-CM | POA: Diagnosis not present

## 2020-08-28 NOTE — Therapy (Signed)
[facility] [address] [place], [place], [address] Phone: [phone]   Fax:  [phone]  Physical Therapy Treatment  Patient Details  Name: Nicholas Terry MRN: [id number] Date of Birth: July 18, 1951 Referring Provider (PT): Supple   Encounter Date: 08/28/2020   PT End of Session - 08/28/20 1056    Visit Number 22    Number of Visits 24    Date for PT Re-Evaluation 08/29/20    Authorization Type [employer]    PT Start Time 1015    PT Stop Time 1058    PT Time Calculation (min) 43 min    Activity Tolerance Patient tolerated treatment well    Behavior During Therapy [facility] for tasks assessed/performed           Past Medical History:  Diagnosis Date  . Arthritis   . BPH (benign prostatic hyperplasia)   . Complication of anesthesia   . DVT (deep venous thrombosis) (HCC)    bil calf  . ED (erectile dysfunction)   . Foot mass, left    left plantar forefoot  . History of colon polyps   . History of kidney stones   . Internal hemorrhoids   . Pneumonia    hx of   . PONV (postoperative nausea and vomiting)   . Seasonal allergies     Past Surgical History:  Procedure Laterality Date  . JOINT REPLACEMENT Left    knee  . KIDNEY STONE SURGERY     x3  . KNEE ARTHROSCOPY Bilateral   . MASS EXCISION Left 03/11/2018   Procedure: Excisional biopsy of left forefoot mass;  Surgeon: Toni Arthurs, MD;  Location: [facility];  Service: Orthopedics;  Laterality: Left;  60 mins  . SHOULDER ARTHROSCOPY Right   . TOTAL SHOULDER ARTHROPLASTY Left 05/27/2018  . TOTAL SHOULDER ARTHROPLASTY Left 05/27/2018   Procedure: LEFT TOTAL SHOULDER ARTHROPLASTY;  Surgeon: Francena Hanly, MD;  Location: [facility];  Service: Orthopedics;  Laterality: Left;   . TOTAL SHOULDER ARTHROPLASTY Right 05/17/2020   Procedure: TOTAL SHOULDER ARTHROPLASTY;  Surgeon: Francena Hanly, MD;  Location: [facility];  Service: Orthopedics;  Laterality: Right;      There were no vitals filed for this visit.   Subjective Assessment - 08/28/20 1014    Subjective "Im good,"    Currently in Pain? No/denies              [facility] PT Assessment - 08/28/20 0001      AROM   Right Shoulder Flexion 134 Degrees    Right Shoulder ABduction 113 Degrees    Right Shoulder Internal Rotation 63 Degrees    Right Shoulder External Rotation 73 Degrees                         [facility] Adult PT Treatment/Exercise - 08/28/20 0001      Shoulder Exercises: Standing   External Rotation Theraband;20 reps;Right    Theraband Level (Shoulder External Rotation) Level 2 (Red)    Flexion Strengthening;Weights;Both;15 reps    Shoulder Flexion Weight (lbs) 3    ABduction Strengthening;Weights;20 reps;Both    Shoulder ABduction Weight (lbs) 3    Extension Strengthening;Both;Weights;20 reps    Extension Weight (lbs) 15    Other Standing Exercises Biceps Curls 10lb 2x15, 5lb rear delt pulley 2x10    Other Standing Exercises Triceps Ext 25lb 2x15      Shoulder Exercises: ROM/Strengthening   UBE (Upper Arm Bike)  L1.7  x 2 min each    Nustep L3 UE only x 4 min    Other ROM/Strengthening Exercises Rows & lats 35lb 2x15    Other ROM/Strengthening Exercises chest press 20lb 2x15      Shoulder Exercises: Stretch   Other Shoulder Stretches Overhead  stretches on pull up bar 5x 10 sec                    PT Short Term Goals - 06/07/20 1059      PT SHORT TERM GOAL #1   Title independent with initial HEP    Status Achieved             PT Long Term Goals - 08/28/20 1104      PT LONG TERM GOAL #4   Title reach into cabinet with 3#    Status Achieved                 Plan - 08/28/20 1056    Clinical Impression Statement Pt has progressed meeting all of his goals. He reports being able to do his yardwork without issus. He reports being pleased with is current functional stated, and will contnues his care on his own at the [facility]"     Stability/Clinical Decision Making Evolving/Moderate complexity    Rehab Potential Good    PT Frequency 2x / week    PT Treatment/Interventions ADLs/Self Care Home Management;Cryotherapy;Electrical Stimulation;Neuromuscular re-education;Therapeutic exercise;Therapeutic activities;Patient/family education;Manual techniques;Vasopneumatic Device    PT Next Visit Plan follow protocol           Patient will benefit from skilled therapeutic intervention in order to improve the following deficits and impairments:  Decreased range of motion,Increased muscle spasms,Impaired UE functional use,Postural dysfunction,Increased edema,Decreased strength,Impaired flexibility,Pain  Visit Diagnosis: Stiffness of right shoulder, not elsewhere classified  Acute pain of right shoulder  Localized edema     Problem List Patient Active Problem List   Diagnosis Date Noted  . 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  Patient goals were met. Patient is being discharged due to meeting the stated rehab goals.  ?????       [name], PTA 08/28/2020, 11:06 AM  [place]. [place], [place], [address] Phone: [phone]   Fax:  [phone]  Name: Nicholas Terry MRN: [id number] Date of Birth: Nov 09, 1950

## 2020-08-30 ENCOUNTER — Ambulatory Visit: Payer: Medicare PPO | Admitting: Physical Therapy

## 2020-09-12 ENCOUNTER — Other Ambulatory Visit: Payer: Self-pay

## 2020-09-12 ENCOUNTER — Ambulatory Visit (AMBULATORY_SURGERY_CENTER): Payer: Self-pay | Admitting: *Deleted

## 2020-09-12 VITALS — Ht 69.0 in | Wt 143.0 lb

## 2020-09-12 DIAGNOSIS — Z860100 Personal history of colon polyps, unspecified: Secondary | ICD-10-CM

## 2020-09-12 MED ORDER — NA SULFATE-K SULFATE-MG SULF 17.5-3.13-1.6 GM/177ML PO SOLN: 1.0000 | Freq: Once | ORAL | 0 refills | Status: AC

## 2020-09-12 NOTE — Progress Notes (Signed)
No egg or soy allergy known to patient  No issues with past sedation with any surgeries or procedures No intubation problems in the past  No FH of Malignant Hyperthermia No diet pills per patient No home 02 use per patient  No blood thinners per patient  Pt denies issues with constipation  No A fib or A flutter  EMMI video to pt or via [place] 19 guidelines implemented in PV today with Pt and RN  Pt is fully vaccinated  for Covid  Pt denies loose or missing teeth, denies dentures, partials, dental implants, capped or bonded teethCoupon given to pt in PV today , Code to Pharmacy and  NO PA's for preps discussed with pt In PV today  Discussed with pt there will be an out-of-pocket cost for prep and that varies from $0 to 70 dollars   Due to the COVID-19 pandemic we are asking patients to follow certain guidelines.  Pt aware of COVID protocols and [facility] guidelines

## 2020-09-13 ENCOUNTER — Encounter: Payer: Self-pay | Admitting: Internal Medicine

## 2020-09-19 DIAGNOSIS — M1612 Unilateral primary osteoarthritis, left hip: Secondary | ICD-10-CM | POA: Diagnosis not present

## 2020-09-26 ENCOUNTER — Other Ambulatory Visit: Payer: Self-pay

## 2020-09-26 ENCOUNTER — Encounter: Payer: Self-pay | Admitting: Internal Medicine

## 2020-09-26 ENCOUNTER — Ambulatory Visit (AMBULATORY_SURGERY_CENTER): Payer: Medicare PPO | Admitting: Internal Medicine

## 2020-09-26 VITALS — BP 107/68 | HR 78 | Temp 98.2°F | Resp 14 | Ht 69.0 in | Wt 143.0 lb

## 2020-09-26 DIAGNOSIS — Z86010 Personal history of colonic polyps: Secondary | ICD-10-CM | POA: Diagnosis not present

## 2020-09-26 DIAGNOSIS — D120 Benign neoplasm of cecum: Secondary | ICD-10-CM | POA: Diagnosis not present

## 2020-09-26 DIAGNOSIS — Z1211 Encounter for screening for malignant neoplasm of colon: Secondary | ICD-10-CM | POA: Diagnosis not present

## 2020-09-26 MED ORDER — SODIUM CHLORIDE 0.9 % IV SOLN: 500.0000 mL | Freq: Once | INTRAVENOUS | Status: DC

## 2020-09-26 NOTE — Progress Notes (Signed)
Pt's states no medical or surgical changes since previsit or office visit.  [place] - vitals 

## 2020-09-26 NOTE — Patient Instructions (Signed)
Handouts on hemorrhoids, polyps, and hemorrhoidal banding given to you today  Await pathology results  Resume Xarelto today    YOU HAD AN ENDOSCOPIC PROCEDURE TODAY AT [facility]:   Refer to the procedure report that was given to you for any specific questions about what was found during the examination.  If the procedure report does not answer your questions, please call your gastroenterologist to clarify.  If you requested that your care partner not be given the details of your procedure findings, then the procedure report has been included in a sealed envelope for you to review at your convenience later.  YOU SHOULD EXPECT: Some feelings of bloating in the abdomen. Passage of more gas than usual.  Walking can help get rid of the air that was put into your GI tract during the procedure and reduce the bloating. If you had a lower endoscopy (such as a colonoscopy or flexible sigmoidoscopy) you may notice spotting of blood in your stool or on the toilet paper. If you underwent a bowel prep for your procedure, you may not have a normal bowel movement for a few days.  Please Note:  You might notice some irritation and congestion in your nose or some drainage.  This is from the oxygen used during your procedure.  There is no need for concern and it should clear up in a day or so.  SYMPTOMS TO REPORT IMMEDIATELY:   Following lower endoscopy (colonoscopy or flexible sigmoidoscopy):  Excessive amounts of blood in the stool  Significant tenderness or worsening of abdominal pains  Swelling of the abdomen that is new, acute  Fever of 100F or higher  For urgent or emergent issues, a gastroenterologist can be reached at any hour by calling [phone]. Do not use MyChart messaging for urgent concerns.    DIET:  We do recommend a small meal at first, but then you may proceed to your regular diet.  Drink plenty of fluids but you should avoid alcoholic beverages for 24  hours.  ACTIVITY:  You should plan to take it easy for the rest of today and you should NOT DRIVE or use heavy machinery until tomorrow (because of the sedation medicines used during the test).    FOLLOW UP: Our staff will call the number listed on your records 48-72 hours following your procedure to check on you and address any questions or concerns that you may have regarding the information given to you following your procedure. If we do not reach you, we will leave a message.  We will attempt to reach you two times.  During this call, we will ask if you have developed any symptoms of COVID 19. If you develop any symptoms (ie: fever, flu-like symptoms, shortness of breath, cough etc.) before then, please call [phone].  If you test positive for Covid 19 in the 2 weeks post procedure, please call and report this information to [place].    If any biopsies were taken you will be contacted by phone or by letter within the next 1-3 weeks.  Please call us at [phone] if you have not heard about the biopsies in 3 weeks.    SIGNATURES/CONFIDENTIALITY: You and/or your care partner have signed paperwork which will be entered into your electronic medical record.  These signatures attest to the fact that that the information above on your After Visit Summary has been reviewed and is understood.  Full responsibility of the confidentiality of this discharge information lies with you  and/or your care-partner. 

## 2020-09-26 NOTE — Op Note (Signed)
[place] Patient Name: Nicholas Terry Procedure Date: 09/26/2020 12:57 PM MRN: [id number] Endoscopist: Docia Chuck. Henrene Pastor , MD Age: 70 Referring MD:  Date of Birth: 1950/12/09 Gender: Male Account #: [id number] Procedure:                Colonoscopy snare polypectomy x 1 Indications:              High risk colon cancer surveillance: Personal                            history of non-advanced adenomas. Previous                            examination with Dr. Roxy Manns 2016 Medicines:                Monitored Anesthesia Care Procedure:                Pre-Anesthesia Assessment:                           - Prior to the procedure, a History and Physical                            was performed, and patient medications and                            allergies were reviewed. The patient's tolerance of                            previous anesthesia was also reviewed. The risks                            and benefits of the procedure and the sedation                            options and risks were discussed with the patient.                            All questions were answered, and informed consent                            was obtained. Prior Anticoagulants: The patient has                            taken Xarelto (rivaroxaban), last dose was 4 days                            prior to procedure. ASA Grade Assessment: III - A                            patient with severe systemic disease. After                            reviewing the risks and benefits, the patient was  deemed in satisfactory condition to undergo the                            procedure.                           After obtaining informed consent, the colonoscope                            was passed under direct vision. Throughout the                            procedure, the patient's blood pressure, pulse, and                            oxygen saturations were monitored continuously. The                             Olympus CF-[id number]) Colonoscope was                            introduced through the anus and advanced to the the                            cecum, identified by appendiceal orifice and                            ileocecal valve. The ileocecal valve, appendiceal                            orifice, and rectum were photographed. The quality                            of the bowel preparation was good. The colonoscopy                            was performed without difficulty. The patient                            tolerated the procedure well. The bowel preparation                            used was SUPREP via split dose instruction. Scope In: 1:01:24 PM Scope Out: 1:18:49 PM Scope Withdrawal Time: 0 hours 11 minutes 51 seconds  Total Procedure Duration: 0 hours 17 minutes 25 seconds  Findings:                 A 6 mm polyp was found in the cecum. The polyp was                            sessile. The polyp was removed with a cold snare.                            Resection and retrieval were complete.  Internal hemorrhoids were found during                            retroflexion. The hemorrhoids were medium-sized.                           The exam was otherwise without abnormality on                            direct and retroflexion views. Complications:            No immediate complications. Estimated blood loss:                            None. Estimated Blood Loss:     Estimated blood loss: none. Impression:               - One 6 mm polyp in the cecum, removed with a cold                            snare. Resected and retrieved.                           - Internal hemorrhoids. Symptomatic. Amenable to an                            office banding procedure                           - The examination was otherwise normal on direct                            and retroflexion views. Recommendation:           - Repeat  colonoscopy in 5 years for surveillance.                           - Resume Xarelto (rivaroxaban) today at prior dose.                           - Patient has a contact number available for                            emergencies. The signs and symptoms of potential                            delayed complications were discussed with the                            patient. Return to normal activities tomorrow.                            Written discharge instructions were provided to the                            patient.                           -  Resume previous diet.                           - Continue present medications.                           - Await pathology results.                           -PLEASE schedule office hemorrhoidal banding                            appointment with Dr. Hilarie Fredrickson. Docia Chuck. Henrene Pastor, MD 09/26/2020 1:25:11 PM This report has been signed electronically.

## 2020-09-26 NOTE — Progress Notes (Signed)
A/ox3, pleased with MAC, report to RN 

## 2020-09-26 NOTE — Progress Notes (Signed)
Called to room to assist during endoscopic procedure.  Patient ID and intended procedure confirmed with present staff. Received instructions for my participation in the procedure from the performing physician.  

## 2020-09-28 ENCOUNTER — Telehealth: Payer: Self-pay

## 2020-09-28 NOTE — Telephone Encounter (Signed)
  Follow up Call-  Call back number 09/26/2020  Post procedure Call Back phone  # [phone]  Permission to leave phone message Yes  Some recent data might be hidden     Patient questions:  Do you have a fever, pain , or abdominal swelling? No. Pain Score  0 *  Have you tolerated food without any problems? Yes.    Have you been able to return to your normal activities? Yes.    Do you have any questions about your discharge instructions: Diet   No. Medications  No. Follow up visit  No.  Do you have questions or concerns about your Care? No.  Actions: * If pain score is 4 or above: No action needed, pain <4. Talked to [place].  Pt. Out getting his car inspected.

## 2020-10-02 ENCOUNTER — Encounter: Payer: Self-pay | Admitting: Internal Medicine

## 2020-10-17 DIAGNOSIS — L723 Sebaceous cyst: Secondary | ICD-10-CM | POA: Diagnosis not present

## 2020-10-17 DIAGNOSIS — L821 Other seborrheic keratosis: Secondary | ICD-10-CM | POA: Diagnosis not present

## 2020-10-17 DIAGNOSIS — D2261 Melanocytic nevi of right upper limb, including shoulder: Secondary | ICD-10-CM | POA: Diagnosis not present

## 2020-10-25 ENCOUNTER — Other Ambulatory Visit: Payer: Self-pay

## 2020-10-25 ENCOUNTER — Encounter: Payer: Self-pay | Admitting: Internal Medicine

## 2020-10-25 ENCOUNTER — Ambulatory Visit: Payer: Medicare PPO | Admitting: Internal Medicine

## 2020-10-25 VITALS — BP 100/70 | HR 86 | Ht 69.0 in | Wt 143.0 lb

## 2020-10-25 DIAGNOSIS — K648 Other hemorrhoids: Secondary | ICD-10-CM | POA: Diagnosis not present

## 2020-10-25 DIAGNOSIS — Z7901 Long term (current) use of anticoagulants: Secondary | ICD-10-CM

## 2020-10-25 NOTE — Patient Instructions (Addendum)
You have been scheduled for hemorrhoidal banding on 11/27/20 at 4:00 pm.  If you are age 71 or older, your body mass index should be between 23-30. Your Body mass index is 21.12 kg/m. If this is out of the aforementioned range listed, please consider follow up with your Primary Care Provider.  Due to recent changes in healthcare laws, you may see the results of your imaging and laboratory studies on MyChart before your provider has had a chance to review them.  We understand that in some cases there may be results that are confusing or concerning to you. Not all laboratory results come back in the same time frame and the provider may be waiting for multiple results in order to interpret others.  Please give [place] 48 hours in order for your provider to thoroughly review all the results before contacting the office for clarification of your results.    HEMORRHOID BANDING PROCEDURE    FOLLOW-UP CARE   1. The procedure you have had should have been relatively painless since the banding of the area involved does not have nerve endings and there is no pain sensation.  The rubber band cuts off the blood supply to the hemorrhoid and the band may fall off as soon as 48 hours after the banding (the band may occasionally be seen in the toilet bowl following a bowel movement). You may notice a temporary feeling of fullness in the rectum which should respond adequately to plain Tylenol or Motrin.  2. Following the banding, avoid strenuous exercise that evening and resume full activity the next day.  A sitz bath (soaking in a warm tub) or bidet is soothing, and can be useful for cleansing the area after bowel movements.     3. To avoid constipation, take two tablespoons of natural wheat bran, natural oat bran, flax, Benefiber or any over the counter fiber supplement and increase your water intake to 7-8 glasses daily.    4. Unless you have been prescribed anorectal medication, do not put anything inside your  rectum for two weeks: No suppositories, enemas, fingers, etc.  5. Occasionally, you may have more bleeding than usual after the banding procedure.  This is often from the untreated hemorrhoids rather than the treated one.  Don't be concerned if there is a tablespoon or so of blood.  If there is more blood than this, lie flat with your bottom higher than your head and apply an ice pack to the area. If the bleeding does not stop within a half an hour or if you feel faint, call our office at [phone] or go to the emergency room.  6. Problems are not common; however, if there is a substantial amount of bleeding, severe pain, chills, fever or difficulty passing urine (very rare) or other problems, you should call us at [phone] or report to the nearest emergency room.  7. Do not stay seated continuously for more than 2-3 hours for a day or two after the procedure.  Tighten your buttock muscles 10-15 times every two hours and take 10-15 deep breaths every 1-2 hours.  Do not spend more than a few minutes on the toilet if you cannot empty your bowel; instead re-visit the toilet at a later time.

## 2020-10-25 NOTE — Progress Notes (Signed)
Nicholas Terry is a 70 year old male with a longstanding history of intermittently symptomatic internal hemorrhoids.  Patient of Dr. Henrene Pastor.  Recent colonoscopy performed on 09/26/2020.  6 mm polyp removed from the cecum.  Medium sized internal hemorrhoids.  Otherwise normal exam.  The cecal polyp was an adenoma.  From a hemorrhoid perspective he has had issues with prolapse and discomfort.  He does have to reduce the hemorrhoid on occasion.  He denies bleeding and leakage.  He uses ointments, suppositories and medicated pads periodically.  He does take Xarelto  Rectal: Grade 3 prolapsed internal hemorrhoid, no masses. ANOSCOPY: Using a disposable, lubricated, slotted, self-illuminating anoscope, the rectum was intubated without difficulty. The trochar was removed and the ano-rectum was circumferentially inspected. There were internal hemorrhoids, RA >LL>RP. There was no finding of an anorectal fissure. The rectal mucosa was not inflamed. No neoplasia or other pathology was identified. The inspection was well tolerated.    PROCEDURE NOTE:  The patient presents with symptomatic grade 3 internal hemorrhoids, requesting rubber band ligation of his hemorrhoidal disease.  All risks, benefits and alternative forms of therapy were described and informed consent was obtained.  We specifically discussed the higher than baseline risk for bleeding in the setting of anticoagulation, in his case Xarelto.  There is risk of post banding hemorrhage that can be present up to 2 weeks after hemorrhoidal banding.  The risk of discontinuation of anticoagulation for 2 weeks after banding may exceed the risk of continuing therapy.  After this discussion he wishes to proceed.  The anorectum was pre-medicated with 0.125% nitroglycerin ointment The decision was made to band the RA internal hemorrhoid, and the [place] was used to perform band ligation without complication.   Digital anorectal examination was then  performed to assure proper positioning of the band, and to adjust the banded tissue as required.  The patient was discharged home without pain or other issues.  Dietary and behavioral recommendations were given and along with follow-up instructions.     The patient will return as scheduled for follow-up and possible additional banding as required. No complications were encountered and the patient tolerated the procedure well.

## 2020-10-25 NOTE — Patient Instructions (Addendum)
DUE TO COVID-19 ONLY ONE VISITOR IS ALLOWED TO COME WITH YOU AND STAY IN THE WAITING ROOM ONLY DURING PRE OP AND PROCEDURE DAY OF SURGERY. THE 1 VISITOR  MAY VISIT WITH YOU AFTER SURGERY IN YOUR PRIVATE ROOM DURING VISITING HOURS ONLY!  YOU NEED TO HAVE A COVID 19 TEST ON: 11/02/20 @ 2:40 PM , THIS TEST MUST BE DONE BEFORE SURGERY,  COVID TESTING SITE [place] [address], IT IS ON THE RIGHT GOING OUT [address] APPROXIMATELY  2 MINUTES PAST [employer] ON THE RIGHT. ONCE YOUR COVID TEST IS COMPLETED,  PLEASE BEGIN THE QUARANTINE INSTRUCTIONS AS OUTLINED IN YOUR HANDOUT.                Nicholas Terry   Your procedure is scheduled on: 11/06/20   Report to [facility] Main  Entrance   Report to admitting at: 9:00 AM     Call this number if you have problems the morning of surgery [phone]    Remember:  NO SOLID FOOD AFTER MIDNIGHT THE NIGHT PRIOR TO SURGERY. NOTHING BY MOUTH EXCEPT CLEAR LIQUIDS UNTIL: 8:30 AM . PLEASE FINISH ENSURE DRINK PER SURGEON ORDER  WHICH NEEDS TO BE COMPLETED AT: 8:30 AM .  CLEAR LIQUID DIET  Foods Allowed                                                                     Foods Excluded  Coffee and tea, regular and decaf                             liquids that you cannot  Plain Jell-O any favor except red or purple                                           see through such as: Fruit ices (not with fruit pulp)                                     milk, soups, orange juice  Iced Popsicles                                    All solid food Carbonated beverages, regular and diet                                    Cranberry, grape and apple juices Sports drinks like Gatorade Lightly seasoned clear broth or consume(fat free) Sugar, honey syrup  Sample Menu Breakfast                                Lunch  Supper Cranberry juice                    Beef broth                             Chicken broth Jell-O                                     Grape juice                           Apple juice Coffee or tea                        Jell-O                                      Popsicle                                                Coffee or tea                        Coffee or tea  _____________________________________________________________________  BRUSH YOUR TEETH MORNING OF SURGERY AND RINSE YOUR MOUTH OUT, NO CHEWING GUM CANDY OR MINTS.    Take these medicines the morning of surgery with A SIP OF WATER:loratadine.                                You may not have any metal on your body including hair pins and              piercings  Do not wear jewelry, lotions, powders or perfumes, deodorant             Men may shave face and neck.   Do not bring valuables to the hospital. [place].  Contacts, dentures or bridgework may not be worn into surgery.  Leave suitcase in the car. After surgery it may be brought to your room.     Patients discharged the day of surgery will not be allowed to drive home. IF YOU ARE HAVING SURGERY AND GOING HOME THE SAME DAY, YOU MUST HAVE AN ADULT TO DRIVE YOU HOME AND BE WITH YOU FOR 24 HOURS. YOU MAY GO HOME BY TAXI OR UBER OR ORTHERWISE, BUT AN ADULT MUST ACCOMPANY YOU HOME AND STAY WITH YOU FOR 24 HOURS.  Name and phone number of your driver:  Special Instructions: N/A              Please read over the following fact sheets you were given: _____________________________________________________________________         [facility] - Preparing for Surgery Before surgery, you can play an important role.  Because skin is not sterile, your skin needs to be as free of germs as possible.  You can reduce the number of germs on your skin by washing with CHG (chlorahexidine gluconate) soap  before surgery.  CHG is an antiseptic cleaner which kills germs and bonds with the skin to continue killing germs  even after washing. Please DO NOT use if you have an allergy to CHG or antibacterial soaps.  If your skin becomes reddened/irritated stop using the CHG and inform your nurse when you arrive at Short Stay. Do not shave (including legs and underarms) for at least 48 hours prior to the first CHG shower.  You may shave your face/neck. Please follow these instructions carefully:  1.  Shower with CHG Soap the night before surgery and the  morning of Surgery.  2.  If you choose to wash your hair, wash your hair first as usual with your  normal  shampoo.  3.  After you shampoo, rinse your hair and body thoroughly to remove the  shampoo.                           4.  Use CHG as you would any other liquid soap.  You can apply chg directly  to the skin and wash                       Gently with a scrungie or clean washcloth.  5.  Apply the CHG Soap to your body ONLY FROM THE NECK DOWN.   Do not use on face/ open                           Wound or open sores. Avoid contact with eyes, ears mouth and genitals (private parts).                       Wash face,  Genitals (private parts) with your normal soap.             6.  Wash thoroughly, paying special attention to the area where your surgery  will be performed.  7.  Thoroughly rinse your body with warm water from the neck down.  8.  DO NOT shower/wash with your normal soap after using and rinsing off  the CHG Soap.                9.  Pat yourself dry with a clean towel.            10.  Wear clean pajamas.            11.  Place clean sheets on your bed the night of your first shower and do not  sleep with pets. Day of Surgery : Do not apply any lotions/deodorants the morning of surgery.  Please wear clean clothes to the hospital/surgery center.  FAILURE TO FOLLOW THESE INSTRUCTIONS MAY RESULT IN THE CANCELLATION OF YOUR SURGERY PATIENT SIGNATURE_________________________________  NURSE  SIGNATURE__________________________________  ________________________________________________________________________   Nicholas Terry  An incentive spirometer is a tool that can help keep your lungs clear and active. This tool measures how well you are filling your lungs with each breath. Taking long deep breaths may help reverse or decrease the chance of developing breathing (pulmonary) problems (especially infection) following:  A long period of time when you are unable to move or be active. BEFORE THE PROCEDURE   If the spirometer includes an indicator to show your best effort, your nurse or respiratory therapist will set it to a desired goal.  If possible, sit up straight or lean slightly forward. Try  not to slouch.  Hold the incentive spirometer in an upright position. INSTRUCTIONS FOR USE  1. Sit on the edge of your bed if possible, or sit up as far as you can in bed or on a chair. 2. Hold the incentive spirometer in an upright position. 3. Breathe out normally. 4. Place the mouthpiece in your mouth and seal your lips tightly around it. 5. Breathe in slowly and as deeply as possible, raising the piston or the ball toward the top of the column. 6. Hold your breath for 3-5 seconds or for as long as possible. Allow the piston or ball to fall to the bottom of the column. 7. Remove the mouthpiece from your mouth and breathe out normally. 8. Rest for a few seconds and repeat Steps 1 through 7 at least 10 times every 1-2 hours when you are awake. Take your time and take a few normal breaths between deep breaths. 9. The spirometer may include an indicator to show your best effort. Use the indicator as a goal to work toward during each repetition. 10. After each set of 10 deep breaths, practice coughing to be sure your lungs are clear. If you have an incision (the cut made at the time of surgery), support your incision when coughing by placing a pillow or rolled up towels firmly  against it. Once you are able to get out of bed, walk around indoors and cough well. You may stop using the incentive spirometer when instructed by your caregiver.  RISKS AND COMPLICATIONS  Take your time so you do not get dizzy or light-headed.  If you are in pain, you may need to take or ask for pain medication before doing incentive spirometry. It is harder to take a deep breath if you are having pain. AFTER USE  Rest and breathe slowly and easily.  It can be helpful to keep track of a log of your progress. Your caregiver can provide you with a simple table to help with this. If you are using the spirometer at home, follow these instructions: [place] IF:   You are having difficultly using the spirometer.  You have trouble using the spirometer as often as instructed.  Your pain medication is not giving enough relief while using the spirometer.  You develop fever of 100.5 F (38.1 C) or higher. SEEK IMMEDIATE MEDICAL CARE IF:   You cough up bloody sputum that had not been present before.  You develop fever of 102 F (38.9 C) or greater.  You develop worsening pain at or near the incision site. MAKE SURE YOU:   Understand these instructions.  Will watch your condition.  Will get help right away if you are not doing well or get worse. Document Released: 11/17/2006 Document Revised: 09/29/2011 Document Reviewed: 01/18/2007 [facility] Patient Information 2014 [place], [place].   ________________________________________________________________________

## 2020-10-26 ENCOUNTER — Other Ambulatory Visit: Payer: Self-pay

## 2020-10-26 ENCOUNTER — Encounter (HOSPITAL_COMMUNITY)
Admission: RE | Admit: 2020-10-26 | Discharge: 2020-10-26 | Disposition: A | Discharge status: Home / Self Care | Payer: Medicare PPO | Source: Ambulatory Visit | Attending: Orthopedic Surgery | Admitting: Orthopedic Surgery

## 2020-10-26 ENCOUNTER — Encounter (HOSPITAL_COMMUNITY): Payer: Self-pay

## 2020-10-26 DIAGNOSIS — Z01812 Encounter for preprocedural laboratory examination: Secondary | ICD-10-CM | POA: Diagnosis not present

## 2020-10-26 LAB — CBC
HCT: 44.2 % (ref 39.0–52.0)
Hemoglobin: 14.6 g/dL (ref 13.0–17.0)
MCH: 32.7 pg (ref 26.0–34.0)
MCHC: 33 g/dL (ref 30.0–36.0)
MCV: 98.9 fL (ref 80.0–100.0)
Platelets: 232 10*3/uL (ref 150–400)
RBC: 4.47 MIL/uL (ref 4.22–5.81)
RDW: 13.1 % (ref 11.5–15.5)
WBC: 7.8 10*3/uL (ref 4.0–10.5)
nRBC: 0 % (ref 0.0–0.2)

## 2020-10-26 LAB — TYPE AND SCREEN
ABO/RH(D): A NEG
Antibody Screen: NEGATIVE

## 2020-10-26 LAB — SURGICAL PCR SCREEN
MRSA, PCR: NEGATIVE
Staphylococcus aureus: NEGATIVE

## 2020-10-26 NOTE — Progress Notes (Signed)
COVID Vaccine Completed: Yes Date COVID Vaccine completed: 04/2020 [place] COVID vaccine manufacturer: Pfizer       PCP - Dr. Sueanne Margarita Cardiologist -   Chest x-ray -  EKG - 05/09/20 Stress Test -  ECHO -  Cardiac Cath -  Pacemaker/ICD device last checked:  Sleep Study -  CPAP -   Fasting Blood Sugar -  Checks Blood Sugar _____ times a day  Blood Thinner Instructions: Xarelto will be held 3 days before as per Dr. Aurea Graff instructions. Aspirin Instructions: Last Dose:  Anesthesia review: Hx: DVT  Patient denies shortness of breath, fever, cough and chest pain at PAT appointment   Patient verbalized understanding of instructions that were given to them at the PAT appointment. Patient was also instructed that they will need to review over the PAT instructions again at home before surgery.

## 2020-11-01 DIAGNOSIS — Z79899 Other long term (current) drug therapy: Secondary | ICD-10-CM | POA: Diagnosis not present

## 2020-11-01 DIAGNOSIS — Z86718 Personal history of other venous thrombosis and embolism: Secondary | ICD-10-CM | POA: Diagnosis not present

## 2020-11-01 DIAGNOSIS — Z7901 Long term (current) use of anticoagulants: Secondary | ICD-10-CM | POA: Diagnosis not present

## 2020-11-01 DIAGNOSIS — M1612 Unilateral primary osteoarthritis, left hip: Secondary | ICD-10-CM | POA: Diagnosis not present

## 2020-11-01 DIAGNOSIS — Z01818 Encounter for other preprocedural examination: Secondary | ICD-10-CM | POA: Diagnosis not present

## 2020-11-02 ENCOUNTER — Other Ambulatory Visit (HOSPITAL_COMMUNITY)
Admission: RE | Admit: 2020-11-02 | Discharge: 2020-11-02 | Disposition: A | Discharge status: Home / Self Care | Payer: Medicare PPO | Source: Ambulatory Visit | Attending: Orthopedic Surgery | Admitting: Orthopedic Surgery

## 2020-11-02 DIAGNOSIS — Z20822 Contact with and (suspected) exposure to covid-19: Secondary | ICD-10-CM | POA: Diagnosis not present

## 2020-11-02 DIAGNOSIS — Z01812 Encounter for preprocedural laboratory examination: Secondary | ICD-10-CM | POA: Diagnosis not present

## 2020-11-02 LAB — SARS CORONAVIRUS 2 (TAT 6-24 HRS): SARS Coronavirus 2: NEGATIVE

## 2020-11-05 NOTE — H&P (Signed)
TOTAL HIP ADMISSION H&P  Patient is admitted for left total hip arthroplasty.  Subjective:  Chief Complaint: left hip pain  HPI: Nicholas Terry, 70 y.o. male, has a history of pain and functional disability in the left hip(s) due to arthritis and patient has failed non-surgical conservative treatments for greater than 12 weeks to include NSAID's and/or analgesics, corticosteriod injections and activity modification.  Onset of symptoms was gradual starting 3 years ago with gradually worsening course since that time.The patient noted no past surgery on the left hip(s).  Patient currently rates pain in the left hip at 8 out of 10 with activity. Patient has worsening of pain with activity and weight bearing, pain that interfers with activities of daily living and pain with passive range of motion. Patient has evidence of joint space narrowing by imaging studies. This condition presents safety issues increasing the risk of falls.  There is no current active infection.  Patient Active Problem List   Diagnosis Date Noted  . 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 Arthritis   . BPH (benign prostatic hyperplasia)   . Complication of anesthesia   . DVT (deep venous thrombosis) (HCC)    bil calf  . ED (erectile dysfunction)   . Foot mass, left    left plantar forefoot  . History of colon polyps   . History of kidney stones   . Internal hemorrhoids   . Pneumonia    hx of   . PONV (postoperative nausea and vomiting)   . Seasonal allergies     Past Surgical History:  Procedure Laterality Date  . COLONOSCOPY    . JOINT REPLACEMENT Left    knee  . KIDNEY STONE SURGERY     x3  . KNEE ARTHROSCOPY Bilateral   . MASS EXCISION Left 03/11/2018   Procedure: Excisional biopsy of left forefoot mass;  Surgeon: Wylene Simmer, MD;  Location: [place];  Service: Orthopedics;  Laterality: Left;  60 mins  . SHOULDER ARTHROSCOPY Right   . TOTAL  SHOULDER ARTHROPLASTY Left 05/27/2018  . TOTAL SHOULDER ARTHROPLASTY Left 05/27/2018   Procedure: LEFT TOTAL SHOULDER ARTHROPLASTY;  Surgeon: Justice Britain, MD;  Location: [place];  Service: Orthopedics;  Laterality: Left;  178min  . TOTAL SHOULDER ARTHROPLASTY Right 05/17/2020   Procedure: TOTAL SHOULDER ARTHROPLASTY;  Surgeon: Justice Britain, MD;  Location: [facility];  Service: Orthopedics;  Laterality: Right;  148min    No current facility-administered medications for this encounter.   Current Outpatient Medications  Medication Sig Dispense Refill Last Dose  . acetaminophen (TYLENOL) 650 MG CR tablet Take 650 mg by mouth daily.     . APPLE CIDER VINEGAR PO Take 1 each by mouth 2 (two) times daily.      . Carboxymethylcellul-Glycerin (REFRESH OPTIVE OP) Place 1 drop into both eyes daily as needed (dry eyes).      . hydrocortisone (ANUSOL-HC) 2.5 % rectal cream APPLY RECTALLY TO THE AFFECTED AREA AT BEDTIME (Patient taking differently: Place 1 application rectally daily as needed for hemorrhoids.) 30 g 1   . loratadine (CLARITIN) 10 MG tablet Take 10 mg by mouth daily.     . Multiple Vitamin (MULTIVITAMIN WITH MINERALS) TABS tablet Take 1 tablet by mouth daily.     Marland Kitchen OVER THE COUNTER MEDICATION Apply 1 application topically daily as needed (joint pain). cbd cream     . OVER THE COUNTER MEDICATION Take 0.5 capsules by mouth daily as  needed (pain). cbd gummies     . Saw Palmetto, Serenoa repens, (SAW PALMETTO PO) Take 1 tablet by mouth daily.     . traMADol (ULTRAM) 50 MG tablet Take 1 tablet (50 mg total) by mouth every 6 (six) hours as needed for moderate pain. 30 tablet 0   . XARELTO 10 MG TABS tablet Take 10 mg by mouth daily.       Allergies  Allergen Reactions  . Sulfa Antibiotics Rash    Social History   Tobacco Use  . Smoking status: Never Smoker  . Smokeless tobacco: Never Used  Substance Use Topics  . Alcohol use: Yes    Comment: social    Family History  Problem Relation Age of  Onset  . Parkinson's disease Mother   . Stroke Father   . Colon cancer Neg Hx   . Esophageal cancer Neg Hx   . Rectal cancer Neg Hx   . Colon polyps Neg Hx   . Stomach cancer Neg Hx      Review of Systems  Constitutional: Negative for chills and fever.  Respiratory: Negative for cough and shortness of breath.   Cardiovascular: Negative for chest pain.  Gastrointestinal: Negative for nausea and vomiting.  Musculoskeletal: Positive for arthralgias.    Objective:  Physical Exam Well nourished and well developed. General: Alert and oriented x3, cooperative and pleasant, no acute distress. Head: normocephalic, atraumatic, neck supple. Eyes: EOMI.  Musculoskeletal:  Focused left hip exam: Pain with active hip flexion with external rotation Pain with passive hip flexion internal rotation to 5 of pelvic tilting, external rotation to 20 Neurovascular intact distally In comparison his right hip moves fluidly without pain from 15 of internal rotation to 30 extra rotation  Calves soft and nontender. Motor function intact in LE. Strength 5/5 LE bilaterally. Neuro: Distal pulses 2+. Sensation to light touch intact in LE.  Vital signs in last 24 hours:    Labs:   Estimated body mass index is 21.12 kg/m as calculated from the following:   Height as of 10/25/20: 5\' 9"  (1.753 m).   Weight as of 10/25/20: 64.9 kg.   Imaging Review Plain radiographs demonstrate severe degenerative joint disease of the left hip(s). The bone quality appears to be adequate for age and reported activity level. Assessment/Plan:  End stage arthritis, left hip(s)  The patient history, physical examination, clinical judgement of the provider and imaging studies are consistent with end stage degenerative joint disease of the left hip(s) and total hip arthroplasty is deemed medically necessary. The treatment options including medical management, injection therapy, arthroscopy and arthroplasty were discussed  at length. The risks and benefits of total hip arthroplasty were presented and reviewed. The risks due to aseptic loosening, infection, stiffness, dislocation/subluxation,  thromboembolic complications and other imponderables were discussed.  The patient acknowledged the explanation, agreed to proceed with the plan and consent was signed. Patient is being admitted for inpatient treatment for surgery, pain control, PT, OT, prophylactic antibiotics, VTE prophylaxis, progressive ambulation and ADL's and discharge planning.The patient is planning to be discharged home  Therapy Plans: HEP Disposition: Home with girlfriend Planned DVT Prophylaxis: Xarelto 10mg  daily DME needed: none PCP: Dr. Ardine Bjork, awaiting clearance TXA: IV Allergies: Sulfa drugs - hives Anesthesia Concerns: none BMI: 21.1 Not diabetic  Other: - Planning on SDD - Hx of several DVTs, on Xarelto - Patient has Perocet & Tramadol at home from previous shoulder surgery, no pain meds sent, other meds sent   Griffith Citron, PA-C  Orthopedic Surgery [facility] [place] [phone]

## 2020-11-05 NOTE — Anesthesia Preprocedure Evaluation (Addendum)
Anesthesia Evaluation  Patient identified by MRN, date of birth, ID band Patient awake    Reviewed: Allergy & Precautions, NPO status , Patient's Chart, lab work & pertinent test results  History of Anesthesia Complications (+) PONV and history of anesthetic complications  Airway Mallampati: II  TM Distance: >3 FB Neck ROM: Full    Dental no notable dental hx. (+) Teeth Intact, Dental Advisory Given, Caps   Pulmonary    Pulmonary exam normal breath sounds clear to auscultation       Cardiovascular + DVT (hx on Xarelto)  Normal cardiovascular exam Rhythm:Regular Rate:Normal  1020 EKG SR R 65   Neuro/Psych    GI/Hepatic   Endo/Other    Renal/GU K+ 4.2 Cr 0.89     Musculoskeletal  (+) Arthritis ,   Abdominal   Peds  Hematology Hgb 15.2 Plt 228   Anesthesia Other Findings NKDA   Reproductive/Obstetrics                            Anesthesia Physical  Anesthesia Plan  ASA: II  Anesthesia Plan: General   Post-op Pain Management:    Induction: Intravenous  PONV Risk Score and Plan: 4 or greater and Treatment may vary due to age or medical condition, Ondansetron and Dexamethasone  Airway Management Planned: Oral ETT and LMA  Additional Equipment: None  Intra-op Plan:   Post-operative Plan:   Informed Consent: I have reviewed the patients History and Physical, chart, labs and discussed the procedure including the risks, benefits and alternatives for the proposed anesthesia with the patient or authorized representative who has indicated his/her understanding and acceptance.     Dental advisory given  Plan Discussed with: Anesthesiologist and CRNA  Anesthesia Plan Comments: (History of severe urinary retention.   )       Anesthesia Quick Evaluation

## 2020-11-06 ENCOUNTER — Encounter (HOSPITAL_COMMUNITY)
Admission: RE | Disposition: A | Discharge status: Home / Self Care | Payer: Self-pay | Source: Home / Self Care | Attending: Orthopedic Surgery

## 2020-11-06 ENCOUNTER — Encounter (HOSPITAL_COMMUNITY): Payer: Self-pay | Admitting: Orthopedic Surgery

## 2020-11-06 ENCOUNTER — Ambulatory Visit (HOSPITAL_COMMUNITY): Payer: Medicare PPO | Admitting: Anesthesiology

## 2020-11-06 ENCOUNTER — Observation Stay (HOSPITAL_COMMUNITY)
Admission: RE | Admit: 2020-11-06 | Discharge: 2020-11-07 | Disposition: A | Discharge status: Home / Self Care | Payer: Medicare PPO | Attending: Orthopedic Surgery | Admitting: Orthopedic Surgery

## 2020-11-06 ENCOUNTER — Other Ambulatory Visit: Payer: Self-pay

## 2020-11-06 ENCOUNTER — Ambulatory Visit (HOSPITAL_COMMUNITY): Payer: Medicare PPO

## 2020-11-06 DIAGNOSIS — Z7901 Long term (current) use of anticoagulants: Secondary | ICD-10-CM | POA: Insufficient documentation

## 2020-11-06 DIAGNOSIS — Z96649 Presence of unspecified artificial hip joint: Secondary | ICD-10-CM

## 2020-11-06 DIAGNOSIS — Z96642 Presence of left artificial hip joint: Secondary | ICD-10-CM | POA: Diagnosis not present

## 2020-11-06 DIAGNOSIS — Z96612 Presence of left artificial shoulder joint: Secondary | ICD-10-CM | POA: Diagnosis not present

## 2020-11-06 DIAGNOSIS — Z419 Encounter for procedure for purposes other than remedying health state, unspecified: Secondary | ICD-10-CM

## 2020-11-06 DIAGNOSIS — Z96611 Presence of right artificial shoulder joint: Secondary | ICD-10-CM | POA: Insufficient documentation

## 2020-11-06 DIAGNOSIS — Z471 Aftercare following joint replacement surgery: Secondary | ICD-10-CM | POA: Diagnosis not present

## 2020-11-06 DIAGNOSIS — Z96652 Presence of left artificial knee joint: Secondary | ICD-10-CM | POA: Diagnosis not present

## 2020-11-06 DIAGNOSIS — K649 Unspecified hemorrhoids: Secondary | ICD-10-CM | POA: Diagnosis not present

## 2020-11-06 DIAGNOSIS — N400 Enlarged prostate without lower urinary tract symptoms: Secondary | ICD-10-CM | POA: Diagnosis not present

## 2020-11-06 DIAGNOSIS — M1612 Unilateral primary osteoarthritis, left hip: Secondary | ICD-10-CM | POA: Diagnosis not present

## 2020-11-06 DIAGNOSIS — J302 Other seasonal allergic rhinitis: Secondary | ICD-10-CM | POA: Diagnosis not present

## 2020-11-06 HISTORY — PX: TOTAL HIP ARTHROPLASTY: SHX124

## 2020-11-06 LAB — ABO/RH: ABO/RH(D): A NEG

## 2020-11-06 SURGERY — ARTHROPLASTY, HIP, TOTAL, ANTERIOR APPROACH
Anesthesia: General | Site: Hip | Laterality: Left

## 2020-11-06 MED ORDER — ACETAMINOPHEN 160 MG/5ML PO SOLN: 325.0000 mg | ORAL | Status: DC | PRN

## 2020-11-06 MED ORDER — FENTANYL CITRATE (PF) 100 MCG/2ML IJ SOLN
INTRAMUSCULAR | Status: AC
  Filled 2020-11-06: qty 2

## 2020-11-06 MED ORDER — KETAMINE HCL 10 MG/ML IJ SOLN
INTRAMUSCULAR | Status: DC | PRN
  Administered 2020-11-06 (×2): 20 mg via INTRAVENOUS

## 2020-11-06 MED ORDER — PHENAZOPYRIDINE HCL 100 MG PO TABS
100.0000 mg | ORAL_TABLET | Freq: Three times a day (TID) | ORAL | Status: DC
  Administered 2020-11-06: 100 mg via ORAL
  Filled 2020-11-06 (×3): qty 1

## 2020-11-06 MED ORDER — OXYCODONE HCL 5 MG/5ML PO SOLN: 5.0000 mg | Freq: Once | ORAL | Status: AC | PRN

## 2020-11-06 MED ORDER — ONDANSETRON HCL 4 MG/2ML IJ SOLN: 4.0000 mg | Freq: Four times a day (QID) | INTRAMUSCULAR | Status: DC | PRN

## 2020-11-06 MED ORDER — LORATADINE 10 MG PO TABS: 10.0000 mg | ORAL_TABLET | Freq: Every day | ORAL | Status: DC | PRN

## 2020-11-06 MED ORDER — POLYETHYLENE GLYCOL 3350 17 G PO PACK: 17.0000 g | PACK | Freq: Every day | ORAL | Status: DC | PRN

## 2020-11-06 MED ORDER — SCOPOLAMINE 1 MG/3DAYS TD PT72
MEDICATED_PATCH | TRANSDERMAL | Status: DC | PRN
  Administered 2020-11-06: 1 via TRANSDERMAL

## 2020-11-06 MED ORDER — CHLORHEXIDINE GLUCONATE 0.12 % MT SOLN: 15.0000 mL | Freq: Once | OROMUCOSAL | Status: AC

## 2020-11-06 MED ORDER — ACETAMINOPHEN 325 MG PO TABS: 325.0000 mg | ORAL_TABLET | ORAL | Status: DC | PRN

## 2020-11-06 MED ORDER — METHOCARBAMOL 500 MG IVPB - SIMPLE MED
INTRAVENOUS | Status: AC
  Filled 2020-11-06: qty 50

## 2020-11-06 MED ORDER — ACETAMINOPHEN 325 MG PO TABS
325.0000 mg | ORAL_TABLET | Freq: Four times a day (QID) | ORAL | Status: DC | PRN
Start: 2020-11-07 — End: 2020-11-07

## 2020-11-06 MED ORDER — FENTANYL CITRATE (PF) 100 MCG/2ML IJ SOLN
INTRAMUSCULAR | Status: DC | PRN
  Administered 2020-11-06 (×2): 100 ug via INTRAVENOUS

## 2020-11-06 MED ORDER — METOCLOPRAMIDE HCL 5 MG/ML IJ SOLN: 5.0000 mg | Freq: Three times a day (TID) | INTRAMUSCULAR | Status: DC | PRN

## 2020-11-06 MED ORDER — OXYCODONE HCL 5 MG PO TABS
5.0000 mg | ORAL_TABLET | Freq: Once | ORAL | Status: AC | PRN
  Administered 2020-11-06: 5 mg via ORAL

## 2020-11-06 MED ORDER — BISACODYL 10 MG RE SUPP: 10.0000 mg | Freq: Every day | RECTAL | Status: DC | PRN

## 2020-11-06 MED ORDER — SODIUM CHLORIDE 0.9 % IV SOLN: INTRAVENOUS | Status: DC

## 2020-11-06 MED ORDER — PROPOFOL 10 MG/ML IV BOLUS
INTRAVENOUS | Status: AC
  Filled 2020-11-06: qty 20

## 2020-11-06 MED ORDER — ONDANSETRON HCL 4 MG PO TABS: 4.0000 mg | ORAL_TABLET | Freq: Four times a day (QID) | ORAL | Status: DC | PRN

## 2020-11-06 MED ORDER — TRANEXAMIC ACID-NACL 1000-0.7 MG/100ML-% IV SOLN
1000.0000 mg | Freq: Once | INTRAVENOUS | Status: AC
  Administered 2020-11-06: 1000 mg via INTRAVENOUS
  Filled 2020-11-06: qty 100

## 2020-11-06 MED ORDER — METOCLOPRAMIDE HCL 5 MG PO TABS: 5.0000 mg | ORAL_TABLET | Freq: Three times a day (TID) | ORAL | Status: DC | PRN

## 2020-11-06 MED ORDER — DEXAMETHASONE SODIUM PHOSPHATE 10 MG/ML IJ SOLN
10.0000 mg | Freq: Once | INTRAMUSCULAR | Status: AC
  Administered 2020-11-06: 10 mg via INTRAVENOUS

## 2020-11-06 MED ORDER — FERROUS SULFATE 325 (65 FE) MG PO TABS
325.0000 mg | ORAL_TABLET | Freq: Three times a day (TID) | ORAL | Status: DC
  Administered 2020-11-06 – 2020-11-07 (×3): 325 mg via ORAL
  Filled 2020-11-06 (×3): qty 1

## 2020-11-06 MED ORDER — HYDROCODONE-ACETAMINOPHEN 5-325 MG PO TABS
1.0000 | ORAL_TABLET | ORAL | Status: DC | PRN
  Administered 2020-11-06 – 2020-11-07 (×2): 2 via ORAL
  Filled 2020-11-06 (×2): qty 2

## 2020-11-06 MED ORDER — TRANEXAMIC ACID-NACL 1000-0.7 MG/100ML-% IV SOLN
1000.0000 mg | INTRAVENOUS | Status: AC
  Administered 2020-11-06: 1000 mg via INTRAVENOUS
  Filled 2020-11-06: qty 100

## 2020-11-06 MED ORDER — MORPHINE SULFATE (PF) 4 MG/ML IV SOLN: 0.5000 mg | INTRAVENOUS | Status: DC | PRN

## 2020-11-06 MED ORDER — MEPERIDINE HCL 50 MG/ML IJ SOLN: 6.2500 mg | INTRAMUSCULAR | Status: DC | PRN

## 2020-11-06 MED ORDER — METHOCARBAMOL 500 MG PO TABS
500.0000 mg | ORAL_TABLET | Freq: Four times a day (QID) | ORAL | Status: DC | PRN
  Filled 2020-11-06: qty 1

## 2020-11-06 MED ORDER — FENTANYL CITRATE (PF) 100 MCG/2ML IJ SOLN
25.0000 ug | INTRAMUSCULAR | Status: DC | PRN
  Administered 2020-11-06 (×5): 25 ug via INTRAVENOUS

## 2020-11-06 MED ORDER — PROPOFOL 500 MG/50ML IV EMUL
INTRAVENOUS | Status: DC | PRN
  Administered 2020-11-06: 50 ug/kg/min via INTRAVENOUS

## 2020-11-06 MED ORDER — CEFAZOLIN SODIUM-DEXTROSE 2-4 GM/100ML-% IV SOLN
2.0000 g | INTRAVENOUS | Status: AC
  Administered 2020-11-06: 2 g via INTRAVENOUS
  Filled 2020-11-06: qty 100

## 2020-11-06 MED ORDER — LACTATED RINGERS IV SOLN: INTRAVENOUS | Status: DC

## 2020-11-06 MED ORDER — LACTATED RINGERS IV BOLUS
250.0000 mL | Freq: Once | INTRAVENOUS | Status: AC
  Administered 2020-11-06: 250 mL via INTRAVENOUS

## 2020-11-06 MED ORDER — FENTANYL CITRATE (PF) 100 MCG/2ML IJ SOLN
INTRAMUSCULAR | Status: AC
  Filled 2020-11-06: qty 4

## 2020-11-06 MED ORDER — PHENOL 1.4 % MT LIQD: 1.0000 | OROMUCOSAL | Status: DC | PRN

## 2020-11-06 MED ORDER — ORAL CARE MOUTH RINSE
15.0000 mL | Freq: Once | OROMUCOSAL | Status: AC
  Administered 2020-11-06: 15 mL via OROMUCOSAL

## 2020-11-06 MED ORDER — CEFAZOLIN SODIUM-DEXTROSE 2-4 GM/100ML-% IV SOLN
INTRAVENOUS | Status: AC
  Filled 2020-11-06: qty 100

## 2020-11-06 MED ORDER — LIDOCAINE HCL (CARDIAC) PF 100 MG/5ML IV SOSY
PREFILLED_SYRINGE | INTRAVENOUS | Status: DC | PRN
  Administered 2020-11-06: 60 mg via INTRAVENOUS

## 2020-11-06 MED ORDER — DOCUSATE SODIUM 100 MG PO CAPS
100.0000 mg | ORAL_CAPSULE | Freq: Two times a day (BID) | ORAL | Status: DC
  Administered 2020-11-06 – 2020-11-07 (×2): 100 mg via ORAL
  Filled 2020-11-06 (×2): qty 1

## 2020-11-06 MED ORDER — MIDAZOLAM HCL 5 MG/5ML IJ SOLN
INTRAMUSCULAR | Status: DC | PRN
  Administered 2020-11-06: 2 mg via INTRAVENOUS

## 2020-11-06 MED ORDER — ONDANSETRON HCL 4 MG/2ML IJ SOLN
INTRAMUSCULAR | Status: AC
  Filled 2020-11-06: qty 2

## 2020-11-06 MED ORDER — MENTHOL 3 MG MT LOZG: 1.0000 | LOZENGE | OROMUCOSAL | Status: DC | PRN

## 2020-11-06 MED ORDER — PROPOFOL 10 MG/ML IV BOLUS
INTRAVENOUS | Status: DC | PRN
  Administered 2020-11-06: 150 mg via INTRAVENOUS

## 2020-11-06 MED ORDER — LACTATED RINGERS IV BOLUS: 250.0000 mL | Freq: Once | INTRAVENOUS | Status: DC

## 2020-11-06 MED ORDER — LACTATED RINGERS IV BOLUS
500.0000 mL | Freq: Once | INTRAVENOUS | Status: AC
  Administered 2020-11-06: 500 mL via INTRAVENOUS

## 2020-11-06 MED ORDER — DIPHENHYDRAMINE HCL 12.5 MG/5ML PO ELIX: 12.5000 mg | ORAL_SOLUTION | ORAL | Status: DC | PRN

## 2020-11-06 MED ORDER — ONDANSETRON HCL 4 MG/2ML IJ SOLN
INTRAMUSCULAR | Status: DC | PRN
  Administered 2020-11-06: 4 mg via INTRAVENOUS

## 2020-11-06 MED ORDER — RIVAROXABAN 10 MG PO TABS
10.0000 mg | ORAL_TABLET | Freq: Every day | ORAL | Status: DC
  Administered 2020-11-07: 10 mg via ORAL
  Filled 2020-11-06: qty 1

## 2020-11-06 MED ORDER — MIDAZOLAM HCL 2 MG/2ML IJ SOLN
INTRAMUSCULAR | Status: AC
  Filled 2020-11-06: qty 2

## 2020-11-06 MED ORDER — METHOCARBAMOL 500 MG IVPB - SIMPLE MED
500.0000 mg | Freq: Four times a day (QID) | INTRAVENOUS | Status: DC | PRN
  Administered 2020-11-06: 500 mg via INTRAVENOUS
  Filled 2020-11-06: qty 50

## 2020-11-06 MED ORDER — TRAMADOL HCL 50 MG PO TABS
50.0000 mg | ORAL_TABLET | Freq: Four times a day (QID) | ORAL | Status: DC | PRN
  Administered 2020-11-07: 50 mg via ORAL
  Filled 2020-11-06: qty 1

## 2020-11-06 MED ORDER — SCOPOLAMINE 1 MG/3DAYS TD PT72
MEDICATED_PATCH | TRANSDERMAL | Status: AC
  Filled 2020-11-06: qty 1

## 2020-11-06 MED ORDER — DEXAMETHASONE SODIUM PHOSPHATE 10 MG/ML IJ SOLN
10.0000 mg | Freq: Once | INTRAMUSCULAR | Status: AC
  Administered 2020-11-07: 10 mg via INTRAVENOUS
  Filled 2020-11-06: qty 1

## 2020-11-06 MED ORDER — KETAMINE HCL 10 MG/ML IJ SOLN
INTRAMUSCULAR | Status: AC
  Filled 2020-11-06: qty 1

## 2020-11-06 MED ORDER — DEXAMETHASONE SODIUM PHOSPHATE 10 MG/ML IJ SOLN
INTRAMUSCULAR | Status: AC
  Filled 2020-11-06: qty 1

## 2020-11-06 MED ORDER — SODIUM CHLORIDE 0.9 % IR SOLN
Status: DC | PRN
  Administered 2020-11-06: 1000 mL

## 2020-11-06 MED ORDER — OXYCODONE HCL 5 MG PO TABS
ORAL_TABLET | ORAL | Status: AC
  Filled 2020-11-06: qty 1

## 2020-11-06 MED ORDER — CEFAZOLIN SODIUM-DEXTROSE 2-4 GM/100ML-% IV SOLN
2.0000 g | Freq: Four times a day (QID) | INTRAVENOUS | Status: AC
  Administered 2020-11-06 (×2): 2 g via INTRAVENOUS
  Filled 2020-11-06: qty 100

## 2020-11-06 MED ORDER — ONDANSETRON HCL 4 MG/2ML IJ SOLN: 4.0000 mg | Freq: Once | INTRAMUSCULAR | Status: DC | PRN

## 2020-11-06 SURGICAL SUPPLY — 32 items
BLADE SAG 18X100X1.27 (BLADE) ×2 IMPLANT
COVER PERINEAL POST (MISCELLANEOUS) ×2 IMPLANT
COVER SURGICAL LIGHT HANDLE (MISCELLANEOUS) ×2 IMPLANT
CUP ACETBLR 54 OD PINNACLE (Hips) ×2 IMPLANT
DERMABOND ADVANCED (GAUZE/BANDAGES/DRESSINGS) ×1
DERMABOND ADVANCED .7 DNX12 (GAUZE/BANDAGES/DRESSINGS) ×1 IMPLANT
DRAPE STERI IOBAN 125X83 (DRAPES) ×2 IMPLANT
DRAPE U-SHAPE 47X51 STRL (DRAPES) ×4 IMPLANT
DRSG AQUACEL AG ADV 3.5X10 (GAUZE/BANDAGES/DRESSINGS) ×2 IMPLANT
DURAPREP 26ML APPLICATOR (WOUND CARE) ×2 IMPLANT
ELECT REM PT RETURN 15FT ADLT (MISCELLANEOUS) ×2 IMPLANT
ELIMINATOR HOLE APEX DEPUY (Hips) ×2 IMPLANT
GLOVE ORTHO TXT STRL SZ7.5 (GLOVE) ×4 IMPLANT
GLOVE SURG ENC MOIS LTX SZ6 (GLOVE) ×4 IMPLANT
GLOVE SURG UNDER POLY LF SZ6.5 (GLOVE) ×2 IMPLANT
GLOVE SURG UNDER POLY LF SZ7.5 (GLOVE) ×2 IMPLANT
GOWN STRL REUS W/TWL LRG LVL3 (GOWN DISPOSABLE) ×4 IMPLANT
HEAD CERAMIC 36 PLUS5 (Hips) ×2 IMPLANT
KIT TURNOVER KIT A (KITS) ×2 IMPLANT
LINER NEUTRAL 54X36MM PLUS 4 (Hips) ×2 IMPLANT
PACK ANTERIOR HIP CUSTOM (KITS) ×2 IMPLANT
PENCIL SMOKE EVACUATOR (MISCELLANEOUS) IMPLANT
SCREW 6.5MMX30MM (Screw) ×2 IMPLANT
STEM FEM ACTIS HIGH SZ8 (Stem) ×2 IMPLANT
SUT MNCRL AB 4-0 PS2 18 (SUTURE) ×2 IMPLANT
SUT STRATAFIX 0 PDS 27 VIOLET (SUTURE) ×2
SUT VIC AB 1 CT1 36 (SUTURE) ×6 IMPLANT
SUT VIC AB 2-0 CT1 27 (SUTURE) ×2
SUT VIC AB 2-0 CT1 TAPERPNT 27 (SUTURE) ×2 IMPLANT
SUTURE STRATFX 0 PDS 27 VIOLET (SUTURE) ×1 IMPLANT
TUBE SUCTION HIGH CAP CLEAR NV (SUCTIONS) ×4 IMPLANT
WATER STERILE IRR 1000ML POUR (IV SOLUTION) ×2 IMPLANT

## 2020-11-06 NOTE — Evaluation (Signed)
Physical Therapy Evaluation Patient Details Name: Nicholas Terry MRN: [id number] DOB: 06/18/1951 Today's Date: 11/06/2020   History of Present Illness  Patient is 70 y.o. male s/p Lt THA anterior approach on 11/06/20 with PMH significant for BPH, OA, DVT, Bil TSA.  Clinical Impression  Nicholas Terry is a 70 y.o. male POD 0 s/p Lt THA. Patient reports independence with mobility at baseline. Patient is now limited by functional impairments (see PT problem list below) and requires min assist for supine<>sit, transfers, and gait with RW. Patient was limited by c/o nausea and dizziness after several short forward steps from EOB and min assist provided to return to supine. Patient instructed in exercise to facilitate circulation to manage edema and reduce risk of DVT. Patient will benefit from continued skilled PT interventions to address impairments and progress towards PLOF. Acute PT will follow to progress mobility and stair training in preparation for safe discharge home.     Follow Up Recommendations Follow surgeon's recommendation for DC plan and follow-up therapies    Equipment Recommendations  None recommended by PT    Recommendations for Other Services       Precautions / Restrictions Precautions Precautions: Fall Restrictions Weight Bearing Restrictions: No Other Position/Activity Restrictions: WBAT      Mobility  Bed Mobility Overal bed mobility: Needs Assistance Bed Mobility: Supine to Sit;Sit to Supine     Supine to sit: Min guard;HOB elevated Sit to supine: Min assist   General bed mobility comments: pt requires min cues for use of belt to assist Lt LE off EOB, pt using bed rail and taking extra time/effort. Min assist required to return to supine due to Lt hip pain and nausea/dizziness.    Transfers Overall transfer level: Needs assistance Equipment used: Rolling walker (2 wheeled) Transfers: Sit to/from Stand Sit to Stand: Min guard         General  transfer comment: cues for hand placement for power up and safe reach back to sit EOB.  Ambulation/Gait Ambulation/Gait assistance: Min guard Gait Distance (Feet): 2 Feet Assistive device: Rolling walker (2 wheeled) Gait Pattern/deviations: Step-to pattern;Decreased stride length;Decreased weight shift to left Gait velocity: decr   General Gait Details: pt took short steps forward/back from EOB wtih close min guard and min cues for safe step pattern/position of RW. pt c/o nausea and lightheaded sensation and returned to sit EOB>supine.  Stairs            Wheelchair Mobility    Modified Rankin (Stroke Patients Only)       Balance                                             Pertinent Vitals/Pain Pain Assessment: 0-10 Pain Score: 4  Pain Location: Lt hip Pain Descriptors / Indicators: Aching;Discomfort;Sore Pain Intervention(s): Limited activity within patient's tolerance;Monitored during session;Repositioned    Home Living Family/patient expects to be discharged to:: Private residence Living Arrangements: Spouse/significant other Available Help at Discharge: Family Type of Home: House Home Access: Stairs to enter Entrance Stairs-Rails: Can reach both Entrance Stairs-Number of Steps: 10 Home Layout: One level Home Equipment: Grab bars - tub/shower;Shower seat - built in;Walker - 2 wheels;Cane - single point;Toilet riser      Prior Function Level of Independence: Independent               Hand Dominance  Dominant Hand: Right    Extremity/Trunk Assessment   Upper Extremity Assessment Upper Extremity Assessment: Overall WFL for tasks assessed    Lower Extremity Assessment Lower Extremity Assessment: Overall WFL for tasks assessed    Cervical / Trunk Assessment Cervical / Trunk Assessment: Normal  Communication   Communication: No difficulties  Cognition Arousal/Alertness: Awake/alert Behavior During Therapy: WFL for tasks  assessed/performed Overall Cognitive Status: Within Functional Limits for tasks assessed                                        General Comments      Exercises Total Joint Exercises Ankle Circles/Pumps: AROM;Both;20 reps;Supine   Assessment/Plan    PT Assessment Patient needs continued PT services  PT Problem List Decreased strength;Decreased range of motion;Decreased activity tolerance;Decreased balance;Decreased mobility;Decreased knowledge of use of DME;Decreased safety awareness;Decreased knowledge of precautions;Pain       PT Treatment Interventions DME instruction;Gait training;Stair training;Functional mobility training;Therapeutic activities;Therapeutic exercise;Balance training;Patient/family education    PT Goals (Current goals can be found in the Care Plan section)  Acute Rehab PT Goals Patient Stated Goal: get moving better and go home PT Goal Formulation: With patient Time For Goal Achievement: 11/13/20 Potential to Achieve Goals: Good    Frequency 7X/week   Barriers to discharge   pt has 10 steps to enter home    Co-evaluation               AM-PAC PT "6 Clicks" Mobility  Outcome Measure Help needed turning from your back to your side while in a flat bed without using bedrails?: A Little Help needed moving from lying on your back to sitting on the side of a flat bed without using bedrails?: A Little Help needed moving to and from a bed to a chair (including a wheelchair)?: A Little Help needed standing up from a chair using your arms (e.g., wheelchair or bedside chair)?: A Little Help needed to walk in hospital room?: A Lot Help needed climbing 3-5 steps with a railing? : A Lot 6 Click Score: 16    End of Session Equipment Utilized During Treatment: Gait belt Activity Tolerance: Patient tolerated treatment well Patient left: in bed;with call bell/phone within reach;with family/visitor present Nurse Communication: Mobility status PT  Visit Diagnosis: Muscle weakness (generalized) (M62.81);Difficulty in walking, not elsewhere classified (R26.2);Pain Pain - Right/Left: Left Pain - part of body: Hip    Time: 1210-1235 PT Time Calculation (min) (ACUTE ONLY): 25 min   Charges:   PT Evaluation $PT Eval Low Complexity: 1 Low PT Treatments $Therapeutic Activity: 8-22 mins        Verner Mould, DPT Acute Rehabilitation Services Office [phone] Pager [phone]    Jacques Navy 11/06/2020, 2:00 PM

## 2020-11-06 NOTE — Care Plan (Signed)
Ortho Bundle Case Management Note  Patient Details  Name: KHANH TANORI MRN: [id number] Date of Birth: [birth year]                  L THA on 11/06/20. DCP: Home with girlfriend, Jackelyn Poling. 2 story home with 10 steps. DME: No needs. Has RW & raised toilet seats. PT: HEP   DME Arranged:  N/A DME Agency:     HH Arranged:    [place] Agency:     Additional Comments: Please contact me with any questions of if this plan should need to change.  Marianne Sofia, RN,CCM [facility]  [phone] 11/06/2020, 2:57 PM

## 2020-11-06 NOTE — Interval H&P Note (Signed)
History and Physical Interval Note:  11/06/2020 6:56 AM  Nicholas Terry  has presented today for surgery, with the diagnosis of Left hip osteoarthritis.  The various methods of treatment have been discussed with the patient and family. After consideration of risks, benefits and other options for treatment, the patient has consented to  Procedure(s) with comments: TOTAL HIP ARTHROPLASTY ANTERIOR APPROACH (Left) - 70 mins as a surgical intervention.  The patient's history has been reviewed, patient examined, no change in status, stable for surgery.  I have reviewed the patient's chart and labs.  Questions were answered to the patient's satisfaction.     [name]

## 2020-11-06 NOTE — Transfer of Care (Signed)
Immediate Anesthesia Transfer of Care Note  Patient: NADIM MALIA  Procedure(s) Performed: TOTAL HIP ARTHROPLASTY ANTERIOR APPROACH (Left Hip)  Patient Location: PACU  Anesthesia Type:General  Level of Consciousness: awake, alert , oriented and patient cooperative  Airway & Oxygen Therapy: Patient Spontanous Breathing and Patient connected to face mask oxygen  Post-op Assessment: Report given to RN, Post -op Vital signs reviewed and stable and Patient moving all extremities X 4  Post vital signs: Reviewed and stable  Last Vitals:  Vitals Value Taken Time  BP 117/76 11/06/20 0953  Temp    Pulse 65 11/06/20 0958  Resp 14 11/06/20 0958  SpO2 100 % 11/06/20 0958  Vitals shown include unvalidated device data.  Last Pain:  Vitals:   11/06/20 0655  TempSrc: Oral         Complications: No complications documented.

## 2020-11-06 NOTE — Discharge Instructions (Signed)

## 2020-11-06 NOTE — Op Note (Signed)
NAME:  Nicholas Terry                ACCOUNT NO.: [id number]      MEDICAL RECORD NO.: [id number]      FACILITY:  [facility]      PHYSICIAN:  [name]  DATE OF BIRTH:  Feb 11, 1951     DATE OF PROCEDURE:  11/06/2020                                 OPERATIVE REPORT         PREOPERATIVE DIAGNOSIS: Left  hip osteoarthritis.      POSTOPERATIVE DIAGNOSIS:  Left hip osteoarthritis.      PROCEDURE:  Left total hip replacement through an anterior approach   utilizing DePuy THR system, component size 54 mm pinnacle cup, a size 36+4 neutral   Altrex liner, a size 8 Hi Actis stem with a 36+5 delta ceramic   ball.      SURGEON:  Pietro Cassis. Alvan Dame, M.D.      ASSISTANT:  Griffith Citron, PA-C     ANESTHESIA:  General.      SPECIMENS:  None.      COMPLICATIONS:  None.      BLOOD LOSS:  300 cc     DRAINS:  None.      INDICATION OF THE PROCEDURE:  Nicholas Terry is a 70 y.o. male who had   presented to office for evaluation of left hip pain.  Radiographs revealed   progressive degenerative changes with bone-on-bone   articulation of the  hip joint, including subchondral cystic changes and osteophytes.  The patient had painful limited range of   motion significantly affecting their overall quality of life and function.  The patient was failing to    respond to conservative measures including medications and/or injections and activity modification and at this point was ready   to proceed with more definitive measures.  Consent was obtained for   benefit of pain relief.  Specific risks of infection, DVT, component   failure, dislocation, neurovascular injury, and need for revision surgery were reviewed in the office as well discussion of   the anterior versus posterior approach were reviewed.     PROCEDURE IN DETAIL:  The patient was brought to operative theater.   Once adequate anesthesia, preoperative antibiotics, 2 gm of Ancef, 1 gm of Tranexamic Acid, and 10 mg  of Decadron were administered, the patient was positioned supine on the [employer] table.  Once the patient was safely positioned with adequate padding of boney prominences we predraped out the hip, and used fluoroscopy to confirm orientation of the pelvis.      The left hip was then prepped and draped from proximal iliac crest to   mid thigh with a shower curtain technique.      Time-out was performed identifying the patient, planned procedure, and the appropriate extremity.     An incision was then made 2 cm lateral to the   anterior superior iliac spine extending over the orientation of the   tensor fascia lata muscle and sharp dissection was carried down to the   fascia of the muscle.      The fascia was then incised.  The muscle belly was identified and swept   laterally and retractor placed along the superior neck.  Following   cauterization of the circumflex vessels and removing some  pericapsular   fat, a second cobra retractor was placed on the inferior neck.  A T-capsulotomy was made along the line of the   superior neck to the trochanteric fossa, then extended proximally and   distally.  Tag sutures were placed and the retractors were then placed   intracapsular.  We then identified the trochanteric fossa and   orientation of my neck cut and then made a neck osteotomy with the femur on traction.  The femoral   head was removed without difficulty or complication.  Traction was let   off and retractors were placed posterior and anterior around the   acetabulum.      The labrum and foveal tissue were debrided.  I began reaming with a 48 mm   reamer and reamed up to 53 mm reamer with good bony bed preparation and a 54 mm  cup was chosen.  The final 54 mm Pinnacle cup was then impacted under fluoroscopy to confirm the depth of penetration and orientation with respect to   Abduction and forward flexion.  A screw was placed into the ilium followed by the hole eliminator.  The final    36+4 neutral Altrex liner was impacted with good visualized rim fit.  The cup was positioned anatomically within the acetabular portion of the pelvis.      At this point, the femur was rolled to 100 degrees.  Further capsule was   released off the inferior aspect of the femoral neck.  I then   released the superior capsule proximally.  With the leg in a neutral position the hook was placed laterally   along the femur under the vastus lateralis origin and elevated manually and then held in position using the hook attachment on the bed.  The leg was then extended and adducted with the leg rolled to 100   degrees of external rotation.  Retractors were placed along the medial calcar and posteriorly over the greater trochanter.  Once the proximal femur was fully   exposed, I used a box osteotome to set orientation.  I then began   broaching with the starting chili pepper broach and passed this by hand and then broached up to 8.  With the 8 broach in place I chose a high offset neck and did several trial reductions.  The offset was appropriate, leg lengths   appeared to be equal best matched with the +5 head ball trial confirmed radiographically.   Given these findings, I went ahead and dislocated the hip, repositioned all   retractors and positioned the right hip in the extended and abducted position.  The final 8 Hi Actis stem was   chosen and it was impacted down to the level of neck cut.  Based on this   and the trial reductions, a final 36+5 delta ceramic ball was chosen and   impacted onto a clean and dry trunnion, and the hip was reduced.  The   hip had been irrigated throughout the case again at this point.  I did   reapproximate the superior capsular leaflet to the anterior leaflet   using #1 Vicryl.  The fascia of the   tensor fascia lata muscle was then reapproximated using #1 Vicryl and #0 Stratafix sutures.  The   remaining wound was closed with 2-0 Vicryl and running 4-0 Monocryl.   The  hip was cleaned, dried, and dressed sterilely using Dermabond and   Aquacel dressing.  The patient was then brought   to  recovery room in stable condition tolerating the procedure well.    Griffith Citron, PA-C was present for the entirety of the case involved from   preoperative positioning, perioperative retractor management, general   facilitation of the case, as well as primary wound closure as assistant.            Pietro Cassis Alvan Dame, M.D.        11/06/2020 9:33 AM

## 2020-11-06 NOTE — Anesthesia Procedure Notes (Signed)
Procedure Name: LMA Insertion Date/Time: 11/06/2020 8:21 AM Performed by: Jonna Munro, CRNA Pre-anesthesia Checklist: Patient identified, Emergency Drugs available, Suction available, Patient being monitored and Timeout performed Patient Re-evaluated:Patient Re-evaluated prior to induction Oxygen Delivery Method: Circle system utilized Preoxygenation: Pre-oxygenation with 100% oxygen Induction Type: IV induction LMA: LMA inserted LMA Size: 4.0 Number of attempts: 1 Placement Confirmation: positive ETCO2,  ETT inserted through vocal cords under direct vision and breath sounds checked- equal and bilateral Tube secured with: Tape Dental Injury: Teeth and Oropharynx as per pre-operative assessment

## 2020-11-07 ENCOUNTER — Encounter (HOSPITAL_COMMUNITY): Payer: Self-pay | Admitting: Orthopedic Surgery

## 2020-11-07 DIAGNOSIS — Z7901 Long term (current) use of anticoagulants: Secondary | ICD-10-CM | POA: Diagnosis not present

## 2020-11-07 DIAGNOSIS — Z96611 Presence of right artificial shoulder joint: Secondary | ICD-10-CM | POA: Diagnosis not present

## 2020-11-07 DIAGNOSIS — Z96652 Presence of left artificial knee joint: Secondary | ICD-10-CM | POA: Diagnosis not present

## 2020-11-07 DIAGNOSIS — Z96612 Presence of left artificial shoulder joint: Secondary | ICD-10-CM | POA: Diagnosis not present

## 2020-11-07 DIAGNOSIS — M1612 Unilateral primary osteoarthritis, left hip: Secondary | ICD-10-CM | POA: Diagnosis not present

## 2020-11-07 LAB — CBC
HCT: 36.5 % — ABNORMAL LOW (ref 39.0–52.0)
Hemoglobin: 12.2 g/dL — ABNORMAL LOW (ref 13.0–17.0)
MCH: 32.4 pg (ref 26.0–34.0)
MCHC: 33.4 g/dL (ref 30.0–36.0)
MCV: 96.8 fL (ref 80.0–100.0)
Platelets: 193 10*3/uL (ref 150–400)
RBC: 3.77 MIL/uL — ABNORMAL LOW (ref 4.22–5.81)
RDW: 12.7 % (ref 11.5–15.5)
WBC: 9 10*3/uL (ref 4.0–10.5)
nRBC: 0 % (ref 0.0–0.2)

## 2020-11-07 LAB — BASIC METABOLIC PANEL
Anion gap: 8 (ref 5–15)
BUN: 11 mg/dL (ref 8–23)
CO2: 27 mmol/L (ref 22–32)
Calcium: 8.8 mg/dL — ABNORMAL LOW (ref 8.9–10.3)
Chloride: 101 mmol/L (ref 98–111)
Creatinine, Ser: 0.74 mg/dL (ref 0.61–1.24)
GFR, Estimated: >60 mL/min (ref >60)
Glucose, Bld: 130 mg/dL — ABNORMAL HIGH (ref 70–99)
Potassium: 3.9 mmol/L (ref 3.5–5.1)
Sodium: 136 mmol/L (ref 135–145)

## 2020-11-07 MED ORDER — HYDROCODONE-ACETAMINOPHEN 5-325 MG PO TABS: 1.0000 | ORAL_TABLET | Freq: Four times a day (QID) | ORAL | 0 refills | Status: DC | PRN

## 2020-11-07 NOTE — Progress Notes (Signed)
   Subjective: 1 Day Post-Op Procedure(s) (LRB): TOTAL HIP ARTHROPLASTY ANTERIOR APPROACH (Left) Patient reports pain as mild.   Patient seen in rounds for Dr. Alvan Dame. Patient is resting in bed this morning on exam. He reports his biggest issue has been with urinary symptoms. He has a history of frequent UTIs and was recently on ABX for this. He reports significant dysuria, which has somewhat improved this morning. Otherwise, he is doing well and is motivated to get home today.  We will continue therapy today.   Objective: Vital signs in last 24 hours: Temp:  [97.6 F (36.4 C)-99.1 F (37.3 C)] 98.7 F (37.1 C) (04/20 0504) Pulse Rate:  [62-88] 81 (04/20 0504) Resp:  [12-18] 18 (04/20 0504) BP: (101-118)/(51-79) 109/61 (04/20 0504) SpO2:  [90 %-100 %] 95 % (04/20 0504)  Intake/Output from previous day:  Intake/Output Summary (Last 24 hours) at 11/07/2020 0846 Last data filed at 11/07/2020 0500 Gross per 24 hour  Intake 2366.49 ml  Output 2075 ml  Net 291.49 ml     Intake/Output this shift: No intake/output data recorded.  Labs: Recent Labs    11/07/20 0247  HGB 12.2*   Recent Labs    11/07/20 0247  WBC 9.0  RBC 3.77*  HCT 36.5*  PLT 193   Recent Labs    11/07/20 0247  NA 136  K 3.9  CL 101  CO2 27  BUN 11  CREATININE 0.74  GLUCOSE 130*  CALCIUM 8.8*   No results for input(s): LABPT, INR in the last 72 hours.  Exam: General - Patient is Alert and Oriented Extremity - Neurologically intact Sensation intact distally Intact pulses distally Dorsiflexion/Plantar flexion intact Dressing - dressing C/D/I Motor Function - intact, moving foot and toes well on exam.   Past Medical History:  Diagnosis Date  . Arthritis   . BPH (benign prostatic hyperplasia)   . Complication of anesthesia   . DVT (deep venous thrombosis) (HCC)    bil calf  . ED (erectile dysfunction)   . Foot mass, left    left plantar forefoot  . History of colon polyps   . History of  kidney stones   . Internal hemorrhoids   . Pneumonia    hx of   . PONV (postoperative nausea and vomiting)   . Seasonal allergies     Assessment/Plan: 1 Day Post-Op Procedure(s) (LRB): TOTAL HIP ARTHROPLASTY ANTERIOR APPROACH (Left) Active Problems:   S/P left total hip arthroplasty  Estimated body mass index is 21.12 kg/m as calculated from the following:   Height as of this encounter: 5\' 9"  (1.753 m).   Weight as of this encounter: 64.9 kg. Advance diet Up with therapy D/C IV fluids  DVT Prophylaxis - Aspirin and Xarelto Weight bearing as tolerated.  Plan is to go Home after hospital stay. Plan for discharge home today after 1-2 sessions of therapy as long as he is meeting his goals. Most of his meds were sent pre-operatively, but I will add in some Hydrocodone as he has needed it here more than expected.   In terms of his urinary symptoms, I offered to send his urine for dipstick/culture which he declines. He states he will follow up with his regular Urologist upon discharge. He has antibiotics at home for when he feels UTI symptoms, and he may take these if needed at home. He is urinating here without obvious hematuria.   Griffith Citron, PA-C Orthopedic Surgery [phone] 11/07/2020, 8:46 AM

## 2020-11-07 NOTE — Progress Notes (Signed)
Physical Therapy Treatment Patient Details Name: Nicholas Terry MRN: [id number] DOB: [birth year] Today's Date: 11/07/2020    History of Present Illness Patient is 70 y.o. male s/p Lt THA anterior approach on 11/06/20 with PMH significant for BPH, OA, DVT, Bil TSA.    PT Comments    POD # 1 am session Assisted OOB.  Demonstrated and instructed pt how to use belt to self assist LE as a leg lifter Assisted with amb in hallway. General Gait Details: pt able to tolerated an increased distance with no c/o's.  Had significant other assist "hands on" with instructions on safe handling.  Then returned to room to perform some TE's following HEP handout.  Instructed on proper tech, freq as well as use of ICE.   Pt will need another PT session to address stairs.   Follow Up Recommendations  Follow surgeon's recommendation for DC plan and follow-up therapies     Equipment Recommendations  None recommended by PT    Recommendations for Other Services       Precautions / Restrictions Precautions Precautions: Fall Restrictions Weight Bearing Restrictions: No Other Position/Activity Restrictions: WBAT    Mobility  Bed Mobility Overal bed mobility: Needs Assistance Bed Mobility: Supine to Sit     Supine to sit: Supervision     General bed mobility comments: demonstarted and instructed how to use belt to self assist LE off bed able to self perform with increased time    Transfers Overall transfer level: Needs assistance Equipment used: Rolling walker (2 wheeled) Transfers: Sit to/from Stand Sit to Stand: Supervision         General transfer comment: 25% VC's on proper hand placement and safety with turns  Ambulation/Gait Ambulation/Gait assistance: Supervision Gait Distance (Feet): 55 Feet Assistive device: Rolling walker (2 wheeled) Gait Pattern/deviations: Step-to pattern;Decreased stride length;Decreased weight shift to left Gait velocity: decr   General Gait Details: pt  able to tolerated an increased distance with no c/o's.  Had significant other assist "hands on" with instructions on safe handling   Stairs             Wheelchair Mobility    Modified Rankin (Stroke Patients Only)       Balance                                            Cognition Arousal/Alertness: Awake/alert Behavior During Therapy: WFL for tasks assessed/performed Overall Cognitive Status: Within Functional Limits for tasks assessed                                 General Comments: AxO x 3 very motivated      Exercises   Total Hip Replacement TE's following HEP Handout 10 reps ankle pumps 05 reps knee presses 05 reps heel slides 05 reps SAQ's 05 reps ABD Instructed how to use a belt loop to assist  Followed by ICE    General Comments        Pertinent Vitals/Pain Pain Assessment: 0-10 Pain Score: 5  Pain Location: Lt hip Pain Descriptors / Indicators: Aching;Discomfort;Sore;Tightness Pain Intervention(s): Monitored during session;Premedicated before session;Repositioned;Ice applied    Home Living                      Prior Function  PT Goals (current goals can now be found in the care plan section) Progress towards PT goals: Progressing toward goals    Frequency    7X/week      PT Plan Current plan remains appropriate    Co-evaluation              AM-PAC PT "6 Clicks" Mobility   Outcome Measure  Help needed turning from your back to your side while in a flat bed without using bedrails?: A Little Help needed moving from lying on your back to sitting on the side of a flat bed without using bedrails?: A Little Help needed moving to and from a bed to a chair (including a wheelchair)?: A Little Help needed standing up from a chair using your arms (e.g., wheelchair or bedside chair)?: A Little Help needed to walk in hospital room?: A Little Help needed climbing 3-5 steps with a  railing? : A Little 6 Click Score: 18    End of Session Equipment Utilized During Treatment: Gait belt Activity Tolerance: Patient tolerated treatment well Patient left: in chair;with call bell/phone within reach;with family/visitor present Nurse Communication: Mobility status PT Visit Diagnosis: Muscle weakness (generalized) (M62.81);Difficulty in walking, not elsewhere classified (R26.2);Pain Pain - Right/Left: Left Pain - part of body: Hip     Time: 1005-1030 PT Time Calculation (min) (ACUTE ONLY): 25 min  Charges:  $Gait Training: 8-22 mins $Therapeutic Exercise: 8-22 mins                     Rica Koyanagi  PTA Acute  Rehabilitation Services Pager      [phone] Office      [phone]

## 2020-11-07 NOTE — Progress Notes (Signed)
Patient discharged to home w/ family. Given all belongings, instructions. Verbalized understanding of instructions. Escorted to pov via w/c. 

## 2020-11-07 NOTE — TOC Transition Note (Signed)
Transition of Care [facility]) - CM/SW Discharge Note   Patient Details  Name: Nicholas Terry MRN: [id number] Date of Birth: [birth year]  Transition of Care [facility]) CM/SW Contact:  Lennart Pall, LCSW Phone Number: 11/07/2020, 10:19 AM   Clinical Narrative:    Met briefly with pt and wife and confirming he has all needed DME.  Plan HEP for home.  No TOC needs.   Final next level of care: Home/Self Care Barriers to Discharge: No Barriers Identified   Patient Goals and CMS Choice Patient states their goals for this hospitalization and ongoing recovery are:: return home      Discharge Placement                       Discharge Plan and Services                DME Arranged: N/A                    Social Determinants of Health (SDOH) Interventions     Readmission Risk Interventions No flowsheet data found.

## 2020-11-07 NOTE — Anesthesia Postprocedure Evaluation (Signed)
Anesthesia Post Note  [name]  Procedure(s) Performed: TOTAL HIP ARTHROPLASTY ANTERIOR APPROACH (Left Hip)     Patient location during evaluation: PACU Anesthesia Type: General Level of consciousness: awake and alert Pain management: pain level controlled Vital Signs Assessment: post-procedure vital signs reviewed and stable Respiratory status: spontaneous breathing, nonlabored ventilation, respiratory function stable and patient connected to nasal cannula oxygen Cardiovascular status: blood pressure returned to baseline and stable Postop Assessment: no apparent nausea or vomiting Anesthetic complications: no   No complications documented.  Last Vitals:  Vitals:   11/07/20 1052 11/07/20 1332  BP: 95/69 107/72  Pulse: 74 79  Resp: 18 16  Temp: 37.3 C 36.7 C  SpO2: 96% 96%    Last Pain:  Vitals:   11/07/20 1052  TempSrc: Oral  PainSc:                  Sheree Lalla

## 2020-11-07 NOTE — Progress Notes (Signed)
Physical Therapy Treatment Patient Details Name: Nicholas Terry MRN: [id number] DOB: 1951-02-07 Today's Date: 11/07/2020    History of Present Illness Patient is 70 y.o. male s/p Lt THA anterior approach on 11/06/20 with PMH significant for BPH, OA, DVT, Bil TSA.    PT Comments    POD # 1 pm session Assisted with amb a greater distance in hallway to gym.  Practiced stairs. General stair comments: with significant other for "hands on" instruction on safe handling and proper sequencing stair training.  Up and Down using one rail but holding with bother hands . Addressed all mobility questions, discussed appropriate activity, educated on use of ICE.  Pt ready for D/C to home.    Follow Up Recommendations  Follow surgeon's recommendation for DC plan and follow-up therapies     Equipment Recommendations  None recommended by PT    Recommendations for Other Services       Precautions / Restrictions Precautions Precautions: Fall Restrictions Weight Bearing Restrictions: No Other Position/Activity Restrictions: WBAT    Mobility  Bed Mobility     General bed mobility comments: OOB in recliner    Transfers Overall transfer level: Needs assistance Equipment used: Rolling walker (2 wheeled) Transfers: Sit to/from Stand Sit to Stand: Supervision         General transfer comment: 25% VC's on proper hand placement and safety with turns  Ambulation/Gait Ambulation/Gait assistance: Supervision Gait Distance (Feet): 175 Feet Assistive device: Rolling walker (2 wheeled) Gait Pattern/deviations: Step-to pattern;Decreased stride length;Decreased weight shift to left Gait velocity: decr   General Gait Details: pt able to tolerated an increased distance with no c/o's.  Had significant other assist "hands on" with instructions on safe handling   Stairs Stairs: Yes Stairs assistance: Min guard Stair Management: One rail Left;Step to pattern;Forwards Number of Stairs: 6 General  stair comments: with significant other for "hands on" instruction on safe handling and proper sequencing stair training.  Up and Down using one rail but holding with bother hands .   Wheelchair Mobility    Modified Rankin (Stroke Patients Only)       Balance                                            Cognition Arousal/Alertness: Awake/alert Behavior During Therapy: WFL for tasks assessed/performed Overall Cognitive Status: Within Functional Limits for tasks assessed                                 General Comments: AxO x 3 very motivated      Exercises      General Comments        Pertinent Vitals/Pain Pain Assessment: 0-10 Pain Score: 5  Pain Location: Lt hip Pain Descriptors / Indicators: Aching;Discomfort;Sore;Tightness Pain Intervention(s): Monitored during session;Premedicated before session;Repositioned;Ice applied    Home Living                      Prior Function            PT Goals (current goals can now be found in the care plan section) Progress towards PT goals: Progressing toward goals    Frequency    7X/week      PT Plan Current plan remains appropriate    Co-evaluation  AM-PAC PT "6 Clicks" Mobility   Outcome Measure  Help needed turning from your back to your side while in a flat bed without using bedrails?: A Little Help needed moving from lying on your back to sitting on the side of a flat bed without using bedrails?: A Little Help needed moving to and from a bed to a chair (including a wheelchair)?: A Little Help needed standing up from a chair using your arms (e.g., wheelchair or bedside chair)?: A Little Help needed to walk in hospital room?: A Little Help needed climbing 3-5 steps with a railing? : A Little 6 Click Score: 18    End of Session Equipment Utilized During Treatment: Gait belt Activity Tolerance: Patient tolerated treatment well Patient left: in  chair;with call bell/phone within reach;with family/visitor present Nurse Communication: Mobility status PT Visit Diagnosis: Muscle weakness (generalized) (M62.81);Difficulty in walking, not elsewhere classified (R26.2);Pain Pain - Right/Left: Left Pain - part of body: Hip     Time: 1325-1350 PT Time Calculation (min) (ACUTE ONLY): 25 min  Charges:  $Gait Training: 8-22 mins $Therapeutic Activity: 8-22 mins                     Rica Koyanagi  PTA Acute  Rehabilitation Services Pager      [phone] Office      [phone]

## 2020-11-14 NOTE — Discharge Summary (Signed)
Physician Discharge Summary   Patient ID: Nicholas Terry MRN: [id number] DOB/AGE: 1950-08-21 70 y.o.  Admit date: 11/06/2020 Discharge date: 11/07/2020  Primary Diagnosis: Left  hip osteoarthritis.  Admission Diagnoses:  Past Medical History:  Diagnosis Date  . Arthritis   . BPH (benign prostatic hyperplasia)   . Complication of anesthesia   . DVT (deep venous thrombosis) (HCC)    bil calf  . ED (erectile dysfunction)   . Foot mass, left    left plantar forefoot  . History of colon polyps   . History of kidney stones   . Internal hemorrhoids   . Pneumonia    hx of   . PONV (postoperative nausea and vomiting)   . Seasonal allergies    Discharge Diagnoses:   Active Problems:   S/P left total hip arthroplasty  Estimated body mass index is 21.12 kg/m as calculated from the following:   Height as of this encounter: 5\' 9"  (1.753 m).   Weight as of this encounter: 64.9 kg.  Procedure:  Procedure(s) (LRB): TOTAL HIP ARTHROPLASTY ANTERIOR APPROACH (Left)   Consults: None  HPI:  Nicholas Terry is a 70 y.o. male who had   presented to office for evaluation of left hip pain.  Radiographs revealed   progressive degenerative changes with bone-on-bone   articulation of the  hip joint, including subchondral cystic changes and osteophytes.  The patient had painful limited range of   motion significantly affecting their overall quality of life and function.  The patient was failing to    respond to conservative measures including medications and/or injections and activity modification and at this point was ready   to proceed with more definitive measures.  Consent was obtained for   benefit of pain relief.  Specific risks of infection, DVT, component   failure, dislocation, neurovascular injury, and need for revision surgery were reviewed in the office as well discussion of   the anterior versus posterior approach were reviewed.  Laboratory Data: Admission on 11/06/2020,  Discharged on 11/07/2020  Component Date Value Ref Range Status  . ABO/RH(D) 11/06/2020    Final                   Value:A NEG Performed at [facility], [place] [address], [place], [place] [address]   . WBC 11/07/2020 9.0  4.0 - 10.5 K/uL Final  . RBC 11/07/2020 3.77* 4.22 - 5.81 MIL/uL Final  . Hemoglobin 11/07/2020 12.2* 13.0 - 17.0 g/dL Final  . HCT 11/07/2020 36.5* 39.0 - 52.0 % Final  . MCV 11/07/2020 96.8  80.0 - 100.0 fL Final  . MCH 11/07/2020 32.4  26.0 - 34.0 pg Final  . MCHC 11/07/2020 33.4  30.0 - 36.0 g/dL Final  . RDW 11/07/2020 12.7  11.5 - 15.5 % Final  . Platelets 11/07/2020 193  150 - 400 K/uL Final  . nRBC 11/07/2020 0.0  0.0 - 0.2 % Final   Performed at [facility], [place] [address], [place], [place] [address]  . Sodium 11/07/2020 136  135 - 145 mmol/L Final  . Potassium 11/07/2020 3.9  3.5 - 5.1 mmol/L Final  . Chloride 11/07/2020 101  98 - 111 mmol/L Final  . CO2 11/07/2020 27  22 - 32 mmol/L Final  . Glucose, Bld 11/07/2020 130* 70 - 99 mg/dL Final   Glucose reference range applies only to samples taken after fasting for at least 8 hours.  . BUN 11/07/2020 11  8 -  23 mg/dL Final  . Creatinine, Ser 11/07/2020 0.74  0.61 - 1.24 mg/dL Final  . Calcium 11/07/2020 8.8* 8.9 - 10.3 mg/dL Final  . GFR, Estimated 11/07/2020 >60  >60 mL/min Final   Comment: (NOTE) Calculated using the CKD-EPI Creatinine Equation (2021)   . Anion gap 11/07/2020 8  5 - 15 Final   Performed at [facility], [place] [address] [place], [place] [address]  Hospital Outpatient Visit on 11/02/2020  Component Date Value Ref Range Status  . SARS Coronavirus 2 11/02/2020 NEGATIVE  NEGATIVE Final   Comment: (NOTE) SARS-CoV-2 target nucleic acids are NOT DETECTED.  The SARS-CoV-2 RNA is generally detectable in upper and lower respiratory specimens during the acute phase of infection. Negative results do not preclude SARS-CoV-2 infection, do  not rule out co-infections with other pathogens, and should not be used as the sole basis for treatment or other patient management decisions. Negative results must be combined with clinical observations, patient history, and epidemiological information. The expected result is Negative.  Fact Sheet for Patients: SugarRoll.be  Fact Sheet for Healthcare Providers: https://www.woods-mathews.com/  This test is not yet approved or cleared by the [place] FDA and  has been authorized for detection and/or diagnosis of SARS-CoV-2 by FDA under an Emergency Use Authorization (EUA). This EUA will remain  in effect (meaning this test can be used) for the duration of the COVID-19 declaration under Se                          ction 564(b)(1) of the Act, 21 U.S.C. section 360bbb-3(b)(1), unless the authorization is terminated or revoked sooner.  Performed at [facility], [place] [address], [place], [place] [address]   Hospital Outpatient Visit on 10/26/2020  Component Date Value Ref Range Status  . MRSA, PCR 10/26/2020 NEGATIVE  NEGATIVE Final  . Staphylococcus aureus 10/26/2020 NEGATIVE  NEGATIVE Final   Comment: (NOTE) The Xpert SA Assay (FDA approved for NASAL specimens in patients 70 years of age and older), is one component of a comprehensive surveillance program. It is not intended to diagnose infection nor to guide or monitor treatment. Performed at [facility], [place] [address] [place], [place] [address]   . ABO/RH(D) 10/26/2020 A NEG   Final  . Antibody Screen 10/26/2020 NEG   Final  . Sample Expiration 10/26/2020 11/09/2020,2359   Final  . Extend sample reason 10/26/2020    Final                   Value:NO TRANSFUSIONS OR PREGNANCY IN THE PAST 3 MONTHS Performed at [place] [address], [place], [place] [address]   . WBC 10/26/2020 7.8  4.0 - 10.5 K/uL Final  . RBC 10/26/2020 4.47   4.22 - 5.81 MIL/uL Final  . Hemoglobin 10/26/2020 14.6  13.0 - 17.0 g/dL Final  . HCT 10/26/2020 44.2  39.0 - 52.0 % Final  . MCV 10/26/2020 98.9  80.0 - 100.0 fL Final  . MCH 10/26/2020 32.7  26.0 - 34.0 pg Final  . MCHC 10/26/2020 33.0  30.0 - 36.0 g/dL Final  . RDW 10/26/2020 13.1  11.5 - 15.5 % Final  . Platelets 10/26/2020 232  150 - 400 K/uL Final  . nRBC 10/26/2020 0.0  0.0 - 0.2 % Final   Performed at [facility], [place] [address] [place], [place] [address]     X-Rays:DG C-Arm 1-60 Min-No  Report  Result Date: 11/06/2020 CLINICAL DATA:  Left hip replacement. EXAM: OPERATIVE left HIP (WITH PELVIS IF PERFORMED) 4 VIEWS TECHNIQUE: Fluoroscopic spot image(s) were submitted for interpretation post-operatively. COMPARISON:  No prior. FINDINGS: Postsurgical changes of left hip replacement noted. Visualized hardware intact with anatomic alignment. Four views obtained. IMPRESSION: Postsurgical changes of left hip replacement. Visualized hardware intact with anatomic alignment. Electronically Signed   By: Marcello Moores  Register   On: 11/06/2020 12:09   DG HIP OPERATIVE UNILAT W OR W/O PELVIS LEFT  Result Date: 11/06/2020 CLINICAL DATA:  Left hip replacement. EXAM: OPERATIVE left HIP (WITH PELVIS IF PERFORMED) 4 VIEWS TECHNIQUE: Fluoroscopic spot image(s) were submitted for interpretation post-operatively. COMPARISON:  No prior. FINDINGS: Postsurgical changes of left hip replacement noted. Visualized hardware intact with anatomic alignment. Four views obtained. IMPRESSION: Postsurgical changes of left hip replacement. Visualized hardware intact with anatomic alignment. Electronically Signed   By: Marcello Moores  Register   On: 11/06/2020 12:09    EKG: Orders placed or performed during the hospital encounter of 05/09/20  . EKG 12 lead per protocol  . EKG 12 lead per protocol     Hospital Course: Nicholas Terry is a 70 y.o. who was admitted to [facility]. They were brought to  the operating room on 11/06/2020 and underwent Procedure(s): [place].  Patient tolerated the procedure well and was later transferred to the recovery room and then to the orthopaedic floor for postoperative care. They were given PO and IV analgesics for pain control following their surgery. They were given 24 hours of postoperative antibiotics of  Anti-infectives (From admission, onward)   Start     Dose/Rate Route Frequency Ordered Stop   11/06/20 1452  ceFAZolin (ANCEF) 2-4 GM/100ML-% IVPB       Note to Pharmacy: Clovia Cuff   : cabinet override      11/06/20 1452 11/07/20 0259   11/06/20 1400  ceFAZolin (ANCEF) IVPB 2g/100 mL premix        2 g 200 mL/hr over 30 Minutes Intravenous Every 6 hours 11/06/20 1010 11/06/20 1955   11/06/20 0645  ceFAZolin (ANCEF) IVPB 2g/100 mL premix        2 g 200 mL/hr over 30 Minutes Intravenous On call to O.R. 11/06/20 [id number] 11/06/20 0818     and started on DVT prophylaxis in the form of Xarelto.   PT and OT were ordered for total joint protocol. Discharge planning consulted to help with postop disposition and equipment needs.  Patient had a good night on the evening of surgery. They started to get up OOB with therapy on POD #1. Pt was seen during rounds and was ready to go home pending progress with therapy.He worked with therapy on POD #1 and was meeting his goals. Pt was discharged to home later that day in stable condition.  Diet: Regular diet Activity: WBAT Follow-up: in 2 weeks Disposition: Home Discharged Condition: good    Allergies as of 11/07/2020      Reactions   Sulfa Antibiotics Rash      Medication List    TAKE these medications   acetaminophen 650 MG CR tablet Commonly known as: TYLENOL Take 650 mg by mouth daily.   APPLE CIDER VINEGAR PO Take 1 each by mouth 2 (two) times daily.   HYDROcodone-acetaminophen 5-325 MG tablet Commonly known as: NORCO/VICODIN Take 1-2 tablets by mouth every 6  (six) hours as needed for severe pain (pain score 7-10).   hydrocortisone 2.5 %  rectal cream Commonly known as: ANUSOL-HC APPLY RECTALLY TO THE AFFECTED AREA AT BEDTIME What changed: See the new instructions.   loratadine 10 MG tablet Commonly known as: CLARITIN Take 10 mg by mouth daily.   multivitamin with minerals Tabs tablet Take 1 tablet by mouth daily.   OVER THE COUNTER MEDICATION Apply 1 application topically daily as needed (joint pain). cbd cream   OVER THE COUNTER MEDICATION Take 0.5 capsules by mouth daily as needed (pain). cbd gummies   REFRESH OPTIVE OP Place 1 drop into both eyes daily as needed (dry eyes).   SAW PALMETTO PO Take 1 tablet by mouth daily.   traMADol 50 MG tablet Commonly known as: ULTRAM Take 1 tablet (50 mg total) by mouth every 6 (six) hours as needed for moderate pain.   Xarelto 10 MG Tabs tablet Generic drug: rivaroxaban Take 10 mg by mouth daily.       Follow-up Information    Paralee Cancel, MD. Go on 11/21/2020.   Specialty: Orthopedic Surgery Why: You are scheduled for first post op appointment on Wednesday May 4th at 9:15am. Contact information: [address] [place] [address] [place] [address] [id number]               Signed: Griffith Citron, PA-C Orthopedic Surgery 11/14/2020, 9:05 AM

## 2020-11-19 ENCOUNTER — Encounter: Payer: Medicare PPO | Admitting: Internal Medicine

## 2020-11-23 ENCOUNTER — Encounter: Payer: Self-pay | Admitting: *Deleted

## 2020-11-27 ENCOUNTER — Other Ambulatory Visit: Payer: Self-pay

## 2020-11-27 ENCOUNTER — Ambulatory Visit (INDEPENDENT_AMBULATORY_CARE_PROVIDER_SITE_OTHER): Payer: Medicare PPO | Admitting: Internal Medicine

## 2020-11-27 ENCOUNTER — Encounter: Payer: Self-pay | Admitting: Internal Medicine

## 2020-11-27 VITALS — BP 100/66 | HR 103 | Ht 69.0 in | Wt 140.0 lb

## 2020-11-27 DIAGNOSIS — K648 Other hemorrhoids: Secondary | ICD-10-CM | POA: Diagnosis not present

## 2020-11-27 NOTE — Progress Notes (Signed)
Nicholas Terry is a 70 year old male with a history of symptomatic internal hemorrhoids.  He returns for additional hemorrhoidal banding. Initial hemorrhoidal banding on 10/25/2020 without interruption of Xarelto  He did well with the initial hemorrhoidal banding  Symptoms prior to banding: Prolapse   PROCEDURE NOTE:  The patient presents with symptomatic grade 3 internal hemorrhoids, requesting rubber band ligation of Nicholas Terry her hemorrhoidal disease.  All risks, benefits and alternative forms of therapy were described and informed consent was obtained.  We specifically discussed the higher than baseline risk for bleeding in the setting of anticoagulation, in Nicholas Terry case Xarelto.  There is risk of post banding hemorrhage that can be present up to 2 weeks after hemorrhoidal banding.  The risk of discontinuation of anticoagulation for 2 weeks after banding may exceed the risk of continuing therapy.  After this discussion he wishes to proceed.  The anorectum was pre-medicated with 0.125% nitroglycerin ointment The decision was made to band the LL (RA banded initially) internal hemorrhoid, and the Metolius was used to perform band ligation without complication.   Digital anorectal examination was then performed to assure proper positioning of the band, and to adjust the banded tissue as required.  The patient was discharged home without pain or other issues.  Dietary and behavioral recommendations were given and along with follow-up instructions.     The patient will return as scheduled for follow-up and possible additional banding as required. No complications were encountered and the patient tolerated the procedure well.

## 2020-11-27 NOTE — Patient Instructions (Signed)
You are scheduled for your 3rd banding on 01/17/21 at 4:00 pm.  HEMORRHOID BANDING PROCEDURE    FOLLOW-UP CARE   1. The procedure you have had should have been relatively painless since the banding of the area involved does not have nerve endings and there is no pain sensation.  The rubber band cuts off the blood supply to the hemorrhoid and the band may fall off as soon as 48 hours after the banding (the band may occasionally be seen in the toilet bowl following a bowel movement). You may notice a temporary feeling of fullness in the rectum which should respond adequately to plain Tylenol or Motrin.  2. Following the banding, avoid strenuous exercise that evening and resume full activity the next day.  A sitz bath (soaking in a warm tub) or bidet is soothing, and can be useful for cleansing the area after bowel movements.     3. To avoid constipation, take two tablespoons of natural wheat bran, natural oat bran, flax, Benefiber or any over the counter fiber supplement and increase your water intake to 7-8 glasses daily.    4. Unless you have been prescribed anorectal medication, do not put anything inside your rectum for two weeks: No suppositories, enemas, fingers, etc.  5. Occasionally, you may have more bleeding than usual after the banding procedure.  This is often from the untreated hemorrhoids rather than the treated one.  Don't be concerned if there is a tablespoon or so of blood.  If there is more blood than this, lie flat with your bottom higher than your head and apply an ice pack to the area. If the bleeding does not stop within a half an hour or if you feel faint, call our office at [phone] or go to the emergency room.  6. Problems are not common; however, if there is a substantial amount of bleeding, severe pain, chills, fever or difficulty passing urine (very rare) or other problems, you should call us at [phone] or report to the nearest emergency room.  7. Do not  stay seated continuously for more than 2-3 hours for a day or two after the procedure.  Tighten your buttock muscles 10-15 times every two hours and take 10-15 deep breaths every 1-2 hours.  Do not spend more than a few minutes on the toilet if you cannot empty your bowel; instead re-visit the toilet at a later time.

## 2020-12-13 DIAGNOSIS — H40021 Open angle with borderline findings, high risk, right eye: Secondary | ICD-10-CM | POA: Diagnosis not present

## 2020-12-13 DIAGNOSIS — H40012 Open angle with borderline findings, low risk, left eye: Secondary | ICD-10-CM | POA: Diagnosis not present

## 2020-12-19 DIAGNOSIS — M1711 Unilateral primary osteoarthritis, right knee: Secondary | ICD-10-CM | POA: Diagnosis not present

## 2020-12-19 DIAGNOSIS — Z96642 Presence of left artificial hip joint: Secondary | ICD-10-CM | POA: Diagnosis not present

## 2020-12-19 DIAGNOSIS — Z471 Aftercare following joint replacement surgery: Secondary | ICD-10-CM | POA: Diagnosis not present

## 2021-01-17 ENCOUNTER — Encounter: Payer: Self-pay | Admitting: Internal Medicine

## 2021-01-17 ENCOUNTER — Ambulatory Visit (INDEPENDENT_AMBULATORY_CARE_PROVIDER_SITE_OTHER): Payer: Medicare PPO | Admitting: Internal Medicine

## 2021-01-17 VITALS — BP 102/60 | HR 69 | Ht 69.0 in | Wt 138.0 lb

## 2021-01-17 DIAGNOSIS — Z7901 Long term (current) use of anticoagulants: Secondary | ICD-10-CM | POA: Diagnosis not present

## 2021-01-17 DIAGNOSIS — K648 Other hemorrhoids: Secondary | ICD-10-CM

## 2021-01-17 NOTE — Patient Instructions (Addendum)
Please follow up with Dr Henrene Pastor as needed.  HEMORRHOID BANDING PROCEDURE    FOLLOW-UP CARE   The procedure you have had should have been relatively painless since the banding of the area involved does not have nerve endings and there is no pain sensation.  The rubber band cuts off the blood supply to the hemorrhoid and the band may fall off as soon as 48 hours after the banding (the band may occasionally be seen in the toilet bowl following a bowel movement). You may notice a temporary feeling of fullness in the rectum which should respond adequately to plain Tylenol or Motrin.  Following the banding, avoid strenuous exercise that evening and resume full activity the next day.  A sitz bath (soaking in a warm tub) or bidet is soothing, and can be useful for cleansing the area after bowel movements.     To avoid constipation, take two tablespoons of natural wheat bran, natural oat bran, flax, Benefiber or any over the counter fiber supplement and increase your water intake to 7-8 glasses daily.    Unless you have been prescribed anorectal medication, do not put anything inside your rectum for two weeks: No suppositories, enemas, fingers, etc.  Occasionally, you may have more bleeding than usual after the banding procedure.  This is often from the untreated hemorrhoids rather than the treated one.  Don't be concerned if there is a tablespoon or so of blood.  If there is more blood than this, lie flat with your bottom higher than your head and apply an ice pack to the area. If the bleeding does not stop within a half an hour or if you feel faint, call our office at [phone] or go to the emergency room.  Problems are not common; however, if there is a substantial amount of bleeding, severe pain, chills, fever or difficulty passing urine (very rare) or other problems, you should call us at [phone] or report to the nearest emergency room.  Do not stay seated continuously for more than  2-3 hours for a day or two after the procedure.  Tighten your buttock muscles 10-15 times every two hours and take 10-15 deep breaths every 1-2 hours.  Do not spend more than a few minutes on the toilet if you cannot empty your bowel; instead re-visit the toilet at a later time.   If you are age 21 or older, your body mass index should be between 23-30. Your Body mass index is 20.38 kg/m. If this is out of the aforementioned range listed, please consider follow up with your Primary Care Provider. ___________________________________  The [facility] providers would like to encourage you to use [facility] to communicate with providers for non-urgent requests or questions.  Due to long hold times on the telephone, sending your provider a message by [facility] may be a faster and more efficient way to get a response.  Please allow 48 business hours for a response.  Please remember that this is for non-urgent requests.   Due to recent changes in healthcare laws, you may see the results of your imaging and laboratory studies on MyChart before your provider has had a chance to review them.  We understand that in some cases there may be results that are confusing or concerning to you. Not all laboratory results come back in the same time frame and the provider may be waiting for multiple results in order to interpret others.  Please give [place] 48 hours in order for  your provider to thoroughly review all the results before contacting the office for clarification of your results.

## 2021-01-17 NOTE — Progress Notes (Signed)
Nicholas Terry is a 70 year old male with a history of symptomatic internal hemorrhoids who returns for additional hemorrhoidal banding. Initial hemorrhoidal banding performed on 10/25/2020 and 11/27/2020 without interruption of Xarelto.  He did well with initial hemorrhoidal banding. However he continues to have intermittent prolapse notable after bowel movement which he reduces usually during showering.  Symptoms prior to banding: Prolapse   PROCEDURE NOTE:  The patient presents with symptomatic grade 3 internal hemorrhoids, requesting rubber band ligation of his hemorrhoidal disease.  All risks, benefits and alternative forms of therapy were described and informed consent was obtained.   The anorectum was pre-medicated with 0.125% nitroglycerin ointment The decision was made to band the RP (LL and RA banded previously) internal hemorrhoid, and the [place] was used to perform band ligation without complication.   Digital anorectal examination was then performed to assure proper positioning of the band, and to adjust the banded tissue as required.  The patient was discharged home without pain or other issues.  Dietary and behavioral recommendations were given and along with follow-up instructions.    The patient will return as scheduled for follow-up and possible additional banding as required. No complications were encountered and the patient tolerated the procedure well.  Patient continues to have prolapse after 2 bandings.  Hopefully the third band will relieve this symptom.  If not I asked that he notify me in about a month.  Other options would be endoscopy to identify target for additional banding versus surgical evaluation.

## 2021-02-05 DIAGNOSIS — Z125 Encounter for screening for malignant neoplasm of prostate: Secondary | ICD-10-CM | POA: Diagnosis not present

## 2021-02-05 DIAGNOSIS — Z0000 Encounter for general adult medical examination without abnormal findings: Secondary | ICD-10-CM | POA: Diagnosis not present

## 2021-02-12 DIAGNOSIS — Z1331 Encounter for screening for depression: Secondary | ICD-10-CM | POA: Diagnosis not present

## 2021-02-12 DIAGNOSIS — Z1339 Encounter for screening examination for other mental health and behavioral disorders: Secondary | ICD-10-CM | POA: Diagnosis not present

## 2021-02-12 DIAGNOSIS — N200 Calculus of kidney: Secondary | ICD-10-CM | POA: Diagnosis not present

## 2021-02-12 DIAGNOSIS — N390 Urinary tract infection, site not specified: Secondary | ICD-10-CM | POA: Diagnosis not present

## 2021-02-12 DIAGNOSIS — Z0000 Encounter for general adult medical examination without abnormal findings: Secondary | ICD-10-CM | POA: Diagnosis not present

## 2021-02-12 DIAGNOSIS — M1612 Unilateral primary osteoarthritis, left hip: Secondary | ICD-10-CM | POA: Diagnosis not present

## 2021-02-12 DIAGNOSIS — R972 Elevated prostate specific antigen [PSA]: Secondary | ICD-10-CM | POA: Diagnosis not present

## 2021-02-12 DIAGNOSIS — Z86718 Personal history of other venous thrombosis and embolism: Secondary | ICD-10-CM | POA: Diagnosis not present

## 2021-02-12 DIAGNOSIS — R82998 Other abnormal findings in urine: Secondary | ICD-10-CM | POA: Diagnosis not present

## 2021-02-20 DIAGNOSIS — D0461 Carcinoma in situ of skin of right upper limb, including shoulder: Secondary | ICD-10-CM | POA: Diagnosis not present

## 2021-02-20 DIAGNOSIS — D485 Neoplasm of uncertain behavior of skin: Secondary | ICD-10-CM | POA: Diagnosis not present

## 2021-04-10 ENCOUNTER — Encounter: Payer: Self-pay | Admitting: Internal Medicine

## 2021-04-10 ENCOUNTER — Ambulatory Visit: Payer: Medicare PPO | Admitting: Internal Medicine

## 2021-04-10 VITALS — BP 112/80 | HR 81 | Ht 69.0 in | Wt 139.8 lb

## 2021-04-10 DIAGNOSIS — K648 Other hemorrhoids: Secondary | ICD-10-CM

## 2021-04-10 DIAGNOSIS — L290 Pruritus ani: Secondary | ICD-10-CM | POA: Diagnosis not present

## 2021-04-10 MED ORDER — HYDROCORTISONE (PERIANAL) 2.5 % EX CREA: TOPICAL_CREAM | Freq: Two times a day (BID) | CUTANEOUS | 1 refills | Status: DC

## 2021-04-10 NOTE — Progress Notes (Signed)
Nicholas Terry is a 70 year old male with a history of symptomatic internal hemorrhoids who returns to discuss perianal prolapse symptoms He completed hemorrhoidal banding on 01/17/2021 and over the course of 2-1/2 months between April and June he had banding to the right anterior, left lateral and right posterior internal hemorrhoid.  He tolerated this well and reports symptoms definitely improved with banding. However he has noticed some mild tissue protrusion occurring typically after bowel movement, sometimes with lifting or after a brisk walk.  This can be uncomfortable.  He has had no hemorrhoidal bleeding. Bowel movements have been regular usually 1 or 2 in the morning pitting on his coffee intake. No abdominal pain.  No perianal pain with passing stool. He remains on Xarelto.  Prior banding performed on Xarelto without incident. He is interested in repeat banding if additional hemorrhoidal targets to reduce prolapse He also requests refill of hydrocortisone for perianal itch   PROCEDURE NOTE:  The patient presents with symptomatic grade 2 internal hemorrhoids, requesting rubber band ligation of his hemorrhoidal disease.  All risks, benefits and alternative forms of therapy were described and informed consent was obtained.  He is aware that hemorrhoidal banding without interrupting Xarelto raises the risk of post banding hemorrhage.  ANOSCOPY: Using a disposable, lubricated, slotted, self-illuminating anoscope, the rectum was intubated without difficulty. The trochar was removed and the ano-rectum was circumferentially inspected. There were internal hemorrhoids, LL. There was no finding of an anorectal fissure. The rectal mucosa was not inflamed. No neoplasia or other pathology was identified. The inspection was well tolerated.    The anorectum was pre-medicated with 0.125% nitroglycerin ointment The decision was made to band the LL internal hemorrhoid, and the [place] was used  to perform band ligation without complication.   Digital anorectal examination was then performed to assure proper positioning of the band, and to adjust the banded tissue as required.  The patient was discharged home without pain or other issues.  Dietary and behavioral recommendations were given and along with follow-up instructions.     The following adjunctive treatments were recommended: Refill hydrocortisone 2.5% to be used for perianal itch to the perianal skin on a limited, noncontinuous basis  The patient will return as scheduled for follow-up and possible additional banding as required. No complications were encountered and the patient tolerated the procedure well.  If additional prolapse we may have him see a perianal/rectal surgeon given now banding x4

## 2021-04-10 NOTE — Patient Instructions (Signed)
We have sent the following medications to your pharmacy for you to pick up at your convenience: [place]   The procedure you have had should have been relatively painless since the banding of the area involved does not have nerve endings and there is no pain sensation.  The rubber band cuts off the blood supply to the hemorrhoid and the band may fall off as soon as 48 hours after the banding (the band may occasionally be seen in the toilet bowl following a bowel movement). You may notice a temporary feeling of fullness in the rectum which should respond adequately to plain Tylenol or Motrin.  Following the banding, avoid strenuous exercise that evening and resume full activity the next day.  A sitz bath (soaking in a warm tub) or bidet is soothing, and can be useful for cleansing the area after bowel movements.     To avoid constipation, take two tablespoons of natural wheat bran, natural oat bran, flax, Benefiber or any over the counter fiber supplement and increase your water intake to 7-8 glasses daily.    Unless you have been prescribed anorectal medication, do not put anything inside your rectum for two weeks: No suppositories, enemas, fingers, etc.  Occasionally, you may have more bleeding than usual after the banding procedure.  This is often from the untreated hemorrhoids rather than the treated one.  Don't be concerned if there is a tablespoon or so of blood.  If there is more blood than this, lie flat with your bottom higher than your head and apply an ice pack to the area. If the bleeding does not stop within a half an hour or if you feel faint, call our office at [phone] or go to the emergency room.  Problems are not common; however, if there is a substantial amount of bleeding, severe pain, chills, fever or difficulty passing urine (very rare) or other problems, you should call us at [phone] or report to the  nearest emergency room.  Do not stay seated continuously for more than 2-3 hours for a day or two after the procedure.  Tighten your buttock muscles 10-15 times every two hours and take 10-15 deep breaths every 1-2 hours.  Do not spend more than a few minutes on the toilet if you cannot empty your bowel; instead re-visit the toilet at a later time.   Thank you for choosing me and [facility].  Dr. Hilarie Fredrickson

## 2021-04-10 NOTE — Progress Notes (Signed)
hydr

## 2021-04-17 DIAGNOSIS — N390 Urinary tract infection, site not specified: Secondary | ICD-10-CM | POA: Diagnosis not present

## 2021-04-17 DIAGNOSIS — R972 Elevated prostate specific antigen [PSA]: Secondary | ICD-10-CM | POA: Diagnosis not present

## 2021-04-17 DIAGNOSIS — R350 Frequency of micturition: Secondary | ICD-10-CM | POA: Diagnosis not present

## 2021-04-27 DIAGNOSIS — Z23 Encounter for immunization: Secondary | ICD-10-CM | POA: Diagnosis not present

## 2021-05-14 DIAGNOSIS — R350 Frequency of micturition: Secondary | ICD-10-CM | POA: Diagnosis not present

## 2021-05-15 DIAGNOSIS — M1711 Unilateral primary osteoarthritis, right knee: Secondary | ICD-10-CM | POA: Diagnosis not present

## 2021-05-15 DIAGNOSIS — M25561 Pain in right knee: Secondary | ICD-10-CM | POA: Diagnosis not present

## 2021-05-20 DIAGNOSIS — R350 Frequency of micturition: Secondary | ICD-10-CM | POA: Diagnosis not present

## 2021-06-25 DIAGNOSIS — H40012 Open angle with borderline findings, low risk, left eye: Secondary | ICD-10-CM | POA: Diagnosis not present

## 2021-06-25 DIAGNOSIS — H40021 Open angle with borderline findings, high risk, right eye: Secondary | ICD-10-CM | POA: Diagnosis not present

## 2021-07-23 DIAGNOSIS — I700 Atherosclerosis of aorta: Secondary | ICD-10-CM | POA: Diagnosis not present

## 2021-07-23 DIAGNOSIS — N3000 Acute cystitis without hematuria: Secondary | ICD-10-CM | POA: Diagnosis not present

## 2021-07-23 DIAGNOSIS — R1031 Right lower quadrant pain: Secondary | ICD-10-CM | POA: Diagnosis not present

## 2021-08-06 DIAGNOSIS — Z96612 Presence of left artificial shoulder joint: Secondary | ICD-10-CM | POA: Diagnosis not present

## 2021-08-06 DIAGNOSIS — Z96611 Presence of right artificial shoulder joint: Secondary | ICD-10-CM | POA: Diagnosis not present

## 2021-08-22 DIAGNOSIS — N451 Epididymitis: Secondary | ICD-10-CM | POA: Diagnosis not present

## 2021-08-22 DIAGNOSIS — N410 Acute prostatitis: Secondary | ICD-10-CM | POA: Diagnosis not present

## 2021-09-24 DIAGNOSIS — N410 Acute prostatitis: Secondary | ICD-10-CM | POA: Diagnosis not present

## 2021-09-24 DIAGNOSIS — N390 Urinary tract infection, site not specified: Secondary | ICD-10-CM | POA: Diagnosis not present

## 2021-10-08 DIAGNOSIS — H1045 Other chronic allergic conjunctivitis: Secondary | ICD-10-CM | POA: Diagnosis not present

## 2021-10-21 DIAGNOSIS — D225 Melanocytic nevi of trunk: Secondary | ICD-10-CM | POA: Diagnosis not present

## 2021-10-21 DIAGNOSIS — L821 Other seborrheic keratosis: Secondary | ICD-10-CM | POA: Diagnosis not present

## 2021-10-21 DIAGNOSIS — D1801 Hemangioma of skin and subcutaneous tissue: Secondary | ICD-10-CM | POA: Diagnosis not present

## 2021-10-21 DIAGNOSIS — L723 Sebaceous cyst: Secondary | ICD-10-CM | POA: Diagnosis not present

## 2021-10-21 DIAGNOSIS — L570 Actinic keratosis: Secondary | ICD-10-CM | POA: Diagnosis not present

## 2021-10-21 DIAGNOSIS — Z85828 Personal history of other malignant neoplasm of skin: Secondary | ICD-10-CM | POA: Diagnosis not present

## 2021-10-25 DIAGNOSIS — M1711 Unilateral primary osteoarthritis, right knee: Secondary | ICD-10-CM | POA: Diagnosis not present

## 2021-10-25 DIAGNOSIS — M25561 Pain in right knee: Secondary | ICD-10-CM | POA: Diagnosis not present

## 2021-10-25 DIAGNOSIS — Z96642 Presence of left artificial hip joint: Secondary | ICD-10-CM | POA: Diagnosis not present

## 2021-11-12 DIAGNOSIS — R8271 Bacteriuria: Secondary | ICD-10-CM | POA: Diagnosis not present

## 2021-11-12 DIAGNOSIS — N410 Acute prostatitis: Secondary | ICD-10-CM | POA: Diagnosis not present

## 2021-11-27 DIAGNOSIS — N390 Urinary tract infection, site not specified: Secondary | ICD-10-CM | POA: Diagnosis not present

## 2021-11-27 DIAGNOSIS — R5383 Other fatigue: Secondary | ICD-10-CM | POA: Diagnosis not present

## 2021-11-27 DIAGNOSIS — R509 Fever, unspecified: Secondary | ICD-10-CM | POA: Diagnosis not present

## 2021-11-27 DIAGNOSIS — R7989 Other specified abnormal findings of blood chemistry: Secondary | ICD-10-CM | POA: Diagnosis not present

## 2021-11-27 DIAGNOSIS — R051 Acute cough: Secondary | ICD-10-CM | POA: Diagnosis not present

## 2021-11-27 DIAGNOSIS — Z1152 Encounter for screening for COVID-19: Secondary | ICD-10-CM | POA: Diagnosis not present

## 2021-11-27 DIAGNOSIS — U071 COVID-19: Secondary | ICD-10-CM | POA: Diagnosis not present

## 2021-12-24 DIAGNOSIS — H40013 Open angle with borderline findings, low risk, bilateral: Secondary | ICD-10-CM | POA: Diagnosis not present

## 2022-02-08 DIAGNOSIS — M25551 Pain in right hip: Secondary | ICD-10-CM | POA: Diagnosis not present

## 2022-02-08 DIAGNOSIS — M545 Low back pain, unspecified: Secondary | ICD-10-CM | POA: Diagnosis not present

## 2022-02-12 DIAGNOSIS — Z0000 Encounter for general adult medical examination without abnormal findings: Secondary | ICD-10-CM | POA: Diagnosis not present

## 2022-02-12 DIAGNOSIS — R7989 Other specified abnormal findings of blood chemistry: Secondary | ICD-10-CM | POA: Diagnosis not present

## 2022-02-12 DIAGNOSIS — Z125 Encounter for screening for malignant neoplasm of prostate: Secondary | ICD-10-CM | POA: Diagnosis not present

## 2022-02-17 IMAGING — RF DG C-ARM 1-60 MIN-NO REPORT
1 series · 4 of 4 positions shown · non-contrast
Comparison: No prior.

CLINICAL DATA: Left hip replacement.

EXAM:
OPERATIVE left HIP (WITH PELVIS IF PERFORMED) 4 VIEWS
TECHNIQUE: Fluoroscopic spot image(s) were submitted for interpretation
post-operatively.

[Series 1: unknown protocol · 0.20mm/px · 4 of 4 slices shown]
[im 1/4]
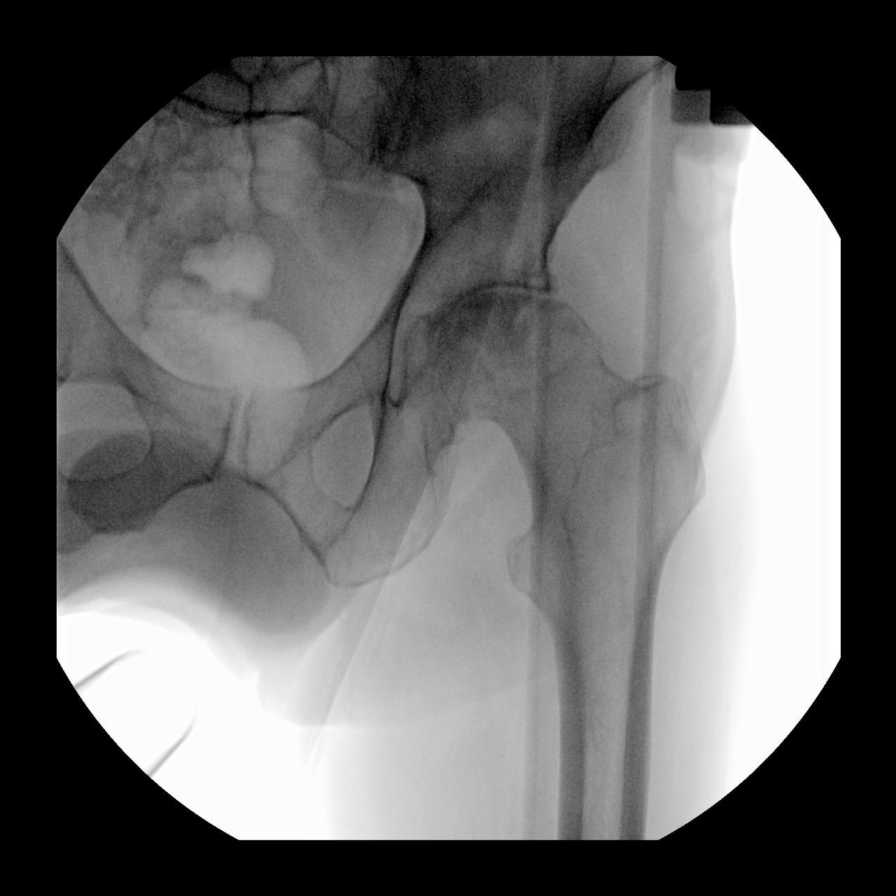
[im 2/4]
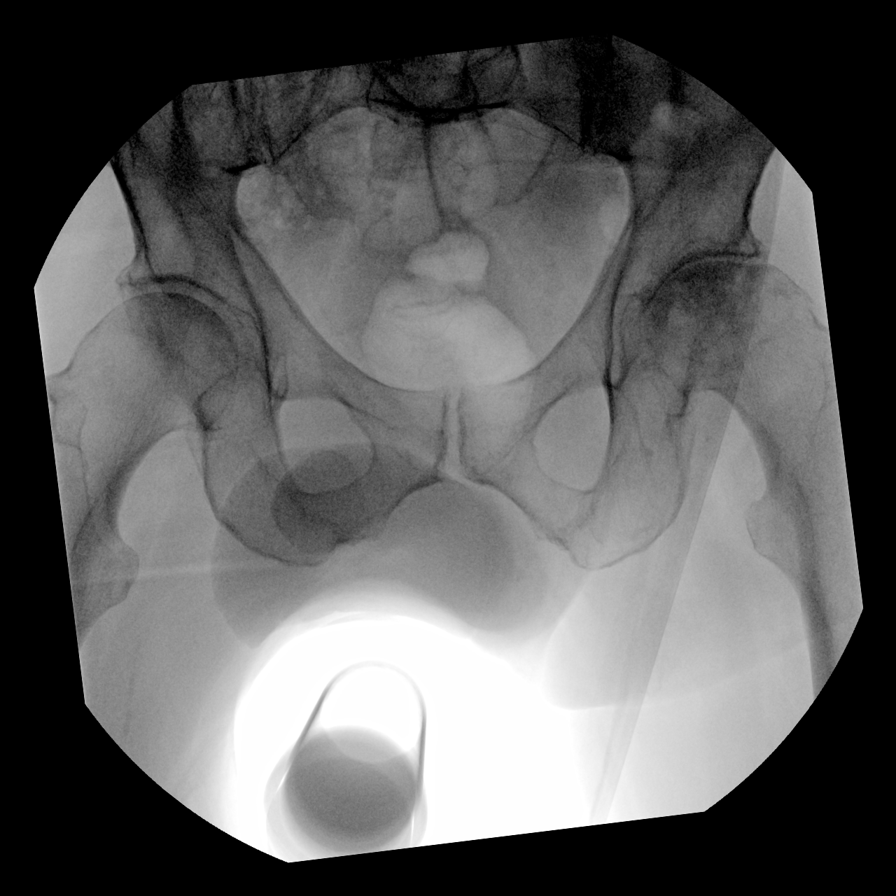
[im 3/4]
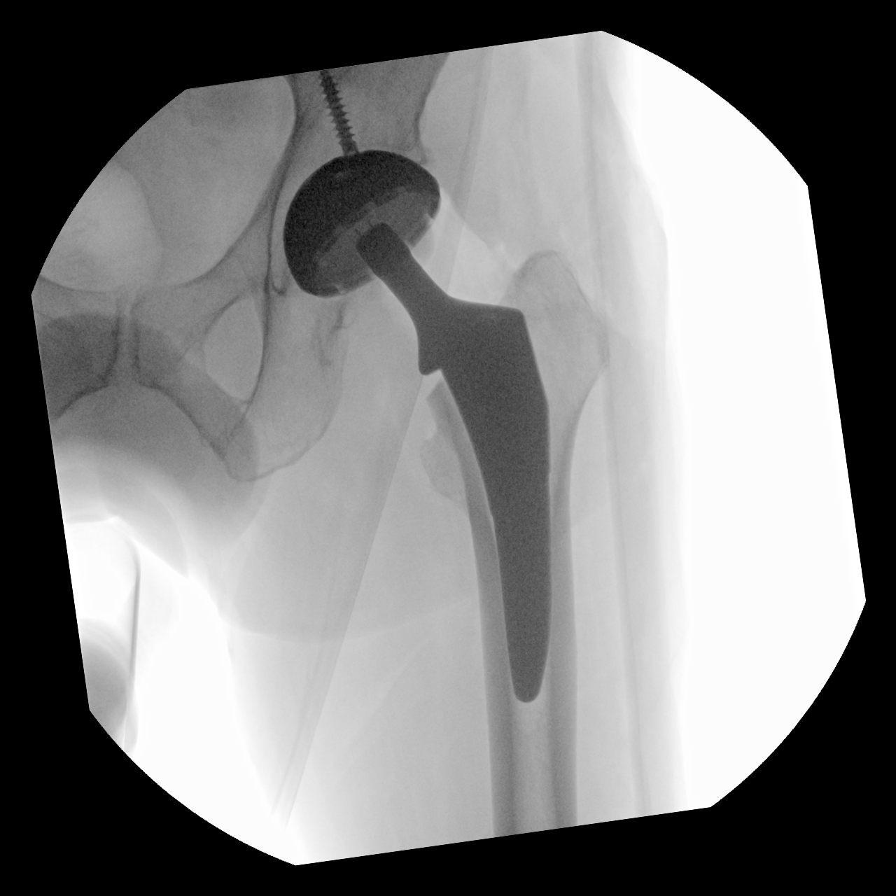
[im 4/4]
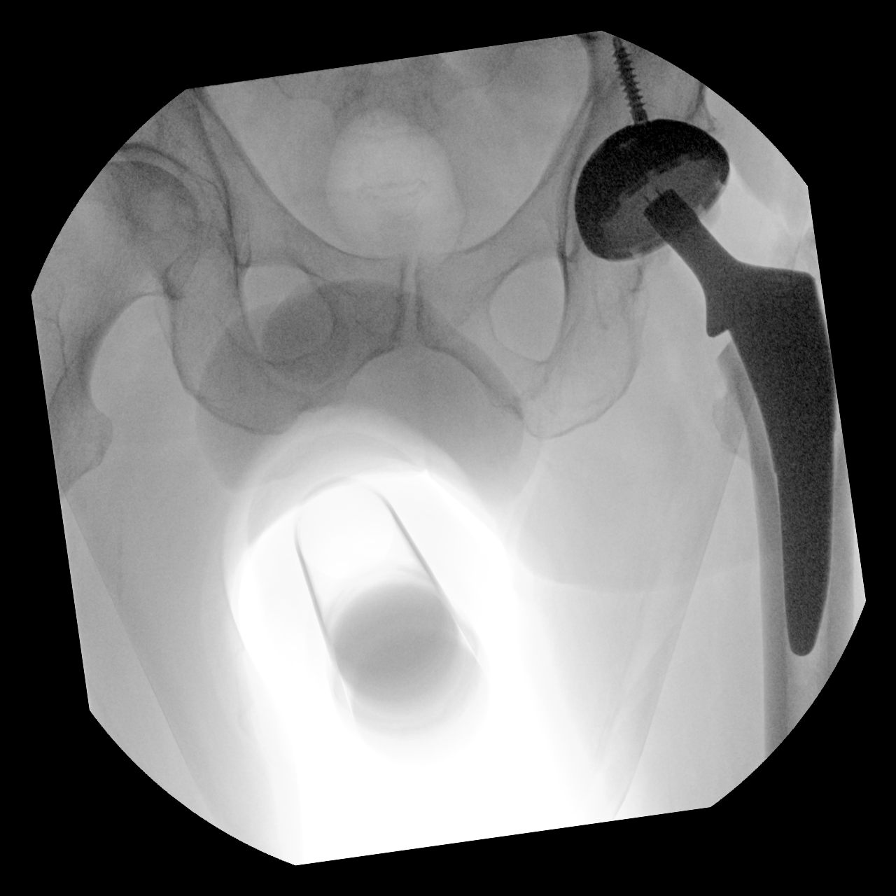

[4 of 4 positions shown; findings below may reference images not displayed]

FINDINGS: Postsurgical changes of left hip replacement noted. Visualized
hardware intact with anatomic alignment. Four views obtained.
IMPRESSION: Postsurgical changes of left hip replacement. Visualized hardware
intact with anatomic alignment.

## 2022-02-19 DIAGNOSIS — Z0000 Encounter for general adult medical examination without abnormal findings: Secondary | ICD-10-CM | POA: Diagnosis not present

## 2022-02-19 DIAGNOSIS — M545 Low back pain, unspecified: Secondary | ICD-10-CM | POA: Diagnosis not present

## 2022-02-19 DIAGNOSIS — N390 Urinary tract infection, site not specified: Secondary | ICD-10-CM | POA: Diagnosis not present

## 2022-02-19 DIAGNOSIS — Z1331 Encounter for screening for depression: Secondary | ICD-10-CM | POA: Diagnosis not present

## 2022-02-19 DIAGNOSIS — N529 Male erectile dysfunction, unspecified: Secondary | ICD-10-CM | POA: Diagnosis not present

## 2022-02-19 DIAGNOSIS — Z8262 Family history of osteoporosis: Secondary | ICD-10-CM | POA: Diagnosis not present

## 2022-02-19 DIAGNOSIS — Z1389 Encounter for screening for other disorder: Secondary | ICD-10-CM | POA: Diagnosis not present

## 2022-02-19 DIAGNOSIS — M1612 Unilateral primary osteoarthritis, left hip: Secondary | ICD-10-CM | POA: Diagnosis not present

## 2022-02-19 DIAGNOSIS — R972 Elevated prostate specific antigen [PSA]: Secondary | ICD-10-CM | POA: Diagnosis not present

## 2022-02-19 DIAGNOSIS — N200 Calculus of kidney: Secondary | ICD-10-CM | POA: Diagnosis not present

## 2022-02-19 DIAGNOSIS — Z86718 Personal history of other venous thrombosis and embolism: Secondary | ICD-10-CM | POA: Diagnosis not present

## 2022-02-20 DIAGNOSIS — M545 Low back pain, unspecified: Secondary | ICD-10-CM | POA: Diagnosis not present

## 2022-03-28 DIAGNOSIS — M1711 Unilateral primary osteoarthritis, right knee: Secondary | ICD-10-CM | POA: Diagnosis not present

## 2022-03-28 DIAGNOSIS — M25561 Pain in right knee: Secondary | ICD-10-CM | POA: Diagnosis not present

## 2022-04-10 DIAGNOSIS — R350 Frequency of micturition: Secondary | ICD-10-CM | POA: Diagnosis not present

## 2022-04-21 DIAGNOSIS — R351 Nocturia: Secondary | ICD-10-CM | POA: Diagnosis not present

## 2022-04-21 DIAGNOSIS — R350 Frequency of micturition: Secondary | ICD-10-CM | POA: Diagnosis not present

## 2022-05-03 DIAGNOSIS — Z23 Encounter for immunization: Secondary | ICD-10-CM | POA: Diagnosis not present

## 2022-05-08 DIAGNOSIS — M810 Age-related osteoporosis without current pathological fracture: Secondary | ICD-10-CM | POA: Diagnosis not present

## 2022-05-28 ENCOUNTER — Encounter: Payer: Self-pay | Admitting: Internal Medicine

## 2022-05-28 ENCOUNTER — Ambulatory Visit: Payer: Medicare PPO | Admitting: Internal Medicine

## 2022-05-28 VITALS — BP 116/72 | HR 89 | Ht 69.0 in | Wt 143.0 lb

## 2022-05-28 DIAGNOSIS — K648 Other hemorrhoids: Secondary | ICD-10-CM

## 2022-05-28 DIAGNOSIS — K30 Functional dyspepsia: Secondary | ICD-10-CM

## 2022-05-28 DIAGNOSIS — L290 Pruritus ani: Secondary | ICD-10-CM

## 2022-05-28 MED ORDER — CLOTRIMAZOLE-BETAMETHASONE 1-0.05 % EX CREA: 1.0000 | TOPICAL_CREAM | Freq: Two times a day (BID) | CUTANEOUS | 0 refills | Status: DC

## 2022-05-28 MED ORDER — FAMOTIDINE 20 MG PO TABS: ORAL_TABLET | ORAL | Status: DC

## 2022-05-28 NOTE — Progress Notes (Signed)
   Subjective:    Patient ID: Nicholas Terry, male    DOB: 1950/11/11, 71 y.o.   MRN: [id number]  HPI Nicholas Terry is a 71 year old male with a history of symptomatic internal hemorrhoids with prolapse and rare bleeding, history of adenomatous colon polyp, prior DVT on Xarelto who is seen for follow-up.  He is known to me from prior hemorrhoidal banding treatments.  He completed the 3 banding protocol over a 2-1/67-month  Between April and June 2022.  He then came back in September 2022 because he had had improvement but still some minor tissue protrusion and prolapse.  We again banded the left lateral hemorrhoid at that time.  He did well for the better part of the.  Since last being seen but he is still since August having some intermittent itching and perianal burning.  This can be worse at night.  He also feels tissue protrusion or minor prolapse after bowel movement.  He will at times reduce this while he is showering.  He has resume sitz bath's about 4 to 5 days a week as well as over-the-counter hydrocortisone cream in preparation suppository.  He is not having bleeding with wiping or bowel movement.  Bowels are usually formed and once or twice per day.  Can occasionally be large.  Is using low-dose Metamucil but if he uses any additional Metamucil he will have crampy abdominal pain and bloating.  He is not having pain with stool passing.  Separately he does have some mid to upper abdominal pain on occasion seems to be worse in the middle of the day after waking up from a nap.  Better after Gaviscon.   Review of Systems As per HPI, otherwise negative  Current Medications, Allergies, Past Medical History, Past Surgical History, Family History and Social History were reviewed in [employer].    Objective:   Physical Exam BP 116/72   Pulse 89   Ht '5\' 9"'$  (1.753 m)   Wt 143 lb (64.9 kg)   BMI 21.12 kg/m  Gen: awake, alert, NAD HEENT: anicteric Neuro:  nonfocal     Assessment & Plan:  71year old male with a history of symptomatic internal hemorrhoids with prolapse and rare bleeding, history of adenomatous colon polyp, prior DVT on Xarelto who is seen for follow-up.  History of internal hemorrhoids with prolapse/perianal itch --he has had hemorrhoidal banding x4 and is still having some minor tissue prolapse.  We discussed how I am not convinced that additional banding would lead to more meaningful improvement in symptoms.  I do think that we could try prescription antifungal/steroid cream as well as RectiCare for a few months and if no better consider additional banding. -- Lotrisone cream twice daily external perianal skin for 7 to 10 days -- RectiCare over-the-counter per tube instruction -- Follow-up in January, possible consideration of hemorrhoidal banding or surgical referral  2.  Intermittent crampy abdominal pain --this may be more of a dyspeptic symptoms given response to Gaviscon.  No alarm symptoms.  I recommended he consider over-the-counter famotidine 20 mg once to twice daily.  He may try this once daily in the morning for a week to see if pain improves.  Attention on follow-up if this continues.  30 minutes total spent today including patient facing time, coordination of care, reviewing medical history/procedures/pertinent radiology studies, and documentation of the encounter.

## 2022-05-28 NOTE — Patient Instructions (Addendum)
_______________________________________________________  If you are age 72 or older, your body mass index should be between 23-30. Your Body mass index is 21.12 kg/m. If this is out of the aforementioned range listed, please consider follow up with your Primary Care Provider.  If you are age 70 or younger, your body mass index should be between 19-25. Your Body mass index is 21.12 kg/m. If this is out of the aformentioned range listed, please consider follow up with your Primary Care Provider.   ________________________________________________________  The [facility] providers would like to encourage you to use [facility] to communicate with providers for non-urgent requests or questions.  Due to long hold times on the telephone, sending your provider a message by [facility] may be a faster and more efficient way to get a response.  Please allow 48 business hours for a response.  Please remember that this is for non-urgent requests.  _______________________________________________________  Please purchase the following medications over the counter and take as directed:  START Pepcid 1 to 2 tablets as needed for acid indigestion.  USE Recticare as needed  USE Lotrisone cream twice daily.  You are scheduled to follow up on 07-31-22 at 3:40pm.  Thank you for entrusting me with your care and choosing [facility].  Dr Hilarie Fredrickson

## 2022-05-30 ENCOUNTER — Telehealth: Payer: Self-pay | Admitting: Internal Medicine

## 2022-05-30 MED ORDER — CLOTRIMAZOLE-BETAMETHASONE 1-0.05 % EX CREA: 1.0000 | TOPICAL_CREAM | Freq: Two times a day (BID) | CUTANEOUS | 0 refills | Status: DC

## 2022-05-30 NOTE — Telephone Encounter (Signed)
Patient called stating that during his visit with Dr. Hilarie Fredrickson he was advised to use Lotrisone cream but says it was not ordered to the pharmacy. Please advise

## 2022-05-30 NOTE — Telephone Encounter (Signed)
Rx sent 

## 2022-06-02 DIAGNOSIS — H9202 Otalgia, left ear: Secondary | ICD-10-CM | POA: Diagnosis not present

## 2022-06-02 DIAGNOSIS — H6692 Otitis media, unspecified, left ear: Secondary | ICD-10-CM | POA: Diagnosis not present

## 2022-06-02 DIAGNOSIS — J018 Other acute sinusitis: Secondary | ICD-10-CM | POA: Diagnosis not present

## 2022-06-19 DIAGNOSIS — R6883 Chills (without fever): Secondary | ICD-10-CM | POA: Diagnosis not present

## 2022-06-19 DIAGNOSIS — R519 Headache, unspecified: Secondary | ICD-10-CM | POA: Diagnosis not present

## 2022-06-19 DIAGNOSIS — M791 Myalgia, unspecified site: Secondary | ICD-10-CM | POA: Diagnosis not present

## 2022-06-19 DIAGNOSIS — J069 Acute upper respiratory infection, unspecified: Secondary | ICD-10-CM | POA: Diagnosis not present

## 2022-06-19 DIAGNOSIS — R0981 Nasal congestion: Secondary | ICD-10-CM | POA: Diagnosis not present

## 2022-06-19 DIAGNOSIS — R0602 Shortness of breath: Secondary | ICD-10-CM | POA: Diagnosis not present

## 2022-06-19 DIAGNOSIS — Z1152 Encounter for screening for COVID-19: Secondary | ICD-10-CM | POA: Diagnosis not present

## 2022-07-03 DIAGNOSIS — H40013 Open angle with borderline findings, low risk, bilateral: Secondary | ICD-10-CM | POA: Diagnosis not present

## 2022-07-31 ENCOUNTER — Encounter: Payer: Self-pay | Admitting: Internal Medicine

## 2022-07-31 ENCOUNTER — Ambulatory Visit: Payer: Medicare PPO | Admitting: Internal Medicine

## 2022-07-31 VITALS — BP 98/76 | HR 94 | Ht 69.0 in | Wt 143.0 lb

## 2022-07-31 DIAGNOSIS — L290 Pruritus ani: Secondary | ICD-10-CM | POA: Diagnosis not present

## 2022-07-31 DIAGNOSIS — K648 Other hemorrhoids: Secondary | ICD-10-CM | POA: Diagnosis not present

## 2022-07-31 NOTE — Patient Instructions (Addendum)
_______________________________________________________  If you are age 73 or older, your body mass index should be between 23-30. Your Body mass index is 21.12 kg/m. If this is out of the aforementioned range listed, please consider follow up with your Primary Care Provider.  If you are age 76 or younger, your body mass index should be between 19-25. Your Body mass index is 21.12 kg/m. If this is out of the aformentioned range listed, please consider follow up with your Primary Care Provider.   ________________________________________________________  The [facility] providers would like to encourage you to use [facility] to communicate with providers for non-urgent requests or questions.  Due to long hold times on the telephone, sending your provider a message by [facility] may be a faster and more efficient way to get a response.  Please allow 48 business hours for a response.  Please remember that this is for non-urgent requests.  _______________________________________________________ We will send your records to Dr Lindon Romp   [facility] - [facility]  [address], [address] [place], [place] [address]  [phone]  Thank you for entrusting me with your care and for choosing [facility], Dr. Zenovia Jarred

## 2022-07-31 NOTE — Progress Notes (Signed)
   Subjective:    Patient ID: Nicholas Terry, male    DOB: 11/06/1950, 72 y.o.   MRN: [id number]  HPI Ervine Witucki is a 72 year old male with a history of symptomatic internal hemorrhoids with prolapse and rare bleeding, history of adenomatous colon polyp, prior DVT on Xarelto who is seen for follow-up.   Previous hemorrhoidal banding x 4.  He continues to have intermittent prolapse after each bowel movement requiring manual reduction.  Resolution of crampy abdominal pain.  Improved perianal itch with Lotrisone.   Review of Systems As per HPI, otherwise negative  Current Medications, Allergies, Past Medical History, Past Surgical History, Family History and Social History were reviewed in [employer] record.    Objective:   Physical Exam BP 98/76   Pulse 94   Ht '5\' 9"'$  (1.753 m)   Wt 143 lb (64.9 kg)   BMI 21.12 kg/m  Gen: awake, alert, NAD HEENT: anicteric Neuro: nonfocal  ANOSCOPY: Using a disposable, lubricated, slotted, self-illuminating anoscope, the rectum was intubated without difficulty. The trochar was removed and the ano-rectum was circumferentially inspected. There were small internal hemorrhoids, RA=LL>RP. There was no finding of an anorectal fissure. The rectal mucosa was not inflamed.  Anal papilla hypertrophy.  No neoplasia or other pathology was identified. The inspection was well tolerated.         Assessment & Plan:  72 year old male with a history of symptomatic internal hemorrhoids with prolapse and rare bleeding, history of adenomatous colon polyp, prior DVT on Xarelto who is seen for follow-up.   Symptomatic prolapsing hemorrhoids refractory to banding --at this point hemorrhoidal banding has not been effective to alleviate his symptoms which bother him after each bowel movement.  We have maximized medical therapy and I feel that it would be appropriate to have surgical consultation for surgical treatment. -- Referral to Dr. Parke Poisson with colorectal surgery  2.  Perianal itch --improved with Lotrisone.  This can be used as needed but should not be used continuously.  20 minutes total spent today including patient facing time, coordination of care, reviewing medical history/procedures/pertinent radiology studies, and documentation of the encounter.

## 2022-08-14 ENCOUNTER — Telehealth: Payer: Self-pay

## 2022-08-14 DIAGNOSIS — K648 Other hemorrhoids: Secondary | ICD-10-CM | POA: Diagnosis not present

## 2022-08-14 NOTE — Telephone Encounter (Signed)
Referred patient to [facility] [phone]) on 07-31-22.  Patient was made an appointment and saw Dr. Maryan Rued today, 08-14-22.  Office note will be faxed to Dr. Hilarie Fredrickson at [phone].

## 2022-08-15 DIAGNOSIS — R8271 Bacteriuria: Secondary | ICD-10-CM | POA: Diagnosis not present

## 2022-08-15 DIAGNOSIS — N390 Urinary tract infection, site not specified: Secondary | ICD-10-CM | POA: Diagnosis not present

## 2022-08-15 DIAGNOSIS — N410 Acute prostatitis: Secondary | ICD-10-CM | POA: Diagnosis not present

## 2022-08-19 NOTE — Telephone Encounter (Signed)
Close encounter 

## 2022-09-18 DIAGNOSIS — K649 Unspecified hemorrhoids: Secondary | ICD-10-CM | POA: Diagnosis not present

## 2022-09-18 DIAGNOSIS — Z7901 Long term (current) use of anticoagulants: Secondary | ICD-10-CM | POA: Diagnosis not present

## 2022-09-18 DIAGNOSIS — K219 Gastro-esophageal reflux disease without esophagitis: Secondary | ICD-10-CM | POA: Diagnosis not present

## 2022-09-18 DIAGNOSIS — Z86711 Personal history of pulmonary embolism: Secondary | ICD-10-CM | POA: Diagnosis not present

## 2022-09-18 DIAGNOSIS — Z882 Allergy status to sulfonamides status: Secondary | ICD-10-CM | POA: Diagnosis not present

## 2022-09-18 DIAGNOSIS — Z79899 Other long term (current) drug therapy: Secondary | ICD-10-CM | POA: Diagnosis not present

## 2022-09-18 DIAGNOSIS — K648 Other hemorrhoids: Secondary | ICD-10-CM | POA: Diagnosis not present

## 2022-09-18 DIAGNOSIS — Z86718 Personal history of other venous thrombosis and embolism: Secondary | ICD-10-CM | POA: Diagnosis not present

## 2022-10-13 DIAGNOSIS — M1711 Unilateral primary osteoarthritis, right knee: Secondary | ICD-10-CM | POA: Diagnosis not present

## 2022-11-06 DIAGNOSIS — N401 Enlarged prostate with lower urinary tract symptoms: Secondary | ICD-10-CM | POA: Diagnosis not present

## 2022-11-11 DIAGNOSIS — D1801 Hemangioma of skin and subcutaneous tissue: Secondary | ICD-10-CM | POA: Diagnosis not present

## 2022-11-11 DIAGNOSIS — L812 Freckles: Secondary | ICD-10-CM | POA: Diagnosis not present

## 2022-11-11 DIAGNOSIS — L723 Sebaceous cyst: Secondary | ICD-10-CM | POA: Diagnosis not present

## 2022-11-11 DIAGNOSIS — L820 Inflamed seborrheic keratosis: Secondary | ICD-10-CM | POA: Diagnosis not present

## 2022-11-11 DIAGNOSIS — H61001 Unspecified perichondritis of right external ear: Secondary | ICD-10-CM | POA: Diagnosis not present

## 2022-11-11 DIAGNOSIS — Z85828 Personal history of other malignant neoplasm of skin: Secondary | ICD-10-CM | POA: Diagnosis not present

## 2022-11-11 DIAGNOSIS — L821 Other seborrheic keratosis: Secondary | ICD-10-CM | POA: Diagnosis not present

## 2023-01-08 DIAGNOSIS — H40013 Open angle with borderline findings, low risk, bilateral: Secondary | ICD-10-CM | POA: Diagnosis not present

## 2023-02-13 DIAGNOSIS — R7989 Other specified abnormal findings of blood chemistry: Secondary | ICD-10-CM | POA: Diagnosis not present

## 2023-02-13 DIAGNOSIS — M810 Age-related osteoporosis without current pathological fracture: Secondary | ICD-10-CM | POA: Diagnosis not present

## 2023-02-13 DIAGNOSIS — R972 Elevated prostate specific antigen [PSA]: Secondary | ICD-10-CM | POA: Diagnosis not present

## 2023-02-24 DIAGNOSIS — R972 Elevated prostate specific antigen [PSA]: Secondary | ICD-10-CM | POA: Diagnosis not present

## 2023-02-24 DIAGNOSIS — M1612 Unilateral primary osteoarthritis, left hip: Secondary | ICD-10-CM | POA: Diagnosis not present

## 2023-02-24 DIAGNOSIS — N200 Calculus of kidney: Secondary | ICD-10-CM | POA: Diagnosis not present

## 2023-02-24 DIAGNOSIS — H8110 Benign paroxysmal vertigo, unspecified ear: Secondary | ICD-10-CM | POA: Diagnosis not present

## 2023-02-24 DIAGNOSIS — N529 Male erectile dysfunction, unspecified: Secondary | ICD-10-CM | POA: Diagnosis not present

## 2023-02-24 DIAGNOSIS — Z0000 Encounter for general adult medical examination without abnormal findings: Secondary | ICD-10-CM | POA: Diagnosis not present

## 2023-02-24 DIAGNOSIS — M810 Age-related osteoporosis without current pathological fracture: Secondary | ICD-10-CM | POA: Diagnosis not present

## 2023-02-24 DIAGNOSIS — R82998 Other abnormal findings in urine: Secondary | ICD-10-CM | POA: Diagnosis not present

## 2023-02-24 DIAGNOSIS — N390 Urinary tract infection, site not specified: Secondary | ICD-10-CM | POA: Diagnosis not present

## 2023-02-24 DIAGNOSIS — Z86718 Personal history of other venous thrombosis and embolism: Secondary | ICD-10-CM | POA: Diagnosis not present

## 2023-03-05 DIAGNOSIS — H8111 Benign paroxysmal vertigo, right ear: Secondary | ICD-10-CM | POA: Diagnosis not present

## 2023-03-05 DIAGNOSIS — M1711 Unilateral primary osteoarthritis, right knee: Secondary | ICD-10-CM | POA: Diagnosis not present

## 2023-04-14 DIAGNOSIS — N401 Enlarged prostate with lower urinary tract symptoms: Secondary | ICD-10-CM | POA: Diagnosis not present

## 2023-04-25 DIAGNOSIS — Z23 Encounter for immunization: Secondary | ICD-10-CM | POA: Diagnosis not present

## 2023-04-28 DIAGNOSIS — R350 Frequency of micturition: Secondary | ICD-10-CM | POA: Diagnosis not present

## 2023-04-28 DIAGNOSIS — R351 Nocturia: Secondary | ICD-10-CM | POA: Diagnosis not present

## 2023-05-19 DIAGNOSIS — J302 Other seasonal allergic rhinitis: Secondary | ICD-10-CM | POA: Diagnosis not present

## 2023-05-19 DIAGNOSIS — H8111 Benign paroxysmal vertigo, right ear: Secondary | ICD-10-CM | POA: Diagnosis not present

## 2023-06-02 NOTE — Progress Notes (Unsigned)
New Patient Note  RE: Nicholas Terry MRN: [id number] DOB: 1951/02/14 Date of Office Visit: 06/03/2023  Consult requested by: Charlane Ferretti, DO Primary care provider: Charlane Ferretti, DO  Chief Complaint: No chief complaint on file.  History of Present Illness: I had the pleasure of seeing Nicholas Terry for initial evaluation at the [facility] on 06/02/2023. He is a 72 y.o. male, who is referred here by Charlane Ferretti, DO for the evaluation of ***.  Discussed the use of AI scribe software for clinical note transcription with the patient, who gave verbal consent to proceed.  History of Present Illness             ***  Assessment and Plan: [name] is a 72 y.o. male with: ***  Assessment and Plan               No follow-ups on file.  No orders of the defined types were placed in this encounter.  Lab Orders  No laboratory test(s) ordered today    Other allergy screening: Asthma: {Blank single:19197::"yes","no"} Rhino conjunctivitis: {Blank single:19197::"yes","no"} Food allergy: {Blank single:19197::"yes","no"} Medication allergy: {Blank single:19197::"yes","no"} Hymenoptera allergy: {Blank single:19197::"yes","no"} Urticaria: {Blank single:19197::"yes","no"} Eczema:{Blank single:19197::"yes","no"} History of recurrent infections suggestive of immunodeficency: {Blank single:19197::"yes","no"}  Diagnostics: Spirometry:  Tracings reviewed. His effort: {Blank single:19197::"Good reproducible efforts.","It was hard to get consistent efforts and there is a question as to whether this reflects a maximal maneuver.","Poor effort, data can not be interpreted."} FVC: ***L FEV1: ***L, ***% predicted FEV1/FVC ratio: ***% Interpretation: {Blank single:19197::"Spirometry consistent with mild obstructive disease","Spirometry consistent with moderate obstructive disease","Spirometry consistent with severe obstructive disease","Spirometry consistent with  possible restrictive disease","Spirometry consistent with mixed obstructive and restrictive disease","Spirometry uninterpretable due to technique","Spirometry consistent with normal pattern","No overt abnormalities noted given today's efforts"}.  Please see scanned spirometry results for details.  Skin Testing: {Blank single:19197::"Select foods","Environmental allergy panel","Environmental allergy panel and select foods","Food allergy panel","None","Deferred due to recent antihistamines use"}. *** Results discussed with patient/family.   Past Medical History: Patient Active Problem List   Diagnosis Date Noted  . 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 Adenomatous colon polyp   . Arthritis   . BPH (benign prostatic hyperplasia)   . Complication of anesthesia   . DVT (deep venous thrombosis) (HCC)    bil calf  . ED (erectile dysfunction)   . Foot mass, left    left plantar forefoot  . History of colon polyps   . History of kidney stones   . Internal hemorrhoids   . Internal hemorrhoids   . Pneumonia    hx of   . PONV (postoperative nausea and vomiting)   . Seasonal allergies    Past Surgical History: Past Surgical History:  Procedure Laterality Date  . COLONOSCOPY    . JOINT REPLACEMENT Left    knee  . KIDNEY STONE SURGERY     x3  . KNEE ARTHROSCOPY Bilateral   . MASS EXCISION Left 03/11/2018   Procedure: Excisional biopsy of left forefoot mass;  Surgeon: Toni Arthurs, MD;  Location: [facility];  Service: Orthopedics;  Laterality: Left;  60 mins  . SHOULDER ARTHROSCOPY Right   . 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   Procedure: TOTAL HIP ARTHROPLASTY ANTERIOR APPROACH;  Surgeon: Durene Romans, MD;  Location: [facility];  Service: Orthopedics;  Laterality: Left;  70 mins  . TOTAL SHOULDER ARTHROPLASTY Left 05/27/2018  .  TOTAL SHOULDER ARTHROPLASTY Left 05/27/2018    Procedure: LEFT TOTAL SHOULDER ARTHROPLASTY;  Surgeon: Francena Hanly, MD;  Location: [facility];  Service: Orthopedics;  Laterality: Left;   . TOTAL SHOULDER ARTHROPLASTY Right 05/17/2020   Procedure: TOTAL SHOULDER ARTHROPLASTY;  Surgeon: Francena Hanly, MD;  Location: [facility];  Service: Orthopedics;  Laterality: Right;    Medication List:  Current Outpatient Medications  Medication Sig Dispense Refill  . acetaminophen (TYLENOL) 650 MG CR tablet Take 650 mg by mouth daily.    . APPLE CIDER VINEGAR PO Take 1 each by mouth 2 (two) times daily.     . Carboxymethylcellul-Glycerin (REFRESH OPTIVE OP) Place 1 drop into both eyes daily as needed (dry eyes).     . clotrimazole-betamethasone (LOTRISONE) cream Apply 1 Application topically 2 (two) times daily. To perianal X 7-10 days 30 g 0  . famotidine (PEPCID) 20 MG tablet Take one to two times daily as needed for acid indigestions    . hydrocortisone (ANUSOL-HC) 2.5 % rectal cream Place rectally 2 (two) times daily. As needed. 30 g 1  . loratadine (CLARITIN) 10 MG tablet Take 10 mg by mouth daily.    . Multiple Vitamin (MULTIVITAMIN WITH MINERALS) TABS tablet Take 1 tablet by mouth daily.    Marland Kitchen OVER THE COUNTER MEDICATION Apply 1 application topically daily as needed (joint pain). cbd cream    . OVER THE COUNTER MEDICATION Take 0.5 capsules by mouth daily as needed (pain). cbd gummies    . Saw Palmetto, Serenoa repens, (SAW PALMETTO PO) Take 1 tablet by mouth daily.    . tamsulosin (FLOMAX) 0.4 MG CAPS capsule Take 0.4 mg by mouth.    . traMADol (ULTRAM) 50 MG tablet Take 1 tablet (50 mg total) by mouth every 6 (six) hours as needed for moderate pain. 30 tablet 0  . XARELTO 10 MG TABS tablet Take 10 mg by mouth daily.      No current facility-administered medications for this visit.   Allergies: Allergies  Allergen Reactions  . Sulfa Antibiotics Rash   Social History: Social History   Socioeconomic History  . Marital status: Single     Spouse name: Not on file  . Number of children: 0  . Years of education: Not on file  . Highest education level: Not on file  Occupational History  . Occupation: retired Engineer, site  Tobacco Use  . Smoking status: Never  . Smokeless tobacco: Never  Vaping Use  . Vaping status: Never Used  Substance and Sexual Activity  . Alcohol use: Yes    Comment: social  . Drug use: Never  . Sexual activity: Not on file  Other Topics Concern  . Not on file  Social History Narrative  . Not on file   Social Determinants of Health   Financial Resource Strain: Low Risk  (08/14/2022)   Received from [facility], [facility]   Overall Financial Resource Strain (CARDIA)   . Difficulty of Paying Living Expenses: Not hard at all  Food Insecurity: No Food Insecurity (08/14/2022)   Received from [facility], [facility]   Hunger Vital Sign   . Worried About Programme researcher, broadcasting/film/video in the Last Year: Never true   . Ran Out of Food in the Last Year: Never true  Transportation Needs: No Transportation Needs (08/14/2022)   Received from [facility], [facility]   [facility] - Transportation   . Lack of Transportation (Medical): No   . Lack of Transportation (Non-Medical):  No  Physical Activity: Not on file  Stress: Not on file  Social Connections: Unknown (08/07/2022)   Received from [facility], [facility]   Social Network   . Social Network: Not on file   Lives in a ***. Smoking: *** Occupation: ***  Environmental HistorySurveyor, minerals in the house: Copywriter, advertising in the family room: {Blank single:19197::"yes","no"} Carpet in the bedroom: {Blank single:19197::"yes","no"} Heating: {Blank single:19197::"electric","gas","heat pump"} Cooling: {Blank single:19197::"central","window","heat pump"} Pet: {Blank single:19197::"yes ***","no"}  Family History: Family History  Problem Relation Age of Onset  . Parkinson's disease Mother   . Stroke Father    . Colon cancer Neg Hx   . Esophageal cancer Neg Hx   . Rectal cancer Neg Hx   . Colon polyps Neg Hx   . Stomach cancer Neg Hx    Problem                               Relation Asthma                                   *** Eczema                                *** Food allergy                          *** Allergic rhino conjunctivitis     ***  Review of Systems  Constitutional:  Negative for appetite change, chills, fever and unexpected weight change.  HENT:  Negative for congestion and rhinorrhea.   Eyes:  Negative for itching.  Respiratory:  Negative for cough, chest tightness, shortness of breath and wheezing.   Cardiovascular:  Negative for chest pain.  Gastrointestinal:  Negative for abdominal pain.  Genitourinary:  Negative for difficulty urinating.  Skin:  Negative for rash.  Neurological:  Negative for headaches.   Objective: There were no vitals taken for this visit. There is no height or weight on file to calculate BMI. Physical Exam Vitals and nursing note reviewed.  Constitutional:      Appearance: Normal appearance. He is well-developed.  HENT:     Head: Normocephalic and atraumatic.     Right Ear: Tympanic membrane and external ear normal.     Left Ear: Tympanic membrane and external ear normal.     Nose: Nose normal.     Mouth/Throat:     Mouth: Mucous membranes are moist.     Pharynx: Oropharynx is clear.  Eyes:     Conjunctiva/sclera: Conjunctivae normal.  Cardiovascular:     Rate and Rhythm: Normal rate and regular rhythm.     Heart sounds: Normal heart sounds. No murmur heard.    No friction rub. No gallop.  Pulmonary:     Effort: Pulmonary effort is normal.     Breath sounds: Normal breath sounds. No wheezing, rhonchi or rales.  Musculoskeletal:     Cervical back: Neck supple.  Skin:    General: Skin is warm.     Findings: No rash.  Neurological:     Mental Status: He is alert and oriented to person, place, and time.  Psychiatric:         Behavior: Behavior normal.  The plan was reviewed with the patient/family, and all questions/concerned were  addressed.  It was my pleasure to see Jeramy today and participate in his care. Please feel free to contact me with any questions or concerns.  Sincerely,  Wyline Mood, DO Allergy & Immunology  [facility] office: [phone] [facility] office: [phone]

## 2023-06-03 ENCOUNTER — Ambulatory Visit (INDEPENDENT_AMBULATORY_CARE_PROVIDER_SITE_OTHER): Payer: Medicare PPO | Admitting: Allergy

## 2023-06-03 ENCOUNTER — Other Ambulatory Visit: Payer: Self-pay

## 2023-06-03 ENCOUNTER — Encounter: Payer: Self-pay | Admitting: Allergy

## 2023-06-03 VITALS — BP 120/80 | HR 80 | Temp 98.5°F | Resp 18 | Ht 67.32 in | Wt 142.8 lb

## 2023-06-03 DIAGNOSIS — R42 Dizziness and giddiness: Secondary | ICD-10-CM | POA: Diagnosis not present

## 2023-06-03 DIAGNOSIS — J3089 Other allergic rhinitis: Secondary | ICD-10-CM | POA: Diagnosis not present

## 2023-06-03 NOTE — Patient Instructions (Addendum)
Rhinitis  Return for environmental allergy skin testing. Will make additional recommendations based on results. If significant positives will recommend allergy injections - handout given. Make sure you don't take any antihistamines for 3 days before the skin testing appointment. Don't put any lotion on the back and arms on the day of testing.  Plan on being here for 30-60 minutes.   Use Flonase (fluticasone) nasal spray 1-2 sprays per nostril once a day as needed for nasal congestion.  Nasal saline spray (i.e., Simply Saline) or nasal saline lavage (i.e., NeilMed) is recommended as needed and prior to medicated nasal sprays.  Follow up for skin testing.

## 2023-06-09 NOTE — Progress Notes (Unsigned)
   Skin testing note  RE: Nicholas Terry MRN: [id number] DOB: Oct 28, 1950 Date of Office Visit: 06/10/2023  Referring provider: Charlane Ferretti, DO Primary care provider: Charlane Ferretti, DO  Chief Complaint: No chief complaint on file.  History of Present Illness: I had the pleasure of seeing Nicholas Terry for a skin testing visit at the [facility] on 06/09/2023. He is a 72 y.o. male, who is being followed for allergic rhinitis, vertigo. His previous allergy office visit was on 06/03/2023 with Dr. Selena Terry. Today is a skin testing visit.   Discussed the use of AI scribe software for clinical note transcription with the patient, who gave verbal consent to proceed.  History of Present Illness             Other allergic rhinitis Increased symptoms in the fall, including rhinorrhea, itchy eyes, and lightheadedness. Currently using Zyrtec and Flonase as needed, with previous use of Claritin. No prior allergy testing. Return for environmental allergy skin testing. Will make additional recommendations based on results. If significant positives will recommend allergy injections - handout given. Make sure you don't take any antihistamines for 3 days before the skin testing appointment. Don't put any lotion on the back and arms on the day of testing.  Plan on being here for 30-60 minutes.  Use Flonase (fluticasone) nasal spray 1-2 sprays per nostril once a day as needed for nasal congestion.  Nasal saline spray (i.e., Simply Saline) or nasal saline lavage (i.e., NeilMed) is recommended as needed and prior to medicated nasal sprays.   Vertigo Four episodes of vertigo over the past year, with associated nausea and vomiting. No clear triggers identified. No history of stroke-like symptoms. No HTN. Previous evaluation by ENT and physical therapy without clear diagnosis. Continue monitoring symptoms. If allergy testing is negative, consider referral to neurology.    Return for  Skin testing. Assessment and Plan: [name] is a 72 y.o. male with: ***  Assessment and Plan              No follow-ups on file.  No orders of the defined types were placed in this encounter.  Lab Orders  No laboratory test(s) ordered today    Diagnostics: Skin Testing: Environmental allergy panel. *** Results discussed with patient/family.   Previous notes and tests were reviewed. The plan was reviewed with the patient/family, and all questions/concerned were addressed.  It was my pleasure to see Nicholas Terry today and participate in his care. Please feel free to contact me with any questions or concerns.  Sincerely,  Wyline Mood, DO Allergy & Immunology  [facility] office: [phone] [facility] office: [phone]

## 2023-06-10 ENCOUNTER — Encounter: Payer: Self-pay | Admitting: Allergy

## 2023-06-10 ENCOUNTER — Ambulatory Visit: Payer: Medicare PPO | Admitting: Allergy

## 2023-06-10 ENCOUNTER — Telehealth: Payer: Self-pay

## 2023-06-10 DIAGNOSIS — R42 Dizziness and giddiness: Secondary | ICD-10-CM

## 2023-06-10 DIAGNOSIS — J301 Allergic rhinitis due to pollen: Secondary | ICD-10-CM

## 2023-06-10 DIAGNOSIS — J3089 Other allergic rhinitis: Secondary | ICD-10-CM

## 2023-06-10 NOTE — Telephone Encounter (Signed)
Please refer patient to neurology for vertigo/dizziness. Per Dr. Selena Batten

## 2023-06-10 NOTE — Patient Instructions (Addendum)
Today's skin testing positive to weed, trees, perennial mold mix 2, dust mites.   Results given.  Environmental allergies Start environmental control measures as below. Use over the counter antihistamines such as Zyrtec (cetirizine), Claritin (loratadine), Allegra (fexofenadine), or Xyzal (levocetirizine) daily as needed. May switch antihistamines every few months. Use Flonase (fluticasone) nasal spray 1-2 sprays per nostril once a day as needed for nasal congestion.  Nasal saline spray (i.e., Simply Saline) or nasal saline lavage (i.e., NeilMed) is recommended as needed and prior to medicated nasal sprays.  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 This is not caused by food allergies. If it becomes bothersome I can prescribe a different nasal spray for this.   Vertigo/dizziness Refer to neurology  Return in about 4 months (around 10/08/2023). Or sooner if needed.   Reducing Pollen Exposure Pollen seasons: trees (spring), grass (summer) and ragweed/weeds (fall). Keep windows closed in your home and car to lower pollen exposure.  Install air conditioning in the bedroom and throughout the house if possible.  Avoid going out in dry windy days - especially early morning. Pollen counts are highest between 5 - 10 AM and on dry, hot and windy days.  Save outside activities for late afternoon or after a heavy rain, when pollen levels are lower.  Avoid mowing of grass if you have grass pollen allergy. Be aware that pollen can also be transported indoors on people and pets.  Dry your clothes in an automatic dryer rather than hanging them outside where they might collect pollen.  Rinse hair and eyes before bedtime.  Mold Control Mold and fungi can grow on a variety of surfaces provided certain temperature and moisture conditions exist.  Outdoor molds grow on plants, decaying vegetation and soil. The major outdoor mold, Alternaria and Cladosporium, are found in very high  numbers during hot and dry conditions. Generally, a late summer - fall peak is seen for common outdoor fungal spores. Rain will temporarily lower outdoor mold spore count, but counts rise rapidly when the rainy period ends. The most important indoor molds are Aspergillus and Penicillium. Dark, humid and poorly ventilated basements are ideal sites for mold growth. The next most common sites of mold growth are the bathroom and the kitchen. Outdoor (Seasonal) Mold Control Use air conditioning and keep windows closed. Avoid exposure to decaying vegetation. Avoid leaf raking. Avoid grain handling. Consider wearing a face mask if working in moldy areas.  Indoor (Perennial) Mold Control  Maintain humidity below 50%. Get rid of mold growth on hard surfaces with water, detergent and, if necessary, 5% bleach (do not mix with other cleaners). Then dry the area completely. If mold covers an area more than 10 square feet, consider hiring an indoor environmental professional. For clothing, washing with soap and water is best. If moldy items cannot be cleaned and dried, throw them away. Remove sources e.g. contaminated carpets. Repair and seal leaking roofs or pipes. Using dehumidifiers in damp basements may be helpful, but empty the water and clean units regularly to prevent mildew from forming. All rooms, especially basements, bathrooms and kitchens, require ventilation and cleaning to deter mold and mildew growth. Avoid carpeting on concrete or damp floors, and storing items in damp areas.  Control of House Dust Mite Allergen Dust mite allergens are a common trigger of allergy and asthma symptoms. While they can be found throughout the house, these microscopic creatures thrive in warm, humid environments such as bedding, upholstered furniture and carpeting. Because so  much time is spent in the bedroom, it is essential to reduce mite levels there.  Encase pillows, mattresses, and box springs in special  allergen-proof fabric covers or airtight, zippered plastic covers.  Bedding should be washed weekly in hot water (130 F) and dried in a hot dryer. Allergen-proof covers are available for comforters and pillows that can't be regularly washed.  Wash the allergy-proof covers every few months. Minimize clutter in the bedroom. Keep pets out of the bedroom.  Keep humidity less than 50% by using a dehumidifier or air conditioning. You can buy a humidity measuring device called a hygrometer to monitor this.  If possible, replace carpets with hardwood, linoleum, or washable area rugs. If that's not possible, vacuum frequently with a vacuum that has a HEPA filter. Remove all upholstered furniture and non-washable window drapes from the bedroom. Remove all non-washable stuffed toys from the bedroom.  Wash stuffed toys weekly.

## 2023-06-17 NOTE — Telephone Encounter (Signed)
Referral has been placed. Patient has been informed.

## 2023-06-22 ENCOUNTER — Encounter: Payer: Self-pay | Admitting: Neurology

## 2023-07-10 DIAGNOSIS — H40013 Open angle with borderline findings, low risk, bilateral: Secondary | ICD-10-CM | POA: Diagnosis not present

## 2023-07-27 ENCOUNTER — Ambulatory Visit: Payer: Medicare PPO | Admitting: Neurology

## 2023-08-03 ENCOUNTER — Ambulatory Visit: Payer: Medicare PPO | Admitting: Neurology

## 2023-08-12 DIAGNOSIS — M1711 Unilateral primary osteoarthritis, right knee: Secondary | ICD-10-CM | POA: Diagnosis not present

## 2023-08-18 DIAGNOSIS — R509 Fever, unspecified: Secondary | ICD-10-CM | POA: Diagnosis not present

## 2023-08-18 DIAGNOSIS — B029 Zoster without complications: Secondary | ICD-10-CM | POA: Diagnosis not present

## 2023-08-18 DIAGNOSIS — Z1152 Encounter for screening for COVID-19: Secondary | ICD-10-CM | POA: Diagnosis not present

## 2023-08-18 DIAGNOSIS — R109 Unspecified abdominal pain: Secondary | ICD-10-CM | POA: Diagnosis not present

## 2023-08-18 DIAGNOSIS — R5383 Other fatigue: Secondary | ICD-10-CM | POA: Diagnosis not present

## 2023-08-18 DIAGNOSIS — R0981 Nasal congestion: Secondary | ICD-10-CM | POA: Diagnosis not present

## 2023-08-18 DIAGNOSIS — R52 Pain, unspecified: Secondary | ICD-10-CM | POA: Diagnosis not present

## 2023-08-18 DIAGNOSIS — R059 Cough, unspecified: Secondary | ICD-10-CM | POA: Diagnosis not present

## 2023-08-31 ENCOUNTER — Ambulatory Visit: Payer: Medicare PPO | Admitting: Neurology

## 2023-08-31 ENCOUNTER — Encounter: Payer: Self-pay | Admitting: Neurology

## 2023-08-31 VITALS — BP 129/73 | HR 82 | Ht 67.35 in | Wt 145.0 lb

## 2023-08-31 DIAGNOSIS — H8110 Benign paroxysmal vertigo, unspecified ear: Secondary | ICD-10-CM | POA: Diagnosis not present

## 2023-08-31 MED ORDER — MECLIZINE HCL 25 MG PO TABS
25.0000 mg | ORAL_TABLET | Freq: Three times a day (TID) | ORAL | 0 refills | Status: DC | PRN
Start: 2023-08-31 — End: 2024-06-02

## 2023-08-31 NOTE — Progress Notes (Signed)
 [facility] Clinic Note - Initial Visit   Date: 08/31/2023   [name] MRN: [id number] DOB: 1951-05-12   Dear Dr. Burdette Carolin:  Thank you for your kind referral of Nicholas Terry for consultation of vertigo. Although his history is well known to you, please allow us  to reiterate it for the purpose of our medical record. The patient was accompanied to the clinic by self.    Nicholas Terry is a 73 y.o. right-handed male presenting for evaluation of vertigo.   IMPRESSION/PLAN: Benign paroxsymal peripheral vertigo.  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   - Start meclizine  25mg  as needed for vertigo  - Epley's maneuver has been provided by ENT  Return to clinic as needed  ------------------------------------------------------------- History of present illness: Since October 2023, he has had four distinct episodes of severe room-spinning with associated nausea/vomiting.  Spinning-sensation lasts about 5 minutes and following by sweats, nausea, and vomiting.  Symptoms were exacerbated by positional changes.  He would want to lay down and rest to get relief.  Each spell lasts about an hour.  It last occurred October 2024.  He tried vestibular therapy and saw ENT who felt symptoms were consistent with BPPV.  He is here for second opinion.   Past Medical History:  Diagnosis Date   Adenomatous colon polyp    Arthritis    BPH (benign prostatic hyperplasia)    Complication of anesthesia    DVT (deep venous thrombosis) (HCC)    bil calf   ED (erectile dysfunction)    Foot mass, left    left plantar forefoot   History of colon polyps    History of kidney stones    Internal hemorrhoids    Internal hemorrhoids    Pneumonia    hx of    PONV (postoperative nausea and vomiting)    Seasonal allergies     Past Surgical History:  Procedure Laterality Date   COLONOSCOPY     JOINT REPLACEMENT  Left    knee   KIDNEY STONE SURGERY     x3   KNEE ARTHROSCOPY Bilateral    MASS EXCISION Left 03/11/2018   Procedure: Excisional biopsy of left forefoot mass;  Surgeon: Amada Backer, MD;  Location: [facility];  Service: Orthopedics;  Laterality: Left;  60 mins   SHOULDER ARTHROSCOPY Right    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   Procedure: TOTAL HIP ARTHROPLASTY ANTERIOR APPROACH;  Surgeon: Claiborne Crew, MD;  Location: [facility];  Service: Orthopedics;  Laterality: Left;  70 mins   TOTAL SHOULDER ARTHROPLASTY Left 05/27/2018   TOTAL SHOULDER ARTHROPLASTY Left 05/27/2018   Procedure: LEFT TOTAL SHOULDER ARTHROPLASTY;  Surgeon: Ellard Gunning, MD;  Location: [facility];  Service: Orthopedics;  Laterality: Left;    TOTAL SHOULDER ARTHROPLASTY Right 05/17/2020   Procedure: TOTAL SHOULDER ARTHROPLASTY;  Surgeon: Ellard Gunning, MD;  Location: [facility];  Service: Orthopedics;  Laterality: Right;      Medications:  Outpatient Encounter Medications as of 08/31/2023  Medication Sig   acetaminophen  (TYLENOL ) 650 MG CR tablet Take 650 mg by mouth daily.   Cranberry 125 MG TABS Take by mouth.   fluticasone (FLONASE) 50 MCG/ACT nasal spray Place into both nostrils daily.   Lactobacillus Rhamnosus, GG, (CULTURELLE PO) Take by mouth.   loratadine  (CLARITIN ) 10 MG tablet Take 10 mg by mouth daily.   Multiple Vitamin (MULTIVITAMIN WITH MINERALS) TABS tablet  Take 1 tablet by mouth daily.   tamsulosin (FLOMAX) 0.4 MG CAPS capsule Take 0.4 mg by mouth.   XARELTO  10 MG TABS tablet Take 10 mg by mouth daily.    [DISCONTINUED] APPLE CIDER VINEGAR PO Take 1 each by mouth 2 (two) times daily.  (Patient not taking: Reported on 08/31/2023)   [DISCONTINUED] Carboxymethylcellul-Glycerin (REFRESH OPTIVE OP) Place 1 drop into both eyes daily as needed (dry eyes).  (Patient not taking: Reported on 08/31/2023)   [DISCONTINUED] famotidine  (PEPCID ) 20 MG tablet Take one to two times daily as needed for  acid indigestions (Patient not taking: Reported on 08/31/2023)   No facility-administered encounter medications on file as of 08/31/2023.    Allergies:  Allergies  Allergen Reactions   Sulfa Antibiotics Rash    Family History: Family History  Problem Relation Age of Onset   Parkinson's disease Mother    Stroke Father    Dementia Father    Colon cancer Neg Hx    Esophageal cancer Neg Hx    Rectal cancer Neg Hx    Colon polyps Neg Hx    Stomach cancer Neg Hx     Social History: Social History   Tobacco Use   Smoking status: Never   Smokeless tobacco: Never  Vaping Use   Vaping status: Never Used  Substance Use Topics   Alcohol  use: Yes    Comment: Wine couple times a week   Drug use: Never   Social History   Social History Narrative   Are you right handed or left handed? Right handed    Are you currently employed ? No    What is your current occupation? Retired    Do you live at home alone? No    Who lives with you? Wife    What type of home do you live in: 1 story or 2 story? Lives in one story home - townhome        Vital Signs:  BP 129/73   Pulse 82   Ht 5' 7.35" (1.711 m)   Wt 145 lb (65.8 kg)   SpO2 95%   BMI 22.47 kg/m   Neurological Exam: MENTAL STATUS including orientation to time, place, person, recent and remote memory, attention span and concentration, language, and fund of knowledge is normal.  Speech is not dysarthric.  CRANIAL NERVES: II:  No visual field defects.     III-IV-VI: Pupils equal round and reactive to light.  Normal conjugate, extra-ocular eye movements in all directions of gaze.  No nystagmus.  No ptosis.   V:  Normal facial sensation.    VII:  Normal facial symmetry and movements.   VIII:  Normal hearing and vestibular function.   IX-X:  Normal palatal movement.   XI:  Normal shoulder shrug and head rotation.   XII:  Normal tongue strength and range of motion, no deviation or fasciculation.  MOTOR:  Motor strength is 5/5  throughout. No atrophy, fasciculations or abnormal movements.  No pronator drift.   MSRs:                                           Right        Left brachioradialis 2+  2+  biceps 2+  2+  triceps 2+  2+  patellar 2+  2+  ankle jerk 2+  2+  plantar response down  down  SENSORY:  Normal and symmetric perception of light touch, vibration, and temperature  COORDINATION/GAIT: Normal finger-to- nose-finger.  Intact rapid alternating movements bilaterally.   Gait narrow based and stable. Tandem and stressed gait intact.     Thank you for allowing me to participate in patient's care.  If I can answer any additional questions, I would be pleased to do so.    Sincerely,    Bruchy Mikel K. Lydia Sams, DO

## 2023-08-31 NOTE — Patient Instructions (Signed)
 You may take meclizine  25mg  as needed for severe dizziness

## 2023-09-08 DIAGNOSIS — R8271 Bacteriuria: Secondary | ICD-10-CM | POA: Diagnosis not present

## 2023-09-08 DIAGNOSIS — R350 Frequency of micturition: Secondary | ICD-10-CM | POA: Diagnosis not present

## 2023-09-16 DIAGNOSIS — N211 Calculus in urethra: Secondary | ICD-10-CM | POA: Diagnosis not present

## 2023-09-16 DIAGNOSIS — N201 Calculus of ureter: Secondary | ICD-10-CM | POA: Diagnosis not present

## 2023-09-16 DIAGNOSIS — N368 Other specified disorders of urethra: Secondary | ICD-10-CM | POA: Diagnosis not present

## 2023-09-16 DIAGNOSIS — N35914 Unspecified anterior urethral stricture, male: Secondary | ICD-10-CM | POA: Diagnosis not present

## 2023-09-16 DIAGNOSIS — Q540 Hypospadias, balanic: Secondary | ICD-10-CM | POA: Diagnosis not present

## 2023-09-16 DIAGNOSIS — L738 Other specified follicular disorders: Secondary | ICD-10-CM | POA: Diagnosis not present

## 2023-09-18 DIAGNOSIS — N410 Acute prostatitis: Secondary | ICD-10-CM | POA: Diagnosis not present

## 2023-09-30 DIAGNOSIS — N35011 Post-traumatic bulbous urethral stricture: Secondary | ICD-10-CM | POA: Diagnosis not present

## 2023-09-30 DIAGNOSIS — R350 Frequency of micturition: Secondary | ICD-10-CM | POA: Diagnosis not present

## 2023-09-30 DIAGNOSIS — R8271 Bacteriuria: Secondary | ICD-10-CM | POA: Diagnosis not present

## 2023-10-06 NOTE — Progress Notes (Unsigned)
 Follow Up Note  RE: Nicholas Terry MRN: [id number] DOB: July 01, 1951 Date of Office Visit: 10/07/2023  Referring provider: Charlane Ferretti, DO Primary care provider: Charlane Ferretti, DO  Chief Complaint: No chief complaint on file.  History of Present Illness: I had the pleasure of seeing Nicholas Terry for a follow up visit at the [facility] on 10/06/2023. He is a 73 y.o. male, who is being followed for allergic rhinitis, vertigo. His previous allergy office visit was on 06/10/2023 with Dr. Selena Batten. Today is a regular follow up visit.  Discussed the use of AI scribe software for clinical note transcription with the patient, who gave verbal consent to proceed.  History of Present Illness            ***  Assessment and Plan: Nicholas Terry is a 73 y.o. male with: Seasonal allergic rhinitis due to pollen Allergic rhinitis due to dust mite Allergic rhinitis due to mold Past history - Increased symptoms in the fall, including rhinorrhea, itchy eyes, and lightheadedness. Currently using Zyrtec and Flonase as needed, with previous use of Claritin.  Today's skin testing positive to weed, trees, perennial mold mix 2, dust mites.  Start environmental control measures as below. Use over the counter antihistamines such as Zyrtec (cetirizine), Claritin (loratadine), Allegra (fexofenadine), or Xyzal (levocetirizine) daily as needed. May switch antihistamines every few months. Use Flonase (fluticasone) nasal spray 1-2 sprays per nostril once a day as needed for nasal congestion.  Nasal saline spray (i.e., Simply Saline) or nasal saline lavage (i.e., NeilMed) is recommended as needed and prior to medicated nasal sprays. 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 Offered a different nasal spray rx for this but patient declined as not that bothersome.   Vertigo Past history - four episodes of vertigo over the past year, with associated nausea and  vomiting. No clear triggers identified. No history of stroke-like symptoms. No HTN. Previous evaluation by ENT and physical therapy without clear diagnosis. Refer to neurology for further evaluation and management. Assessment and Plan              No follow-ups on file.  No orders of the defined types were placed in this encounter.  Lab Orders  No laboratory test(s) ordered today    Diagnostics: Spirometry:  Tracings reviewed. His effort: {Blank single:19197::"Good reproducible efforts.","It was hard to get consistent efforts and there is a question as to whether this reflects a maximal maneuver.","Poor effort, data can not be interpreted."} FVC: ***L FEV1: ***L, ***% predicted FEV1/FVC ratio: ***% Interpretation: {Blank single:19197::"Spirometry consistent with mild obstructive disease","Spirometry consistent with moderate obstructive disease","Spirometry consistent with severe obstructive disease","Spirometry consistent with possible restrictive disease","Spirometry consistent with mixed obstructive and restrictive disease","Spirometry uninterpretable due to technique","Spirometry consistent with normal pattern","No overt abnormalities noted given today's efforts"}.  Please see scanned spirometry results for details.  Skin Testing: {Blank single:19197::"Select foods","Environmental allergy panel","Environmental allergy panel and select foods","Food allergy panel","None","Deferred due to recent antihistamines use"}. *** Results discussed with patient/family.   Medication List:  Current Outpatient Medications  Medication Sig Dispense Refill  . acetaminophen (TYLENOL) 650 MG CR tablet Take 650 mg by mouth daily.    . Cranberry 125 MG TABS Take by mouth.    . fluticasone (FLONASE) 50 MCG/ACT nasal spray Place into both nostrils daily.    . Lactobacillus Rhamnosus, GG, (CULTURELLE PO) Take by mouth.    . loratadine (CLARITIN) 10 MG tablet Take 10 mg by mouth daily.    Marland Kitchen  meclizine  (ANTIVERT) 25 MG tablet Take 1 tablet (25 mg total) by mouth 3 (three) times daily as needed for dizziness. 30 tablet 0  . Multiple Vitamin (MULTIVITAMIN WITH MINERALS) TABS tablet Take 1 tablet by mouth daily.    . tamsulosin (FLOMAX) 0.4 MG CAPS capsule Take 0.4 mg by mouth.    Carlena Hurl 10 MG TABS tablet Take 10 mg by mouth daily.      No current facility-administered medications for this visit.   Allergies: Allergies  Allergen Reactions  . Sulfa Antibiotics Rash   I reviewed his past medical history, social history, family history, and environmental history and no significant changes have been reported from his previous visit.  Review of Systems  Constitutional:  Negative for appetite change, chills, fever and unexpected weight change.  HENT:  Positive for rhinorrhea. Negative for congestion.   Eyes:  Negative for itching.  Respiratory:  Negative for cough, chest tightness, shortness of breath and wheezing.   Cardiovascular:  Negative for chest pain.  Gastrointestinal:  Negative for abdominal pain.  Genitourinary:  Negative for difficulty urinating.  Skin:  Negative for rash.  Neurological:  Positive for dizziness, light-headedness and headaches.   Objective: There were no vitals taken for this visit. There is no height or weight on file to calculate BMI. Physical Exam Vitals and nursing note reviewed.  Constitutional:      Appearance: Normal appearance. He is well-developed.  HENT:     Head: Normocephalic and atraumatic.     Right Ear: Tympanic membrane and external ear normal.     Left Ear: Tympanic membrane and external ear normal.     Nose: Nose normal.     Mouth/Throat:     Mouth: Mucous membranes are moist.     Pharynx: Oropharynx is clear.  Eyes:     Conjunctiva/sclera: Conjunctivae normal.  Cardiovascular:     Rate and Rhythm: Normal rate and regular rhythm.     Heart sounds: Normal heart sounds. No murmur heard.    No friction rub. No gallop.  Pulmonary:      Effort: Pulmonary effort is normal.     Breath sounds: Normal breath sounds. No wheezing, rhonchi or rales.  Musculoskeletal:     Cervical back: Neck supple.  Skin:    General: Skin is warm.     Findings: No rash.  Neurological:     Mental Status: He is alert and oriented to person, place, and time.  Psychiatric:        Behavior: Behavior normal.  Previous notes and tests were reviewed. The plan was reviewed with the patient/family, and all questions/concerned were addressed.  It was my pleasure to see Nicholas Terry today and participate in his care. Please feel free to contact me with any questions or concerns.  Sincerely,  Wyline Mood, DO Allergy & Immunology  [facility] office: [phone] [facility] office: [phone]

## 2023-10-07 ENCOUNTER — Encounter: Payer: Self-pay | Admitting: Allergy

## 2023-10-07 ENCOUNTER — Ambulatory Visit: Payer: Medicare PPO | Admitting: Allergy

## 2023-10-07 ENCOUNTER — Other Ambulatory Visit: Payer: Self-pay

## 2023-10-07 VITALS — BP 108/60 | HR 86 | Resp 16

## 2023-10-07 DIAGNOSIS — J310 Chronic rhinitis: Secondary | ICD-10-CM

## 2023-10-07 DIAGNOSIS — J3089 Other allergic rhinitis: Secondary | ICD-10-CM | POA: Diagnosis not present

## 2023-10-07 DIAGNOSIS — J301 Allergic rhinitis due to pollen: Secondary | ICD-10-CM

## 2023-10-07 DIAGNOSIS — H8110 Benign paroxysmal vertigo, unspecified ear: Secondary | ICD-10-CM

## 2023-10-07 NOTE — Patient Instructions (Addendum)
 Environmental allergies 2024 skin testing positive to weed, trees, perennial mold mix 2, dust mites.  Continue environmental control measures. Use over the counter antihistamines such as Zyrtec (cetirizine), Claritin (loratadine), Allegra (fexofenadine), or Xyzal (levocetirizine) daily as needed. May switch antihistamines every few months. Use Flonase (fluticasone) nasal spray 1-2 sprays per nostril once a day as needed for nasal congestion.  Nasal saline spray (i.e., Simply Saline) or nasal saline lavage (i.e., NeilMed) is recommended as needed and prior to medicated nasal sprays.  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 This is not caused by food allergies. If it becomes bothersome I can prescribe a different nasal spray for this.   Follow up in October if needed.

## 2023-10-21 DIAGNOSIS — N390 Urinary tract infection, site not specified: Secondary | ICD-10-CM | POA: Diagnosis not present

## 2023-10-27 DIAGNOSIS — N35011 Post-traumatic bulbous urethral stricture: Secondary | ICD-10-CM | POA: Diagnosis not present

## 2023-11-04 DIAGNOSIS — N3000 Acute cystitis without hematuria: Secondary | ICD-10-CM | POA: Diagnosis not present

## 2023-11-04 DIAGNOSIS — N35011 Post-traumatic bulbous urethral stricture: Secondary | ICD-10-CM | POA: Diagnosis not present

## 2023-11-10 DIAGNOSIS — R3916 Straining to void: Secondary | ICD-10-CM | POA: Diagnosis not present

## 2023-11-10 DIAGNOSIS — N3000 Acute cystitis without hematuria: Secondary | ICD-10-CM | POA: Diagnosis not present

## 2023-11-10 DIAGNOSIS — N35011 Post-traumatic bulbous urethral stricture: Secondary | ICD-10-CM | POA: Diagnosis not present

## 2023-11-10 DIAGNOSIS — R350 Frequency of micturition: Secondary | ICD-10-CM | POA: Diagnosis not present

## 2023-11-10 DIAGNOSIS — R3912 Poor urinary stream: Secondary | ICD-10-CM | POA: Diagnosis not present

## 2023-11-24 DIAGNOSIS — Z85828 Personal history of other malignant neoplasm of skin: Secondary | ICD-10-CM | POA: Diagnosis not present

## 2023-11-24 DIAGNOSIS — L812 Freckles: Secondary | ICD-10-CM | POA: Diagnosis not present

## 2023-11-24 DIAGNOSIS — L821 Other seborrheic keratosis: Secondary | ICD-10-CM | POA: Diagnosis not present

## 2023-11-24 DIAGNOSIS — L723 Sebaceous cyst: Secondary | ICD-10-CM | POA: Diagnosis not present

## 2023-11-24 DIAGNOSIS — D1801 Hemangioma of skin and subcutaneous tissue: Secondary | ICD-10-CM | POA: Diagnosis not present

## 2023-11-24 DIAGNOSIS — L570 Actinic keratosis: Secondary | ICD-10-CM | POA: Diagnosis not present

## 2023-12-10 DIAGNOSIS — R350 Frequency of micturition: Secondary | ICD-10-CM | POA: Diagnosis not present

## 2023-12-10 DIAGNOSIS — R3916 Straining to void: Secondary | ICD-10-CM | POA: Diagnosis not present

## 2023-12-10 DIAGNOSIS — N3000 Acute cystitis without hematuria: Secondary | ICD-10-CM | POA: Diagnosis not present

## 2023-12-10 DIAGNOSIS — N35011 Post-traumatic bulbous urethral stricture: Secondary | ICD-10-CM | POA: Diagnosis not present

## 2023-12-11 ENCOUNTER — Other Ambulatory Visit: Payer: Self-pay | Admitting: Urology

## 2023-12-15 NOTE — Patient Instructions (Addendum)
 SURGICAL WAITING ROOM VISITATION  Patients having surgery or a procedure may have no more than 2 support people in the waiting area - these visitors may rotate.    Children under the age of 95 must have an adult with them who is not the patient.  Visitors with respiratory illnesses are discouraged from visiting and should remain at home.  If the patient needs to stay at the hospital during part of their recovery, the visitor guidelines for inpatient rooms apply. Pre-op nurse will coordinate an appropriate time for 1 support person to accompany patient in pre-op.  This support person may not rotate.    Please refer to the [facility] website for the visitor guidelines for Inpatients (after your surgery is over and you are in a regular room).       Your procedure is scheduled on:  12-18-23   Report to [facility] Main Entrance    Report to admitting at     0530   AM   Call this number if you have problems the morning of surgery [phone]   Do not eat food or drink liquids  :After Midnight.        .          If you have questions, please contact your surgeon's office.   FOLLOW  ANY ADDITIONAL PRE OP INSTRUCTIONS YOU RECEIVED FROM YOUR SURGEON'S OFFICE!!!     Oral Hygiene is also important to reduce your risk of infection.                                    Remember - BRUSH YOUR TEETH THE MORNING OF SURGERY WITH YOUR REGULAR TOOTHPASTE  DENTURES WILL BE REMOVED PRIOR TO SURGERY PLEASE DO NOT APPLY "Poly grip" OR ADHESIVES!!!   Do NOT smoke after Midnight   Stop all vitamins and herbal supplements 7 days before surgery.   Take these medicines the morning of surgery with A SIP OF WATER : Trimethoprim, tamsulosin, nitrofurantoin, loratadine  and tylenol  if needed                                You may not have any metal on your body including hair pins, jewelry, and body piercing             Do not wear  lotions, powders, perfumes/cologne, or deodorant                Men may shave face and neck.   Do not bring valuables to the hospital. [facility] IS NOT             RESPONSIBLE   FOR VALUABLES.   Contacts, glasses, dentures or bridgework may not be worn into surgery.   Bring small overnight bag day of surgery.   DO NOT BRING YOUR HOME MEDICATIONS TO THE HOSPITAL. PHARMACY WILL DISPENSE MEDICATIONS LISTED ON YOUR MEDICATION LIST TO YOU DURING YOUR ADMISSION IN THE HOSPITAL!    Patients discharged on the day of surgery will not be allowed to drive home.  Someone NEEDS to stay with you for the first 24 hours after anesthesia.   Special Instructions: Bring a copy of your healthcare power of attorney and living will documents the day of surgery if you haven't scanned them before.              Please read  over the following fact sheets you were given: IF YOU HAVE QUESTIONS ABOUT YOUR PRE-OP INSTRUCTIONS PLEASE CALL [phone]    If you test positive for Covid or have been in contact with anyone that has tested positive in the last 10 days please notify you surgeon.    [facility] - Preparing for Surgery Before surgery, you can play an important role.  Because skin is not sterile, your skin needs to be as free of germs as possible.  You can reduce the number of germs on your skin by washing with CHG (chlorahexidine gluconate) soap before surgery.  CHG is an antiseptic cleaner which kills germs and bonds with the skin to continue killing germs even after washing. Please DO NOT use if you have an allergy  to CHG or antibacterial soaps.  If your skin becomes reddened/irritated stop using the CHG and inform your nurse when you arrive at Short Stay. Do not shave (including legs and underarms) for at least 48 hours prior to the first CHG shower.  You may shave your face/neck. Please follow these instructions carefully:  1.  Shower with CHG Soap the night before surgery and the  morning of Surgery.  2.  If you choose to wash your hair, wash your hair first as  usual with your  normal  shampoo.  3.  After you shampoo, rinse your hair and body thoroughly to remove the  shampoo.                           4.  Use CHG as you would any other liquid soap.  You can apply chg directly  to the skin and wash                       Gently with a scrungie or clean washcloth.  5.  Apply the CHG Soap to your body ONLY FROM THE NECK DOWN.   Do not use on face/ open                           Wound or open sores. Avoid contact with eyes, ears mouth and genitals (private parts).                       Wash face,  Genitals (private parts) with your normal soap.             6.  Wash thoroughly, paying special attention to the area where your surgery  will be performed.  7.  Thoroughly rinse your body with warm water  from the neck down.  8.  DO NOT shower/wash with your normal soap after using and rinsing off  the CHG Soap.                9.  Pat yourself dry with a clean towel.            10.  Wear clean pajamas.            11.  Place clean sheets on your bed the night of your first shower and do not  sleep with pets. Day of Surgery : Do not apply any lotions/deodorants the morning of surgery.  Please wear clean clothes to the hospital/surgery center.  FAILURE TO FOLLOW THESE INSTRUCTIONS MAY RESULT IN THE CANCELLATION OF YOUR SURGERY PATIENT SIGNATURE_________________________________  NURSE SIGNATURE__________________________________  ________________________________________________________________________

## 2023-12-15 NOTE — Progress Notes (Addendum)
 PCP -Windell Hasty ,DO Cardiologist -   PPM/ICD -  Device Orders -  Rep Notified -   Chest x-ray -  EKG -  Stress Test -  ECHO -  Cardiac Cath -   Sleep Study -  CPAP -   Fasting Blood Sugar -  Checks Blood Sugar _____ times a day  Blood Thinner Instructions:Xarelto  Aspirin Instructions:  ERAS Protcol - PRE-SURGERY n/a   COVID vaccine -  Activity-- Anesthesia review: DVT  Patient denies shortness of breath, fever, cough and chest pain at PAT appointment   All instructions explained to the patient, with a verbal understanding of the material. Patient agrees to go over the instructions while at home for a better understanding. Patient also instructed to self quarantine after being tested for COVID-19. The opportunity to ask questions was provided.

## 2023-12-17 ENCOUNTER — Encounter (HOSPITAL_COMMUNITY): Payer: Self-pay

## 2023-12-17 ENCOUNTER — Encounter (HOSPITAL_COMMUNITY)
Admission: RE | Admit: 2023-12-17 | Discharge: 2023-12-17 | Disposition: A | Discharge status: Home / Self Care | Source: Ambulatory Visit | Attending: Urology | Admitting: Urology

## 2023-12-17 ENCOUNTER — Other Ambulatory Visit: Payer: Self-pay

## 2023-12-17 VITALS — BP 122/75 | HR 73 | Temp 98.4°F | Resp 16 | Ht 69.0 in | Wt 147.0 lb

## 2023-12-17 DIAGNOSIS — Z7901 Long term (current) use of anticoagulants: Secondary | ICD-10-CM | POA: Diagnosis not present

## 2023-12-17 DIAGNOSIS — Z01818 Encounter for other preprocedural examination: Secondary | ICD-10-CM

## 2023-12-17 DIAGNOSIS — Z01812 Encounter for preprocedural laboratory examination: Secondary | ICD-10-CM | POA: Diagnosis not present

## 2023-12-17 LAB — CBC
HCT: 37.5 % — ABNORMAL LOW (ref 39.0–52.0)
Hemoglobin: 12 g/dL — ABNORMAL LOW (ref 13.0–17.0)
MCH: 32 pg (ref 26.0–34.0)
MCHC: 32 g/dL (ref 30.0–36.0)
MCV: 100 fL (ref 80.0–100.0)
Platelets: 258 10*3/uL (ref 150–400)
RBC: 3.75 MIL/uL — ABNORMAL LOW (ref 4.22–5.81)
RDW: 13.3 % (ref 11.5–15.5)
WBC: 5 10*3/uL (ref 4.0–10.5)
nRBC: 0 % (ref 0.0–0.2)

## 2023-12-17 LAB — BASIC METABOLIC PANEL WITH GFR
Anion gap: 5 (ref 5–15)
BUN: 23 mg/dL (ref 8–23)
CO2: 28 mmol/L (ref 22–32)
Calcium: 9.2 mg/dL (ref 8.9–10.3)
Chloride: 105 mmol/L (ref 98–111)
Creatinine, Ser: 1.28 mg/dL — ABNORMAL HIGH (ref 0.61–1.24)
GFR, Estimated: 59 mL/min — ABNORMAL LOW (ref >60)
Glucose, Bld: 102 mg/dL — ABNORMAL HIGH (ref 70–99)
Potassium: 4.4 mmol/L (ref 3.5–5.1)
Sodium: 138 mmol/L (ref 135–145)

## 2023-12-17 MED ORDER — GENTAMICIN SULFATE 40 MG/ML IJ SOLN
5.0000 mg/kg | INTRAVENOUS | Status: AC
  Administered 2023-12-18: 329.2 mg via INTRAVENOUS
  Filled 2023-12-17: qty 8.25

## 2023-12-17 NOTE — Anesthesia Preprocedure Evaluation (Signed)
 Anesthesia Evaluation  Patient identified by MRN, date of birth, ID band Patient awake    Reviewed: Allergy  & Precautions, NPO status , Patient's Chart, lab work & pertinent test results  History of Anesthesia Complications (+) PONV and history of anesthetic complications  Airway Mallampati: II  TM Distance: >3 FB Neck ROM: Full    Dental no notable dental hx. (+) Dental Advisory Given, Teeth Intact   Pulmonary pneumonia, neg recent URI   Pulmonary exam normal breath sounds clear to auscultation       Cardiovascular + DVT (hx on Xarelto )  Normal cardiovascular exam Rhythm:Regular Rate:Normal  1020 EKG SR R 65   Neuro/Psych    GI/Hepatic negative GI ROS, Neg liver ROS,neg GERD  ,,  Endo/Other  negative endocrine ROS    Renal/GU negative Renal ROSK+ 4.2 Cr 0.89     Musculoskeletal  (+) Arthritis ,    Abdominal   Peds  Hematology negative hematology ROS (+) Hgb 15.2 Plt 228   Anesthesia Other Findings NKDA   Reproductive/Obstetrics                             Anesthesia Physical Anesthesia Plan  ASA: 3  Anesthesia Plan: General   Post-op Pain Management: Tylenol  PO (pre-op)*   Induction: Intravenous  PONV Risk Score and Plan: 4 or greater and Treatment may vary due to age or medical condition, Ondansetron  and Dexamethasone   Airway Management Planned: LMA  Additional Equipment: None  Intra-op Plan:   Post-operative Plan: Extubation in OR  Informed Consent: I have reviewed the patients History and Physical, chart, labs and discussed the procedure including the risks, benefits and alternatives for the proposed anesthesia with the patient or authorized representative who has indicated his/her understanding and acceptance.     Dental advisory given  Plan Discussed with: CRNA  Anesthesia Plan Comments: (Pt requests scopolamine . I explained the risks in detail, but he was  adamant that is what he needs to prevent PONV.  Risks of anesthesia explained at length. This includes, but is not limited to, sore throat, damage to teeth, lips gums, tongue and vocal cords, nausea and vomiting, reactions to medications, stroke, heart attack, and death. All patient questions were answered and the patient wishes to proceed. )        Anesthesia Quick Evaluation

## 2023-12-18 ENCOUNTER — Ambulatory Visit (HOSPITAL_COMMUNITY): Payer: Self-pay | Admitting: Anesthesiology

## 2023-12-18 ENCOUNTER — Ambulatory Visit (HOSPITAL_COMMUNITY)
Admission: RE | Admit: 2023-12-18 | Discharge: 2023-12-18 | Disposition: A | Discharge status: Home / Self Care | Attending: Urology | Admitting: Urology

## 2023-12-18 ENCOUNTER — Ambulatory Visit (HOSPITAL_COMMUNITY)

## 2023-12-18 ENCOUNTER — Encounter (HOSPITAL_COMMUNITY): Payer: Self-pay | Admitting: Urology

## 2023-12-18 ENCOUNTER — Encounter (HOSPITAL_COMMUNITY)
Admission: RE | Disposition: A | Discharge status: Home / Self Care | Payer: Self-pay | Source: Home / Self Care | Attending: Urology

## 2023-12-18 ENCOUNTER — Ambulatory Visit (HOSPITAL_BASED_OUTPATIENT_CLINIC_OR_DEPARTMENT_OTHER): Payer: Self-pay | Admitting: Anesthesiology

## 2023-12-18 DIAGNOSIS — J189 Pneumonia, unspecified organism: Secondary | ICD-10-CM | POA: Diagnosis not present

## 2023-12-18 DIAGNOSIS — Z87710 Personal history of (corrected) hypospadias: Secondary | ICD-10-CM | POA: Insufficient documentation

## 2023-12-18 DIAGNOSIS — N35919 Unspecified urethral stricture, male, unspecified site: Secondary | ICD-10-CM | POA: Diagnosis not present

## 2023-12-18 DIAGNOSIS — Z7901 Long term (current) use of anticoagulants: Secondary | ICD-10-CM | POA: Diagnosis not present

## 2023-12-18 DIAGNOSIS — T190XXA Foreign body in urethra, initial encounter: Secondary | ICD-10-CM

## 2023-12-18 DIAGNOSIS — N35111 Postinfective urethral stricture, not elsewhere classified, male, meatal: Secondary | ICD-10-CM

## 2023-12-18 DIAGNOSIS — Z86718 Personal history of other venous thrombosis and embolism: Secondary | ICD-10-CM | POA: Insufficient documentation

## 2023-12-18 DIAGNOSIS — W448XXA Other foreign body entering into or through a natural orifice, initial encounter: Secondary | ICD-10-CM | POA: Insufficient documentation

## 2023-12-18 DIAGNOSIS — M1990 Unspecified osteoarthritis, unspecified site: Secondary | ICD-10-CM | POA: Insufficient documentation

## 2023-12-18 DIAGNOSIS — N35912 Unspecified bulbous urethral stricture, male: Secondary | ICD-10-CM | POA: Diagnosis not present

## 2023-12-18 DIAGNOSIS — Z87440 Personal history of urinary (tract) infections: Secondary | ICD-10-CM | POA: Diagnosis not present

## 2023-12-18 HISTORY — PX: INSERTION OF SUPRAPUBIC CATHETER: SHX5870

## 2023-12-18 HISTORY — PX: CYSTOSCOPY WITH HOLMIUM LASER LITHOTRIPSY: SHX6639

## 2023-12-18 HISTORY — PX: BALLOON DILATION: SHX5330

## 2023-12-18 SURGERY — CYSTOSCOPY, WITH HOLMIUM LASER LITHOTRIPSY
Anesthesia: General

## 2023-12-18 MED ORDER — EPHEDRINE SULFATE-NACL 50-0.9 MG/10ML-% IV SOSY
PREFILLED_SYRINGE | INTRAVENOUS | Status: DC | PRN
  Administered 2023-12-18 (×5): 5 mg via INTRAVENOUS

## 2023-12-18 MED ORDER — FENTANYL CITRATE PF 50 MCG/ML IJ SOSY: 25.0000 ug | PREFILLED_SYRINGE | INTRAMUSCULAR | Status: DC | PRN

## 2023-12-18 MED ORDER — PROPOFOL 10 MG/ML IV BOLUS
INTRAVENOUS | Status: DC | PRN
  Administered 2023-12-18: 120 mg via INTRAVENOUS
  Administered 2023-12-18: 20 mg via INTRAVENOUS

## 2023-12-18 MED ORDER — LIDOCAINE HCL URETHRAL/MUCOSAL 2 % EX GEL
CUTANEOUS | Status: AC
Start: 2023-12-18 — End: ?
  Filled 2023-12-18: qty 5

## 2023-12-18 MED ORDER — LACTATED RINGERS IV SOLN: INTRAVENOUS | Status: DC

## 2023-12-18 MED ORDER — CHLORHEXIDINE GLUCONATE 0.12 % MT SOLN
15.0000 mL | Freq: Once | OROMUCOSAL | Status: AC
  Administered 2023-12-18: 15 mL via OROMUCOSAL

## 2023-12-18 MED ORDER — SCOPOLAMINE 1 MG/3DAYS TD PT72
MEDICATED_PATCH | TRANSDERMAL | Status: AC
  Filled 2023-12-18: qty 1

## 2023-12-18 MED ORDER — TRAMADOL HCL 50 MG PO TABS: 50.0000 mg | ORAL_TABLET | Freq: Four times a day (QID) | ORAL | 0 refills | Status: DC | PRN

## 2023-12-18 MED ORDER — EPHEDRINE 5 MG/ML INJ
INTRAVENOUS | Status: AC
  Filled 2023-12-18: qty 5

## 2023-12-18 MED ORDER — PHENYLEPHRINE 80 MCG/ML (10ML) SYRINGE FOR IV PUSH (FOR BLOOD PRESSURE SUPPORT)
PREFILLED_SYRINGE | INTRAVENOUS | Status: DC | PRN
  Administered 2023-12-18: 40 ug via INTRAVENOUS
  Administered 2023-12-18: 80 ug via INTRAVENOUS
  Administered 2023-12-18: 40 ug via INTRAVENOUS
  Administered 2023-12-18: 80 ug via INTRAVENOUS
  Administered 2023-12-18: 40 ug via INTRAVENOUS
  Administered 2023-12-18 (×2): 80 ug via INTRAVENOUS

## 2023-12-18 MED ORDER — PROPOFOL 10 MG/ML IV BOLUS
INTRAVENOUS | Status: AC
  Filled 2023-12-18: qty 20

## 2023-12-18 MED ORDER — LIDOCAINE HCL (CARDIAC) PF 100 MG/5ML IV SOSY
PREFILLED_SYRINGE | INTRAVENOUS | Status: DC | PRN
Start: 2023-12-18 — End: 2023-12-18
  Administered 2023-12-18: 60 mg via INTRAVENOUS

## 2023-12-18 MED ORDER — ORAL CARE MOUTH RINSE: 15.0000 mL | Freq: Once | OROMUCOSAL | Status: AC

## 2023-12-18 MED ORDER — DROPERIDOL 2.5 MG/ML IJ SOLN: 0.6250 mg | Freq: Once | INTRAMUSCULAR | Status: DC | PRN

## 2023-12-18 MED ORDER — OXYCODONE HCL 5 MG PO TABS
ORAL_TABLET | ORAL | Status: AC
  Filled 2023-12-18: qty 1

## 2023-12-18 MED ORDER — IOHEXOL 300 MG/ML  SOLN
INTRAMUSCULAR | Status: DC | PRN
Start: 2023-12-18 — End: 2023-12-18
  Administered 2023-12-18: 6 mL

## 2023-12-18 MED ORDER — ONDANSETRON HCL 4 MG/2ML IJ SOLN
INTRAMUSCULAR | Status: AC
  Filled 2023-12-18: qty 2

## 2023-12-18 MED ORDER — FENTANYL CITRATE (PF) 100 MCG/2ML IJ SOLN
INTRAMUSCULAR | Status: DC | PRN
  Administered 2023-12-18 (×3): 25 ug via INTRAVENOUS
  Administered 2023-12-18: 50 ug via INTRAVENOUS
  Administered 2023-12-18: 25 ug via INTRAVENOUS

## 2023-12-18 MED ORDER — PHENYLEPHRINE 80 MCG/ML (10ML) SYRINGE FOR IV PUSH (FOR BLOOD PRESSURE SUPPORT)
PREFILLED_SYRINGE | INTRAVENOUS | Status: AC
  Filled 2023-12-18: qty 10

## 2023-12-18 MED ORDER — STERILE WATER FOR IRRIGATION IR SOLN
Status: DC | PRN
  Administered 2023-12-18: 50 mL

## 2023-12-18 MED ORDER — OXYCODONE HCL 5 MG/5ML PO SOLN: 5.0000 mg | Freq: Once | ORAL | Status: AC | PRN

## 2023-12-18 MED ORDER — OXYCODONE HCL 5 MG PO TABS
5.0000 mg | ORAL_TABLET | Freq: Once | ORAL | Status: AC | PRN
  Administered 2023-12-18: 5 mg via ORAL

## 2023-12-18 MED ORDER — DEXAMETHASONE SODIUM PHOSPHATE 10 MG/ML IJ SOLN
INTRAMUSCULAR | Status: DC | PRN
  Administered 2023-12-18: 4 mg via INTRAVENOUS

## 2023-12-18 MED ORDER — SODIUM CHLORIDE 0.9 % IR SOLN
Status: DC | PRN
  Administered 2023-12-18: 20000 mL

## 2023-12-18 MED ORDER — ACETAMINOPHEN 500 MG PO TABS
1000.0000 mg | ORAL_TABLET | Freq: Once | ORAL | Status: DC
  Filled 2023-12-18: qty 2

## 2023-12-18 MED ORDER — FENTANYL CITRATE (PF) 100 MCG/2ML IJ SOLN
INTRAMUSCULAR | Status: AC
  Filled 2023-12-18: qty 2

## 2023-12-18 MED ORDER — PHENAZOPYRIDINE HCL 200 MG PO TABS: 200.0000 mg | ORAL_TABLET | Freq: Three times a day (TID) | ORAL | 0 refills | Status: DC | PRN

## 2023-12-18 MED ORDER — SCOPOLAMINE 1 MG/3DAYS TD PT72
1.0000 | MEDICATED_PATCH | TRANSDERMAL | Status: DC
  Administered 2023-12-18: 1 via TRANSDERMAL

## 2023-12-18 MED ORDER — DEXAMETHASONE SODIUM PHOSPHATE 10 MG/ML IJ SOLN
INTRAMUSCULAR | Status: AC
  Filled 2023-12-18: qty 1

## 2023-12-18 SURGICAL SUPPLY — 31 items
BAG URINE DRAIN 2000ML AR STRL (UROLOGICAL SUPPLIES) ×1 IMPLANT
BAG URO CATCHER STRL LF (MISCELLANEOUS) ×1 IMPLANT
BALLOON NEPHROSTOMY (BALLOONS) IMPLANT
BALLOON OPTILUME DCB 30X5X75 (BALLOONS) IMPLANT
BASKET ZERO TIP NITINOL 2.4FR (BASKET) IMPLANT
CATH FOLEY 2W COUNCIL 20FR 5CC (CATHETERS) IMPLANT
CATH FOLEY 2WAY SLVR 5CC 14FR (CATHETERS) IMPLANT
CATH ROBINSON RED A/P 14FR (CATHETERS) IMPLANT
CATH URET 5FR 70CM CONE TIP (BALLOONS) IMPLANT
CATH URETL OPEN 5X70 (CATHETERS) ×1 IMPLANT
CATH URETL OPEN END 6FR 70 (CATHETERS) IMPLANT
CLOTH BEACON ORANGE TIMEOUT ST (SAFETY) ×1 IMPLANT
DEVICE INFLATION ATRION QL4015 (MISCELLANEOUS) IMPLANT
GLOVE BIO SURGEON STRL SZ7.5 (GLOVE) ×1 IMPLANT
GLOVE SURG LX STRL 7.5 STRW (GLOVE) ×1 IMPLANT
GOWN STRL REUS W/ TWL LRG LVL3 (GOWN DISPOSABLE) ×2 IMPLANT
GOWN STRL REUS W/ TWL XL LVL3 (GOWN DISPOSABLE) ×1 IMPLANT
GOWN STRL SURGICAL XL XLNG (GOWN DISPOSABLE) ×1 IMPLANT
GUIDEWIRE ANG ZIPWIRE 038X150 (WIRE) IMPLANT
GUIDEWIRE STR DUAL SENSOR (WIRE) ×1 IMPLANT
KIT TURNOVER KIT A (KITS) IMPLANT
MANIFOLD NEPTUNE II (INSTRUMENTS) ×1 IMPLANT
NS IRRIG 1000ML POUR BTL (IV SOLUTION) IMPLANT
PACK CYSTO (CUSTOM PROCEDURE TRAY) ×1 IMPLANT
SHEATH NAVIGATOR HD 12/14X36 (SHEATH) IMPLANT
SYR 10ML LL (SYRINGE) ×1 IMPLANT
SYRINGE TOOMEY IRRIG 70ML (MISCELLANEOUS) IMPLANT
TRACTIP FLEXIVA PULS ID 200XHI (Laser) IMPLANT
TUBING CONNECTING 10 (TUBING) ×1 IMPLANT
TUBING UROLOGY SET (TUBING) ×1 IMPLANT
WATER STERILE IRR 3000ML UROMA (IV SOLUTION) ×1 IMPLANT

## 2023-12-18 NOTE — Anesthesia Procedure Notes (Signed)
 Procedure Name: LMA Insertion Date/Time: 12/18/2023 7:57 AM  Performed by: Alwyn Juba, CRNAPre-anesthesia Checklist: Patient identified, Emergency Drugs available, Suction available, Patient being monitored and Timeout performed Patient Re-evaluated:Patient Re-evaluated prior to induction Oxygen Delivery Method: Circle system utilized Preoxygenation: Pre-oxygenation with 100% oxygen Induction Type: IV induction Ventilation: Mask ventilation without difficulty LMA: LMA inserted LMA Size: 4.0 Number of attempts: 1 Tube secured with: Tape Dental Injury: Teeth and Oropharynx as per pre-operative assessment

## 2023-12-18 NOTE — Anesthesia Postprocedure Evaluation (Signed)
 Anesthesia Post Note  [name]  Procedure(s) Performed: CYSTOSCOPY, WITH HOLMIUM LASER LITHOTRIPSY BALLOON DILATION INSERTION, SUPRAPUBIC CATHETER     Patient location during evaluation: PACU Anesthesia Type: General Level of consciousness: sedated and patient cooperative Pain management: pain level controlled Vital Signs Assessment: post-procedure vital signs reviewed and stable Respiratory status: spontaneous breathing Cardiovascular status: stable Anesthetic complications: no   No notable events documented.  Last Vitals:  Vitals:   12/18/23 1015 12/18/23 1024  BP: (!) 109/49 119/76  Pulse: 69 71  Resp: 15 16  Temp:  (!) 36.3 C  SpO2: 92% 93%    Last Pain:  Vitals:   12/18/23 1024  TempSrc: Oral  PainSc: 3                  Gorman Laughter

## 2023-12-18 NOTE — Interval H&P Note (Signed)
 History and Physical Interval Note: NAD RRR CTA-B  Okay to proceed.  12/18/2023 7:37 AM  Nicholas Terry  has presented today for surgery, with the diagnosis of URETHRAL STRICTURE.  The various methods of treatment have been discussed with the patient and family. After consideration of risks, benefits and other options for treatment, the patient has consented to  Procedure(s) with comments: CYSTOSCOPY, WITH HOLMIUM LASER LITHOTRIPSY (N/A) - CYSTOSCOPY, LASER LITHOTRIPSY, RETROGRADE PYELOGRAM, POSSIBLE OPTILUME BALLOON DILATION, POSSIBLE SUPRAPUBIC TUBE PLACEMENT BALLOON DILATION (N/A) INSERTION, SUPRAPUBIC CATHETER (N/A) as a surgical intervention.  The patient's history has been reviewed, patient examined, no change in status, stable for surgery.  I have reviewed the patient's chart and labs.  Questions were answered to the patient's satisfaction.     Andrez Banker

## 2023-12-18 NOTE — Transfer of Care (Signed)
 Immediate Anesthesia Transfer of Care Note  [name]  Procedure(s) Performed: CYSTOSCOPY, WITH HOLMIUM LASER LITHOTRIPSY BALLOON DILATION INSERTION, SUPRAPUBIC CATHETER  Patient Location: PACU  Anesthesia Type:General  Level of Consciousness: awake, alert , and patient cooperative  Airway & Oxygen Therapy: Patient Spontanous Breathing and Patient connected to face mask oxygen  Post-op Assessment: Report given to RN and Post -op Vital signs reviewed and stable  Post vital signs: Reviewed and stable  Last Vitals:  Vitals Value Taken Time  BP 115/83 12/18/23 0939  Temp    Pulse 73 12/18/23 0940  Resp 13 12/18/23 0940  SpO2 100 % 12/18/23 0940  Vitals shown include unfiled device data.  Last Pain:  Vitals:   12/18/23 0626  TempSrc:   PainSc: 4          Complications: No notable events documented.

## 2023-12-18 NOTE — H&P (Signed)
 73 year old who presents today for a rising PSA.   The patient was recently seen by Dr. Clarke Crouch and noted to have a slightly irregular prostate exam, but according to the patient this is been his baseline for a while. He had a negative prostate biopsy for it within the last 10 years. He did also note that his PSA which normally is around 0.7 had risen to 1.7 over the last year. It was then repeated here and was 2.0. As such, he was scheduled for follow-up in 6 weeks with a PSA prior. His most recent repeat PSA was 0.63.   The patient's past urologic history is significant for hypospadias. He had this repaired as a child. He is done very well from it, but has had hair in his urethra now for quite some time. These hair follicles tend to harbor  infection, and has prompted recurrent urinary tract infections. In addition, he occasionally will get calcifications along them. These have been treated by his former urologist in [place]. He is also seen urology at [facility]. There are no further recommendations or treatment options discussed.   March 2025 the patient underwent cystourethroscopy with laser lithotripsy of the urethral stone and the hair within the urethral lumen.   Interval: Today the patient is here for follow-up. He counted 1 time late last week where he voided 29 x 24 hours. Stream is very weak. He voids small amounts and has dysuria. Fortunately has not had any hematuria. His stream seems to get better as the day progresses, but initially in the morning time it is quite bad. He does not feel that he is emptying his bladder.       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 - 2018 Visit Complexity (formerly [facility]) - 11/10/2023, 11/04/2023, 10/27/2023, 04/28/2023     GU PMH: Acute Cystitis/UTI - 11/10/2023, - 11/04/2023 Bulbar urethral stricture - 11/10/2023, - 11/04/2023, - 10/27/2023, - 09/30/2023 Straining on Urination - 11/10/2023 Urinary Frequency - 11/10/2023, - 09/30/2023, - 09/08/2023, - 04/28/2023, - 04/21/2022, - 2022, - 2022, - 2022, - 2021 Weak Urinary Stream - 11/10/2023 Urinary Tract Inf, Unspec site - 10/27/2023, - 09/30/2023, - 08/15/2022, - 2023, - 2023, - 2022, - 2022, - 2021 Acute prostatitis - 09/18/2023, - 08/15/2022, - 2023, - 2023, - 2023 Nocturia - 04/28/2023, - 04/21/2022, - 2021 Epididymitis - 2023 Renal calculus    NON-GU PMH: Arthritis DVT, History GERD    FAMILY HISTORY: Death of family member - Father, Mother Dementia - Father Parkinson's Disease - Mother stroke - Father   SOCIAL HISTORY: Marital Status: Single Current Smoking Status: Patient has never smoked.   Tobacco Use Assessment Completed: Used Tobacco in last 30 days? Does not use smokeless tobacco. Drinks 1 drink per day.  Does not use drugs. Drinks 2 caffeinated drinks per day. Has not had a blood transfusion. 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     Notes: ETOH 4 glasses of wine per week    REVIEW OF SYSTEMS:    GU Review Male:   Patient denies frequent urination, hard to postpone urination, burning/ pain with urination, get up at night to urinate, leakage of urine, stream starts and stops, trouble starting your stream, have to strain to urinate , erection problems, and penile pain.  Gastrointestinal (Upper):   Patient denies nausea, vomiting, and indigestion/ heartburn.  Gastrointestinal  (Lower):   Patient denies diarrhea and constipation.  Constitutional:   Patient denies fever, night sweats, weight loss, and fatigue.  Skin:   Patient denies skin rash/ lesion and itching.  Eyes:   Patient denies blurred vision and double vision.  Ears/ Nose/ Throat:   Patient denies sore throat and sinus problems.  Hematologic/Lymphatic:   Patient denies swollen glands and easy bruising.  Cardiovascular:   Patient denies leg swelling and chest pains.  Respiratory:   Patient denies cough and shortness of breath.  Endocrine:   Patient denies excessive thirst.  Musculoskeletal:   Patient denies back pain and joint pain.  Neurological:   Patient denies headaches and dizziness.  Psychologic:   Patient denies depression and anxiety.   VITAL SIGNS: None   MULTI-SYSTEM PHYSICAL EXAMINATION:    Constitutional: Well-nourished. No physical deformities. Normally developed. Good grooming.  Respiratory: Normal breath sounds. No labored breathing, no use of accessory muscles.   Cardiovascular: Regular rate and rhythm. No murmur, no gallop. Normal temperature, normal extremity pulses, no swelling, no varicosities.      Complexity of Data:  Source Of History:  Patient  Records Review:   Previous Doctor Records, Previous Hospital Records, Previous Patient Records, POC Tool  Urine Test Review:   Urinalysis, Urine Culture  Urodynamics Review:   Review Bladder Scan   04/14/23 11/06/22 04/10/22 05/14/21 04/17/21  PSA  Total PSA 2.28 ng/mL 1.77 ng/mL 2.34 ng/mL 0.63 ng/mL 2.02 ng/mL    PROCEDURES:         PVR Ultrasound - 16109  Scanned Volume: 288 cc         Visit Complexity - G2211          Urinalysis w/Scope Dipstick Dipstick Cont'd Micro  Color: Orange Bilirubin: Invalid mg/dL WBC/hpf: 20 - 60/AVW  Appearance: Cloudy Ketones: Invalid mg/dL RBC/hpf: 0 - 2/hpf  Specific Gravity: Invalid Blood: Invalid ery/uL Bacteria: Many (>50/hpf)  pH: Invalid Protein: Invalid mg/dL Cystals: NS (Not Seen)   Glucose: Invalid mg/dL Urobilinogen: Invalid mg/dL Casts: NS (Not Seen)    Nitrites: Invalid Trichomonas: Not Present    Leukocyte Esterase: Invalid leu/uL Mucous: Not Present      Epithelial Cells: 0 - 5/hpf      Yeast: NS (Not Seen)      Sperm: Not Present    Notes: **Unspun Micro Only due to color interference**    ASSESSMENT:      ICD-10 Details  1 GU:   Bulbar urethral stricture - N35.011   2   Straining on Urination - R39.16   3   Urinary Tract Inf, Unspec site - N39.0   4   Urinary Frequency - R35.0    PLAN:            Medications New Meds: Nitrofurantoin Macrocrystal 100 MG Capsule 1 capsule PO BID   #20  0 Refill(s)  Trimethoprim 100 MG Tablet 1 tablet PO BID   #20  0 Refill(s)  Pharmacy Name:  [id number]  Address:  [address]   [place], [place] [address]  Phone:  [phone]  Fax:  [phone]            Orders Labs Urine Culture          Document Letter(s):  Created for Patient: Clinical Summary         Notes:   The patient has developed significant worsening of his frequency, urgency, and weak stream. I suspect he has developed recurrence of his stricture, and possibly has progression of the hair growth within his urethra. As a result of all this he has had a incompletely treated UTI.   The plan is to put the patient on trimethoprim and nitrofurantoin, and see if we can adequately treat his UTI. I sent the urine for culture to ensure that he is on the appropriate antibiotic, but we base his treatment on previous bacteria. He has had unfortunately a multidrug-resistant infection.   In addition, I plan to take the patient to the operating room first available to reevaluate the patient's urethra, and possibly perform laser urethrotomy or Optilume. Potentially the patient would need a suprapubic tube as well. We discussed all of this, and he is anxious to proceed.

## 2023-12-18 NOTE — Discharge Instructions (Signed)
 Foley Catheter Care A soft, flexible tube (Foley catheter) may have been placed in your bladder to drain urine and fluid. Follow these instructions: Taking Care of the Catheter Keep the area where the catheter leaves your body clean.  Attach the catheter to the leg so there is no tension on the catheter.  Keep the drainage bag below the level of the bladder, but keep it OFF the floor.  Do not take long soaking baths. Your caregiver will give instructions about showering.  Wash your hands before touching ANYTHING related to the catheter or bag.  Using mild soap and warm water  on a washcloth:  Clean the area closest to the catheter insertion site using a circular motion around the catheter.  Clean the catheter itself by wiping AWAY from the insertion site for several inches down the tube.  NEVER wipe upward as this could sweep bacteria up into the urethra (tube in your body that normally drains the bladder) and cause infection.  Place a small amount of sterile lubricant at the tip of the penis where the catheter is entering.  Taking Care of the Drainage Bags Two drainage bags may be taken home: a large overnight drainage bag, and a smaller leg bag which fits underneath clothing.  It is okay to wear the overnight bag at any time, but NEVER wear the smaller leg bag at night.  Keep the drainage bag well below the level of your bladder. This prevents backflow of urine into the bladder and allows the urine to drain freely.  Anchor the tubing to your leg to prevent pulling or tension on the catheter. Use tape or a leg strap provided by the hospital.  Empty the drainage bag when it is 1/2 to 3/4 full. Wash your hands before and after touching the bag.  Periodically check the tubing for kinks to make sure there is no pressure on the tubing which could restrict the flow of urine.  Changing the Drainage Bags Cleanse both ends of the clean bag with alcohol  before changing.  Pinch off the rubber catheter to  avoid urine spillage during the disconnection.  Disconnect the dirty bag and connect the clean one.  Empty the dirty bag carefully to avoid a urine spill.  Attach the new bag to the leg with tape or a leg strap.  Cleaning the Drainage Bags Whenever a drainage bag is disconnected, it must be cleaned quickly so it is ready for the next use.  Wash the bag in warm, soapy water .  Rinse the bag thoroughly with warm water .  Soak the bag for 30 minutes in a solution of white vinegar and water  (1 cup vinegar to 1 quart warm water ).  Rinse with warm water .  SEEK MEDICAL CARE IF:  You have chills or night sweats.  You are leaking around your catheter or have problems with your catheter. It is not uncommon to have sporadic leakage around your catheter as a result of bladder spasms. If the leakage stops, there is not much need for concern. If you are uncertain, call your caregiver.  You develop side effects that you think are coming from your medicines.  SEEK IMMEDIATE MEDICAL CARE IF:  You are suddenly unable to urinate. Check to see if there are any kinks in the drainage tubing that may cause this. If you cannot find any kinks, call your caregiver immediately. This is an emergency.  You develop shortness of breath or chest pains.  Bleeding persists or clots develop in your urine.  You have a fever.  You develop pain in your back or over your lower belly (abdomen).  You develop pain or swelling in your legs.  Any problems you are having get worse rather than better.  MAKE SURE YOU:  Understand these instructions.  Will watch your condition.  Will get help right away if you are not doing well or get worse.     Remove foley on Monday. Restart Xarelto  on Monday

## 2023-12-18 NOTE — Op Note (Signed)
 Preoperative diagnosis:  Urethral stricture Foreign body within urethra  Postoperative diagnosis:  Same  Procedure: Retrograde urethrogram with interpretation Laser hair removal Optilume urethral balloon dilation  Surgeon: Andrez Banker, MD  Anesthesia: General  Complications: None  Intraoperative findings:  #1: Patient's urethra gram demonstrated a normal prostatic and bulbar urethra with pan stricture within the anterior urethra with a fairly pronounced stricture at the distal urethra that was about 1 cm. # 2: There was numerous hair follicles within the segment of the urethra that I was able to laser. #3: Once all the hair follicles had been removed I used the Optilume urethral balloon dilator and dilated the urethra to 30 [place].  EBL: Minimal  Specimens: None  Indication: Nicholas Terry is a 73 y.o. patient with history of hypospadias is a newborn that was subsequently repaired with hairbearing skin.  He requires stone removal of the urethra every several years, and recently I took the patient to the OR to remove stones, and noted that he was completely obstructed from growth of the hair.  We attempted to laser the hair follicles, but there were too many and his urethra was tough to navigate.  Ultimately we got an open and patent, and placed a catheter.  Following this the developed difficulty voiding and recurrent urinary tract infections.  After reviewing the management options for treatment, he elected to proceed with the above surgical procedure(s). We have discussed the potential benefits and risks of the procedure, side effects of the proposed treatment, the likelihood of the patient achieving the goals of the procedure, and any potential problems that might occur during the procedure or recuperation. Informed consent has been obtained.  Description of procedure:  The patient was brought back to the operating room placed on the table in supine position.  General  esthesia was then induced and an endotracheal tube was inserted.  He was placed in dorsolithotomy position and prepped and draped in the routine sterile fashion.  Timeout subsequently performed.  I used a 14 French Foley catheter and inserted into the patient's urethral meatus and 1 cm more proximal inflating the balloon to the 3 mL.  I subsequently performed a retrograde urethrogram with the above findings using about 20 cc of Omnipaque contrast.  I then was able to advance a wire through the urethra and into the bladder.  I confirmed that was in the appropriate position by advancing an open-ended catheter over the wire, remove the wire and got clear urine.  I then repassed the wire and remove the open-ended catheter.  I then used a 26 [place] nonmedicated urethral dilating balloon that was 10 cm long and inflated it across the noted stricture.  I then used a 17 French cystoscope sheath in the 30 degree lens and gently passed through the patient's urethra with the above findings.  I advanced into the bladder and perform cystoscopy.  The bladder appeared normal.  I then slowly backed out the cystoscope and attempted to remove a lot of the hair follicles using the biopsy forceps, but this proved to be difficult.  I then exchanged the biopsy forceps for a 200 m laser fiber and with settings of 10 Hz and 0.3 J I lasered the hair follicles.  I was able to laser almost all of them.  There were possibly 2 or 3 that I could not see or reach.  I then advanced back into the patient's bladder and removed all excess hair that it blown back to the bladder.  I then advanced the Optilume balloon over the wire and positioned it so that it was across the stricture and inflated the balloon to 10 cc H2O for 5 minutes.  I subsequently took the balloon down and advanced a 14 French catheter over the wire successfully.  The patient was subsequently awoken and returned to the PACU in stable condition.  Disposition: The patient  will be discharged home with a Foley catheter and instructed to remove it on Monday morning.

## 2023-12-19 ENCOUNTER — Encounter (HOSPITAL_COMMUNITY): Payer: Self-pay | Admitting: Urology

## 2023-12-21 DIAGNOSIS — N35919 Unspecified urethral stricture, male, unspecified site: Secondary | ICD-10-CM | POA: Diagnosis not present

## 2023-12-21 DIAGNOSIS — N368 Other specified disorders of urethra: Secondary | ICD-10-CM | POA: Diagnosis not present

## 2024-01-08 DIAGNOSIS — H40013 Open angle with borderline findings, low risk, bilateral: Secondary | ICD-10-CM | POA: Diagnosis not present

## 2024-01-19 DIAGNOSIS — R3912 Poor urinary stream: Secondary | ICD-10-CM | POA: Diagnosis not present

## 2024-01-19 DIAGNOSIS — R351 Nocturia: Secondary | ICD-10-CM | POA: Diagnosis not present

## 2024-01-19 DIAGNOSIS — R350 Frequency of micturition: Secondary | ICD-10-CM | POA: Diagnosis not present

## 2024-01-19 DIAGNOSIS — N35011 Post-traumatic bulbous urethral stricture: Secondary | ICD-10-CM | POA: Diagnosis not present

## 2024-02-03 DIAGNOSIS — M1711 Unilateral primary osteoarthritis, right knee: Secondary | ICD-10-CM | POA: Diagnosis not present

## 2024-03-23 DIAGNOSIS — M810 Age-related osteoporosis without current pathological fracture: Secondary | ICD-10-CM | POA: Diagnosis not present

## 2024-03-23 DIAGNOSIS — Z0189 Encounter for other specified special examinations: Secondary | ICD-10-CM | POA: Diagnosis not present

## 2024-03-23 DIAGNOSIS — E785 Hyperlipidemia, unspecified: Secondary | ICD-10-CM | POA: Diagnosis not present

## 2024-03-29 ENCOUNTER — Other Ambulatory Visit: Payer: Self-pay | Admitting: Internal Medicine

## 2024-03-29 DIAGNOSIS — H8110 Benign paroxysmal vertigo, unspecified ear: Secondary | ICD-10-CM | POA: Diagnosis not present

## 2024-03-29 DIAGNOSIS — E785 Hyperlipidemia, unspecified: Secondary | ICD-10-CM | POA: Diagnosis not present

## 2024-03-29 DIAGNOSIS — R972 Elevated prostate specific antigen [PSA]: Secondary | ICD-10-CM | POA: Diagnosis not present

## 2024-03-29 DIAGNOSIS — M810 Age-related osteoporosis without current pathological fracture: Secondary | ICD-10-CM | POA: Diagnosis not present

## 2024-03-29 DIAGNOSIS — Z1331 Encounter for screening for depression: Secondary | ICD-10-CM | POA: Diagnosis not present

## 2024-03-29 DIAGNOSIS — Z86718 Personal history of other venous thrombosis and embolism: Secondary | ICD-10-CM | POA: Diagnosis not present

## 2024-03-29 DIAGNOSIS — M545 Low back pain, unspecified: Secondary | ICD-10-CM | POA: Diagnosis not present

## 2024-03-29 DIAGNOSIS — Z0000 Encounter for general adult medical examination without abnormal findings: Secondary | ICD-10-CM | POA: Diagnosis not present

## 2024-03-29 DIAGNOSIS — Z79899 Other long term (current) drug therapy: Secondary | ICD-10-CM | POA: Diagnosis not present

## 2024-03-29 DIAGNOSIS — Z23 Encounter for immunization: Secondary | ICD-10-CM | POA: Diagnosis not present

## 2024-04-06 ENCOUNTER — Ambulatory Visit
Admission: RE | Admit: 2024-04-06 | Discharge: 2024-04-06 | Disposition: A | Discharge status: Home / Self Care | Source: Ambulatory Visit | Attending: Internal Medicine | Admitting: Internal Medicine

## 2024-04-06 DIAGNOSIS — Z136 Encounter for screening for cardiovascular disorders: Secondary | ICD-10-CM | POA: Diagnosis not present

## 2024-04-06 DIAGNOSIS — E785 Hyperlipidemia, unspecified: Secondary | ICD-10-CM

## 2024-04-06 DIAGNOSIS — I2510 Atherosclerotic heart disease of native coronary artery without angina pectoris: Secondary | ICD-10-CM | POA: Diagnosis not present

## 2024-04-12 DIAGNOSIS — R42 Dizziness and giddiness: Secondary | ICD-10-CM | POA: Diagnosis not present

## 2024-04-12 DIAGNOSIS — M530 Cervicocranial syndrome: Secondary | ICD-10-CM | POA: Diagnosis not present

## 2024-04-19 DIAGNOSIS — M530 Cervicocranial syndrome: Secondary | ICD-10-CM | POA: Diagnosis not present

## 2024-04-19 DIAGNOSIS — R42 Dizziness and giddiness: Secondary | ICD-10-CM | POA: Diagnosis not present

## 2024-04-21 DIAGNOSIS — R350 Frequency of micturition: Secondary | ICD-10-CM | POA: Diagnosis not present

## 2024-04-26 DIAGNOSIS — N35819 Other urethral stricture, male, unspecified site: Secondary | ICD-10-CM | POA: Diagnosis not present

## 2024-04-26 DIAGNOSIS — R399 Unspecified symptoms and signs involving the genitourinary system: Secondary | ICD-10-CM | POA: Diagnosis not present

## 2024-04-26 DIAGNOSIS — R42 Dizziness and giddiness: Secondary | ICD-10-CM | POA: Diagnosis not present

## 2024-04-26 DIAGNOSIS — M530 Cervicocranial syndrome: Secondary | ICD-10-CM | POA: Diagnosis not present

## 2024-04-26 DIAGNOSIS — Z87440 Personal history of urinary (tract) infections: Secondary | ICD-10-CM | POA: Diagnosis not present

## 2024-04-26 DIAGNOSIS — Z125 Encounter for screening for malignant neoplasm of prostate: Secondary | ICD-10-CM | POA: Diagnosis not present

## 2024-05-03 DIAGNOSIS — M530 Cervicocranial syndrome: Secondary | ICD-10-CM | POA: Diagnosis not present

## 2024-05-03 DIAGNOSIS — R42 Dizziness and giddiness: Secondary | ICD-10-CM | POA: Diagnosis not present

## 2024-05-10 DIAGNOSIS — M530 Cervicocranial syndrome: Secondary | ICD-10-CM | POA: Diagnosis not present

## 2024-05-11 DIAGNOSIS — M25561 Pain in right knee: Secondary | ICD-10-CM | POA: Diagnosis not present

## 2024-05-17 DIAGNOSIS — M530 Cervicocranial syndrome: Secondary | ICD-10-CM | POA: Diagnosis not present

## 2024-05-17 DIAGNOSIS — R42 Dizziness and giddiness: Secondary | ICD-10-CM | POA: Diagnosis not present

## 2024-06-06 NOTE — Patient Instructions (Signed)
 SURGICAL WAITING ROOM VISITATION  Patients having surgery or a procedure may have no more than 2 support people in the waiting area - these visitors may rotate.    Children under the age of 78 must have an adult with them who is not the patient.  Visitors with respiratory illnesses are discouraged from visiting and should remain at home.  If the patient needs to stay at the hospital during part of their recovery, the visitor guidelines for inpatient rooms apply. Pre-op nurse will coordinate an appropriate time for 1 support person to accompany patient in pre-op.  This support person may not rotate.    Please refer to the [facility] website for the visitor guidelines for Inpatients (after your surgery is over and you are in a regular room).    Your procedure is scheduled on: 06/14/24   Report to [facility] Main Entrance    Report to admitting at 7:25 AM   Call this number if you have problems the morning of surgery [phone]   Do not eat food :After Midnight.   After Midnight you may have the following liquids until 6:55 AM DAY OF SURGERY  Water  Non-Citrus Juices (without pulp, NO RED-Apple, White grape, White cranberry) Black Coffee (NO MILK/CREAM OR CREAMERS, sugar ok)  Clear Tea (NO MILK/CREAM OR CREAMERS, sugar ok) regular and decaf                             Plain Jell-O (NO RED)                                           Fruit ices (not with fruit pulp, NO RED)                                     Popsicles (NO RED)                                                               Sports drinks like Gatorade (NO RED)               The day of surgery:  Drink ONE (1) Pre-Surgery Clear Ensure at 6:55 AM the morning of surgery. Drink in one sitting. Do not sip.  This drink was given to you during your hospital  pre-op appointment visit. Nothing else to drink after completing the  Pre-Surgery Clear Ensure.          If you have questions, please contact your surgeon's  office.   FOLLOW BOWEL PREP AND ANY ADDITIONAL PRE OP INSTRUCTIONS YOU RECEIVED FROM YOUR SURGEON'S OFFICE!!!     Oral Hygiene is also important to reduce your risk of infection.                                    Remember - BRUSH YOUR TEETH THE MORNING OF SURGERY WITH YOUR REGULAR TOOTHPASTE  DENTURES WILL BE REMOVED PRIOR TO SURGERY PLEASE DO NOT APPLY Poly grip OR ADHESIVES!!!  Stop all vitamins and herbal supplements 7 days before surgery.   Take these medicines the morning of surgery with A SIP OF WATER : Tylenol , Claritin                                You may not have any metal on your body including jewelry, and body piercing             Do not wear lotions, powders, cologne, or deodorant              Men may shave face and neck.   Do not bring valuables to the hospital. [facility] IS NOT             RESPONSIBLE   FOR VALUABLES.   Contacts, glasses, dentures or bridgework may not be worn into surgery.   Bring small overnight bag day of surgery.   DO NOT BRING YOUR HOME MEDICATIONS TO THE HOSPITAL. PHARMACY WILL DISPENSE MEDICATIONS LISTED ON YOUR MEDICATION LIST TO YOU DURING YOUR ADMISSION IN THE HOSPITAL!   Special Instructions: Bring a copy of your healthcare power of attorney and living will documents the day of surgery if you haven't scanned them before.              Please read over the following fact sheets you were given: IF YOU HAVE QUESTIONS ABOUT YOUR PRE-OP INSTRUCTIONS PLEASE CALL [phone].   If you received a COVID test during your pre-op visit  it is requested that you wear a mask when out in public, stay away from anyone that may not be feeling well and notify your surgeon if you develop symptoms. If you test positive for Covid or have been in contact with anyone that has tested positive in the last 10 days please notify you surgeon.      Pre-operative 4 CHG Bath Instructions  DYNA-Hex 4 Chlorhexidine  Gluconate 4% Solution Antiseptic 4  fl. oz   You can play a key role in reducing the risk of infection after surgery. Your skin needs to be as free of germs as possible. You can reduce the number of germs on your skin by washing with CHG (chlorhexidine  gluconate) soap before surgery. CHG is an antiseptic soap that kills germs and continues to kill germs even after washing.   DO NOT use if you have an allergy  to chlorhexidine /CHG or antibacterial soaps. If your skin becomes reddened or irritated, stop using the CHG and notify one of our RNs at   Please shower with the CHG soap starting 4 days before surgery using the following schedule:     Please keep in mind the following:  DO NOT shave, including legs and underarms, starting the day of your first shower.   You may shave your face at any point before/day of surgery.  Place clean sheets on your bed the day you start using CHG soap. Use a clean washcloth (not used since being washed) for each shower. DO NOT sleep with pets once you start using the CHG.  CHG Shower Instructions:  If you choose to wash your hair and private area, wash first with your normal shampoo/soap.  After you use shampoo/soap, rinse your hair and body thoroughly to remove shampoo/soap residue.  Turn the water  OFF and apply about 3 tablespoons (45 ml) of CHG soap to a CLEAN washcloth.  Apply CHG soap ONLY FROM YOUR NECK DOWN TO YOUR TOES (washing for 3-5 minutes)  DO NOT use CHG soap on face, private areas, open wounds, or sores.  Pay special attention to the area where your surgery is being performed.  If you are having back surgery, having someone wash your back for you may be helpful. Wait 2 minutes after CHG soap is applied, then you may rinse off the CHG soap.  Pat dry with a clean towel  Put on clean clothes/pajamas   If you choose to wear lotion, please use ONLY the CHG-compatible lotions on the back of this paper.     Additional instructions for the day of surgery: DO NOT APPLY any lotions,  deodorants, cologne, or perfumes.   Put on clean/comfortable clothes.  Brush your teeth.  Ask your nurse before applying any prescription medications to the skin.   CHG Compatible Lotions   Aveeno Moisturizing lotion  Cetaphil Moisturizing Cream  Cetaphil Moisturizing Lotion  Clairol Herbal Essence Moisturizing Lotion, Dry Skin  Clairol Herbal Essence Moisturizing Lotion, Extra Dry Skin  Clairol Herbal Essence Moisturizing Lotion, Normal Skin  Curel Age Defying Therapeutic Moisturizing Lotion with Alpha Hydroxy  Curel Extreme Care Body Lotion  Curel Soothing Hands Moisturizing Hand Lotion  Curel Therapeutic Moisturizing Cream, Fragrance-Free  Curel Therapeutic Moisturizing Lotion, Fragrance-Free  Curel Therapeutic Moisturizing Lotion, Original Formula  Eucerin Daily Replenishing Lotion  Eucerin Dry Skin Therapy Plus Alpha Hydroxy Crme  Eucerin Dry Skin Therapy Plus Alpha Hydroxy Lotion  Eucerin Original Crme  Eucerin Original Lotion  Eucerin Plus Crme Eucerin Plus Lotion  Eucerin TriLipid Replenishing Lotion  Keri Anti-Bacterial Hand Lotion  Keri Deep Conditioning Original Lotion Dry Skin Formula Softly Scented  Keri Deep Conditioning Original Lotion, Fragrance Free Sensitive Skin Formula  Keri Lotion Fast Absorbing Fragrance Free Sensitive Skin Formula  Keri Lotion Fast Absorbing Softly Scented Dry Skin Formula  Keri Original Lotion  Keri Skin Renewal Lotion Keri Silky Smooth Lotion  Keri Silky Smooth Sensitive Skin Lotion  Nivea Body Creamy Conditioning Oil  Nivea Body Extra Enriched Lotion  Nivea Body Original Lotion  Nivea Body Sheer Moisturizing Lotion Nivea Crme  Nivea Skin Firming Lotion  NutraDerm 30 Skin Lotion  NutraDerm Skin Lotion  NutraDerm Therapeutic Skin Cream  NutraDerm Therapeutic Skin Lotion  ProShield Protective Hand Cream  Provon moisturizing lotionView   Pre-Surgery Education  Videos:  [email or web address]     Incentive Spirometer  An incentive spirometer is a tool that can help keep your lungs clear and active. This tool measures how well you are filling your lungs with each breath. Taking long deep breaths may help reverse or decrease the chance of developing breathing (pulmonary) problems (especially infection) following: A long period of time when you are unable to move or be active. BEFORE THE PROCEDURE  If the spirometer includes an indicator to show your best effort, your nurse or respiratory therapist will set it to a desired goal. If possible, sit up straight or lean slightly forward. Try not to slouch. Hold the incentive spirometer in an upright position. INSTRUCTIONS FOR USE  Sit on the edge of your bed if possible, or sit up as far as you can in bed or on a chair. Hold the incentive spirometer in an upright position. Breathe out normally. Place the mouthpiece in your mouth and seal your lips tightly around it. Breathe in slowly and as deeply as possible, raising the piston or the ball toward the top of the column. Hold your breath for 3-5 seconds or for as long as possible. Allow the piston or ball to  fall to the bottom of the column. Remove the mouthpiece from your mouth and breathe out normally. Rest for a few seconds and repeat Steps 1 through 7 at least 10 times every 1-2 hours when you are awake. Take your time and take a few normal breaths between deep breaths. The spirometer may include an indicator to show your best effort. Use the indicator as a goal to work toward during each repetition. After each set of 10 deep breaths, practice coughing to be sure your lungs are clear. If you have an incision (the cut made at the time of surgery), support your incision when coughing by placing a pillow or rolled up towels firmly against it. Once you are able to get out of bed, walk around indoors  and cough well. You may stop using the incentive spirometer when instructed by your caregiver.  RISKS AND COMPLICATIONS Take your time so you do not get dizzy or light-headed. If you are in pain, you may need to take or ask for pain medication before doing incentive spirometry. It is harder to take a deep breath if you are having pain. AFTER USE Rest and breathe slowly and easily. It can be helpful to keep track of a log of your progress. Your caregiver can provide you with a simple table to help with this. If you are using the spirometer at home, follow these instructions: SEEK MEDICAL CARE IF:  You are having difficultly using the spirometer. You have trouble using the spirometer as often as instructed. Your pain medication is not giving enough relief while using the spirometer. You develop fever of 100.5 F (38.1 C) or higher. SEEK IMMEDIATE MEDICAL CARE IF:  You cough up bloody sputum that had not been present before. You develop fever of 102 F (38.9 C) or greater. You develop worsening pain at or near the incision site. MAKE SURE YOU:  Understand these instructions. Will watch your condition. Will get help right away if you are not doing well or get worse. Document Released: 11/17/2006 Document Revised: 09/29/2011 Document Reviewed: 01/18/2007 [facility] Patient Information 2014 [place], [place].   ________________________________________________________________________

## 2024-06-06 NOTE — Progress Notes (Signed)
 Date of COVID positive in last 90 days:  PCP - Massie Sewer, DO Cardiologist - n/a  Chest x-ray - [facility] EKG - [facility] Stress Test - N/A ECHO - N/A Cardiac Cath - N/A Pacemaker/ICD device last checked:N/A Spinal Cord Stimulator:N/A  Bowel Prep - N/A  Sleep Study - N/A CPAP -   Fasting Blood Sugar - N/A Checks Blood Sugar _____ times a day  Last dose of GLP1 agonist-  N/A GLP1 instructions:  Do not take after     Last dose of SGLT-2 inhibitors-  N/A SGLT-2 instructions:  Do not take after     Blood Thinner Instructions: Xarelto , hold 3 days Aspirin Instructions:N/A Last Dose: 06/10/24 0900  Activity level: Can go up a flight of stairs and perform activities of daily living without stopping and without symptoms of chest pain or shortness of breath.  Anesthesia review: DVT  Patient denies shortness of breath, fever, cough and chest pain at PAT appointment  Patient verbalized understanding of instructions that were given to them at the PAT appointment. Patient was also instructed that they will need to review over the PAT instructions again at home before surgery.

## 2024-06-07 ENCOUNTER — Other Ambulatory Visit: Payer: Self-pay

## 2024-06-07 ENCOUNTER — Encounter (HOSPITAL_COMMUNITY): Payer: Self-pay

## 2024-06-07 ENCOUNTER — Encounter (HOSPITAL_COMMUNITY)
Admission: RE | Admit: 2024-06-07 | Discharge: 2024-06-07 | Disposition: A | Source: Ambulatory Visit | Attending: Orthopedic Surgery

## 2024-06-07 VITALS — BP 122/81 | HR 61 | Temp 97.8°F | Resp 12 | Ht 69.0 in | Wt 144.0 lb

## 2024-06-07 DIAGNOSIS — I2510 Atherosclerotic heart disease of native coronary artery without angina pectoris: Secondary | ICD-10-CM | POA: Insufficient documentation

## 2024-06-07 DIAGNOSIS — Z01812 Encounter for preprocedural laboratory examination: Secondary | ICD-10-CM | POA: Diagnosis not present

## 2024-06-07 DIAGNOSIS — Z01818 Encounter for other preprocedural examination: Secondary | ICD-10-CM

## 2024-06-07 LAB — CBC
HCT: 43.2 % (ref 39.0–52.0)
Hemoglobin: 14.5 g/dL (ref 13.0–17.0)
MCH: 33 pg (ref 26.0–34.0)
MCHC: 33.6 g/dL (ref 30.0–36.0)
MCV: 98.2 fL (ref 80.0–100.0)
Platelets: 214 K/uL (ref 150–400)
RBC: 4.4 MIL/uL (ref 4.22–5.81)
RDW: 12.8 % (ref 11.5–15.5)
WBC: 4.2 K/uL (ref 4.0–10.5)
nRBC: 0 % (ref 0.0–0.2)

## 2024-06-07 LAB — BASIC METABOLIC PANEL WITH GFR
Anion gap: 8 (ref 5–15)
BUN: 19 mg/dL (ref 8–23)
CO2: 28 mmol/L (ref 22–32)
Calcium: 9.9 mg/dL (ref 8.9–10.3)
Chloride: 102 mmol/L (ref 98–111)
Creatinine, Ser: 0.94 mg/dL (ref 0.61–1.24)
GFR, Estimated: >60 mL/min (ref >60)
Glucose, Bld: 102 mg/dL — ABNORMAL HIGH (ref 70–99)
Potassium: 4.5 mmol/L (ref 3.5–5.1)
Sodium: 139 mmol/L (ref 135–145)

## 2024-06-07 LAB — SURGICAL PCR SCREEN
MRSA, PCR: NEGATIVE
Staphylococcus aureus: NEGATIVE

## 2024-06-09 NOTE — H&P (Signed)
 TOTAL KNEE ADMISSION H&P  Patient is being admitted for right total knee arthroplasty.  Therapy Plans: outpatient therapy at [facility] Disposition: Home with wife Planned DVT Prophylaxis: Xarelto  10mg  daily DME needed: none PCP: Dr. Michelene - clearance received Urology: Dr. 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  HPI: Nicholas Terry, 73 y.o. male, has a history of pain and functional disability in the right knee due to arthritis and has failed non-surgical conservative treatments for greater than 12 weeks to includeNSAID's and/or analgesics, corticosteriod injections, and activity modification.  Onset of symptoms was gradual, starting 2 years ago with gradually worsening course since that time. The patient noted no past surgery on the right knee(s).  Patient currently rates pain in the right knee(s) at 8 out of 10 with activity. Patient has worsening of pain with activity and weight bearing and pain that interferes with activities of daily living.  Patient has evidence of joint space narrowing by imaging studies. There is no active infection.  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   Past Medical History:  Diagnosis Date   Adenomatous colon polyp    Arthritis    BPH (benign prostatic hyperplasia)    Complication of anesthesia    DVT (deep venous thrombosis) (HCC)    bil calf   ED (erectile dysfunction)    Foot mass, left    left plantar forefoot   History of colon polyps    History of kidney stones    Internal hemorrhoids     Internal hemorrhoids    Pneumonia    hx of    PONV (postoperative nausea and vomiting)    Seasonal allergies     Past Surgical History:  Procedure Laterality Date   BALLOON DILATION N/A 12/18/2023   Procedure: BALLOON DILATION;  Surgeon: Cam Morene [facility]N, MD;  Location: [facility];  Service: Urology;  Laterality: N/A;   COLONOSCOPY     CYSTOSCOPY WITH HOLMIUM LASER LITHOTRIPSY N/A 12/18/2023   Procedure: CYSTOSCOPY, WITH HOLMIUM LASER LITHOTRIPSY;  Surgeon: Cam Morene ORN, MD;  Location: [facility];  Service: Urology;  Laterality: N/A;  CYSTOSCOPY, LASER LITHOTRIPSY, RETROGRADE URETHROGRAM, POSSIBLE OPTILUME BALLOON DILATION, POSSIBLE SUPRAPUBIC TUBE PLACEMENT   EXCISIONAL HEMORRHOIDECTOMY     INSERTION OF SUPRAPUBIC CATHETER N/A 12/18/2023   Procedure: INSERTION, SUPRAPUBIC CATHETER;  Surgeon: Cam Morene ORN, MD;  Location: [facility];  Service: Urology;  Laterality: N/A;   JOINT REPLACEMENT Left    knee   KIDNEY STONE SURGERY     x5-6   KNEE ARTHROSCOPY Left    MASS EXCISION Left 03/11/2018   Procedure: Excisional biopsy of left forefoot mass;  Surgeon: Kit Rush, MD;  Location: [facility];  Service: Orthopedics;  Laterality: Left;  60 mins   SHOULDER ARTHROSCOPY Right    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   Procedure: TOTAL HIP ARTHROPLASTY ANTERIOR APPROACH;  Surgeon: Ernie Cough, MD;  Location: [facility];  Service: Orthopedics;  Laterality: Left;  70 mins   TOTAL SHOULDER ARTHROPLASTY Left 05/27/2018   Procedure:  LEFT TOTAL SHOULDER ARTHROPLASTY;  Surgeon: Melita Drivers, MD;  Location: [facility];  Service: Orthopedics;  Laterality: Left;    TOTAL SHOULDER ARTHROPLASTY Right 05/17/2020   Procedure: TOTAL SHOULDER ARTHROPLASTY;  Surgeon: Melita Drivers, MD;  Location: [facility];  Service: Orthopedics;  Laterality: Right;     No current facility-administered medications for this encounter.   Current Outpatient Medications  Medication Sig Dispense Refill  Last Dose/Taking   acetaminophen  (TYLENOL ) 500 MG tablet Take 500-1,000 mg by mouth in the morning.   Taking   CRANBERRY PO Take 1 tablet by mouth in the morning.   Taking   fluticasone (FLONASE) 50 MCG/ACT nasal spray Place 2 sprays into both nostrils daily as needed for allergies.   Taking As Needed   loratadine  (CLARITIN ) 10 MG tablet Take 10 mg by mouth in the morning.   Taking   Multiple Vitamin (MULTIVITAMIN WITH MINERALS) TABS tablet Take 1 tablet by mouth in the morning.   Taking   Probiotic Product (PROBIOTIC PO) Take 1 capsule by mouth in the morning.   Taking   sodium chloride  (OCEAN) 0.65 % SOLN nasal spray Place 1 spray into both nostrils as needed for congestion.   Taking As Needed   tamsulosin (FLOMAX) 0.4 MG CAPS capsule Take 0.4 mg by mouth at bedtime.   Taking   XARELTO  10 MG TABS tablet Take 10 mg by mouth in the morning.   Taking   Allergies  Allergen Reactions   Sulfa Antibiotics Rash    Social History   Tobacco Use   Smoking status: Never   Smokeless tobacco: Never  Substance Use Topics   Alcohol  use: Yes    Alcohol /week: 2.0 standard drinks of alcohol     Types: 2 Standard drinks or equivalent per week    Comment: Wine couple times a week    Family History  Problem Relation Age of Onset   Parkinson's disease Mother    Stroke Father    Dementia Father    Colon cancer Neg Hx    Esophageal cancer Neg Hx    Rectal cancer Neg Hx    Colon polyps Neg Hx    Stomach cancer Neg Hx      Review of Systems  Constitutional:  Negative for chills and fever.  Respiratory:  Negative for cough and shortness of breath.   Cardiovascular:  Negative for chest pain.  Gastrointestinal:  Negative for nausea and vomiting.  Musculoskeletal:  Positive for arthralgias.     Objective:  Physical Exam Right knee exam: No palpable effusion, warmth erythema Right knee genu varum with tenderness medially 5 degree flexion contracture with flexion over 110 degrees Normal  ipsilateral right hip exam without groin pain or referred pain Left knee exam: Well-healed surgical incision without palpable effusion, warmth erythema Full knee extension and flexion to 120 degrees  Vital signs in last 24 hours:    Labs:   Estimated body mass index is 21.27 kg/m as calculated from the following:   Height as of 06/07/24: 5' 9 (1.753 m).   Weight as of 06/07/24: 65.3 kg.   Imaging Review Plain radiographs demonstrate severe degenerative joint disease of the right knee(s). The overall alignment isneutral. The bone quality appears to be adequate for age and reported activity level.      Assessment/Plan:  End stage arthritis, right knee   The patient history, physical examination, clinical judgment of the provider and imaging studies are consistent with end stage degenerative joint disease of the right  knee(s) and total knee arthroplasty is deemed medically necessary. The treatment options including medical management, injection therapy arthroscopy and arthroplasty were discussed at length. The risks and benefits of total knee arthroplasty were presented and reviewed. The risks due to aseptic loosening, infection, stiffness, patella tracking problems, thromboembolic complications and other imponderables were discussed. The patient acknowledged the explanation, agreed to proceed with the plan and consent was signed. Patient is being admitted for inpatient treatment for surgery, pain control, PT, OT, prophylactic antibiotics, VTE prophylaxis, progressive ambulation and ADL's and discharge planning. The patient is planning to be discharged home.     Patient's anticipated LOS is less than 2 midnights, meeting these requirements: - Younger than 37 - Lives within 1 hour of care - Has a competent adult at home to recover with post-op recover - NO history of  - Chronic pain requiring opiods  - Diabetes  - Coronary Artery Disease  - Heart failure  - Heart attack  -  Stroke  - DVT/VTE  - Cardiac arrhythmia  - Respiratory Failure/COPD  - Renal failure  - Anemia  - Advanced Liver disease  Rosina Calin, PA-C Orthopedic Surgery [facility] [place] [phone]

## 2024-06-13 NOTE — Anesthesia Preprocedure Evaluation (Signed)
 Anesthesia Evaluation  Patient identified by MRN, date of birth, ID band Patient awake    Reviewed: Allergy  & Precautions, H&P , NPO status , Patient's Chart, lab work & pertinent test results  History of Anesthesia Complications (+) PONV and history of anesthetic complications  Airway Mallampati: II  TM Distance: >3 FB Neck ROM: Full    Dental no notable dental hx. (+) Teeth Intact, Dental Advisory Given   Pulmonary neg pulmonary ROS   Pulmonary exam normal breath sounds clear to auscultation       Cardiovascular Exercise Tolerance: Good + DVT  negative cardio ROS  Rhythm:Regular Rate:Normal     Neuro/Psych negative neurological ROS  negative psych ROS   GI/Hepatic negative GI ROS, Neg liver ROS,,,  Endo/Other  negative endocrine ROS    Renal/GU negative Renal ROS  negative genitourinary   Musculoskeletal  (+) Arthritis , Osteoarthritis,    Abdominal   Peds  Hematology negative hematology ROS (+)   Anesthesia Other Findings   Reproductive/Obstetrics negative OB ROS                              Anesthesia Physical Anesthesia Plan  ASA: 2  Anesthesia Plan: General   Post-op Pain Management: Regional block* and Ofirmev  IV (intra-op)*   Induction: Intravenous  PONV Risk Score and Plan: 4 or greater and Ondansetron , Dexamethasone , Propofol  infusion, TIVA and Scopolamine  patch - Pre-op  Airway Management Planned: Oral ETT  Additional Equipment:   Intra-op Plan:   Post-operative Plan: Extubation in OR  Informed Consent: I have reviewed the patients History and Physical, chart, labs and discussed the procedure including the risks, benefits and alternatives for the proposed anesthesia with the patient or authorized representative who has indicated his/her understanding and acceptance.     Dental advisory given  Plan Discussed with: CRNA  Anesthesia Plan Comments:           Anesthesia Quick Evaluation

## 2024-06-14 ENCOUNTER — Ambulatory Visit (HOSPITAL_COMMUNITY)
Admission: RE | Admit: 2024-06-14 | Discharge: 2024-06-14 | Disposition: A | Discharge status: Home / Self Care | Attending: Orthopedic Surgery | Admitting: Orthopedic Surgery

## 2024-06-14 ENCOUNTER — Ambulatory Visit (HOSPITAL_BASED_OUTPATIENT_CLINIC_OR_DEPARTMENT_OTHER): Payer: Self-pay | Admitting: Anesthesiology

## 2024-06-14 ENCOUNTER — Encounter (HOSPITAL_COMMUNITY): Admission: RE | Disposition: A | Payer: Self-pay | Source: Home / Self Care | Attending: Orthopedic Surgery

## 2024-06-14 ENCOUNTER — Encounter (HOSPITAL_COMMUNITY): Payer: Self-pay | Admitting: Orthopedic Surgery

## 2024-06-14 ENCOUNTER — Ambulatory Visit (HOSPITAL_COMMUNITY): Payer: Self-pay | Admitting: Physician Assistant

## 2024-06-14 ENCOUNTER — Other Ambulatory Visit: Payer: Self-pay

## 2024-06-14 DIAGNOSIS — M25761 Osteophyte, right knee: Secondary | ICD-10-CM | POA: Diagnosis not present

## 2024-06-14 DIAGNOSIS — Z96651 Presence of right artificial knee joint: Secondary | ICD-10-CM

## 2024-06-14 DIAGNOSIS — Z79899 Other long term (current) drug therapy: Secondary | ICD-10-CM | POA: Insufficient documentation

## 2024-06-14 DIAGNOSIS — M1711 Unilateral primary osteoarthritis, right knee: Secondary | ICD-10-CM

## 2024-06-14 DIAGNOSIS — G8918 Other acute postprocedural pain: Secondary | ICD-10-CM | POA: Diagnosis not present

## 2024-06-14 DIAGNOSIS — Z96612 Presence of left artificial shoulder joint: Secondary | ICD-10-CM | POA: Diagnosis not present

## 2024-06-14 DIAGNOSIS — Z96611 Presence of right artificial shoulder joint: Secondary | ICD-10-CM | POA: Insufficient documentation

## 2024-06-14 DIAGNOSIS — Z96642 Presence of left artificial hip joint: Secondary | ICD-10-CM | POA: Diagnosis not present

## 2024-06-14 HISTORY — PX: TOTAL KNEE ARTHROPLASTY: SHX125

## 2024-06-14 SURGERY — ARTHROPLASTY, KNEE, TOTAL
Anesthesia: General | Site: Knee | Laterality: Right

## 2024-06-14 MED ORDER — OXYCODONE HCL 5 MG PO TABS: 5.0000 mg | ORAL_TABLET | ORAL | 0 refills | Status: AC | PRN

## 2024-06-14 MED ORDER — FENTANYL CITRATE (PF) 50 MCG/ML IJ SOSY
25.0000 ug | PREFILLED_SYRINGE | INTRAMUSCULAR | Status: DC | PRN
  Administered 2024-06-14 (×3): 50 ug via INTRAVENOUS

## 2024-06-14 MED ORDER — KETOROLAC TROMETHAMINE 30 MG/ML IJ SOLN
INTRAMUSCULAR | Status: AC
  Filled 2024-06-14: qty 1

## 2024-06-14 MED ORDER — HYDROMORPHONE HCL 1 MG/ML IJ SOLN
INTRAMUSCULAR | Status: AC
  Filled 2024-06-14: qty 1

## 2024-06-14 MED ORDER — SCOPOLAMINE 1 MG/3DAYS TD PT72
MEDICATED_PATCH | TRANSDERMAL | Status: AC
  Filled 2024-06-14: qty 1

## 2024-06-14 MED ORDER — FENTANYL CITRATE (PF) 50 MCG/ML IJ SOSY
50.0000 ug | PREFILLED_SYRINGE | INTRAMUSCULAR | Status: DC | PRN
  Administered 2024-06-14: 50 ug via INTRAVENOUS
  Filled 2024-06-14: qty 2

## 2024-06-14 MED ORDER — BUPIVACAINE-EPINEPHRINE (PF) 0.25% -1:200000 IJ SOLN
INTRAMUSCULAR | Status: AC
  Filled 2024-06-14: qty 30

## 2024-06-14 MED ORDER — TRANEXAMIC ACID-NACL 1000-0.7 MG/100ML-% IV SOLN: 1000.0000 mg | Freq: Once | INTRAVENOUS | Status: DC

## 2024-06-14 MED ORDER — FENTANYL CITRATE (PF) 100 MCG/2ML IJ SOLN
INTRAMUSCULAR | Status: DC | PRN
Start: 2024-06-14 — End: 2024-06-14
  Administered 2024-06-14 (×4): 50 ug via INTRAVENOUS

## 2024-06-14 MED ORDER — CEFAZOLIN SODIUM-DEXTROSE 2-4 GM/100ML-% IV SOLN: 2.0000 g | Freq: Four times a day (QID) | INTRAVENOUS | Status: DC

## 2024-06-14 MED ORDER — SENNA 8.6 MG PO TABS: 1.0000 | ORAL_TABLET | Freq: Every day | ORAL | 0 refills | Status: AC

## 2024-06-14 MED ORDER — PROPOFOL 1000 MG/100ML IV EMUL
INTRAVENOUS | Status: AC
  Filled 2024-06-14: qty 100

## 2024-06-14 MED ORDER — LIDOCAINE HCL (PF) 2 % IJ SOLN
INTRAMUSCULAR | Status: AC
  Filled 2024-06-14: qty 5

## 2024-06-14 MED ORDER — FENTANYL CITRATE (PF) 50 MCG/ML IJ SOSY
PREFILLED_SYRINGE | INTRAMUSCULAR | Status: AC
  Filled 2024-06-14: qty 1

## 2024-06-14 MED ORDER — ROCURONIUM BROMIDE 10 MG/ML (PF) SYRINGE
PREFILLED_SYRINGE | INTRAVENOUS | Status: AC
  Filled 2024-06-14: qty 10

## 2024-06-14 MED ORDER — 0.9 % SODIUM CHLORIDE (POUR BTL) OPTIME
TOPICAL | Status: DC | PRN
  Administered 2024-06-14: 1000 mL

## 2024-06-14 MED ORDER — LACTATED RINGERS IV SOLN: INTRAVENOUS | Status: DC

## 2024-06-14 MED ORDER — SODIUM CHLORIDE (PF) 0.9 % IJ SOLN
INTRAMUSCULAR | Status: DC | PRN
  Administered 2024-06-14: 61 mL

## 2024-06-14 MED ORDER — ORAL CARE MOUTH RINSE: 15.0000 mL | Freq: Once | OROMUCOSAL | Status: AC

## 2024-06-14 MED ORDER — HYDROMORPHONE HCL 1 MG/ML IJ SOLN
0.2500 mg | INTRAMUSCULAR | Status: DC | PRN
  Administered 2024-06-14 (×2): 0.5 mg via INTRAVENOUS

## 2024-06-14 MED ORDER — BUPIVACAINE-EPINEPHRINE (PF) 0.5% -1:200000 IJ SOLN
INTRAMUSCULAR | Status: DC | PRN
  Administered 2024-06-14: 20 mL via PERINEURAL

## 2024-06-14 MED ORDER — MIDAZOLAM HCL (PF) 2 MG/2ML IJ SOLN: 1.0000 mg | INTRAMUSCULAR | Status: DC | PRN

## 2024-06-14 MED ORDER — POLYETHYLENE GLYCOL 3350 17 G PO PACK: 17.0000 g | PACK | Freq: Two times a day (BID) | ORAL | Status: AC

## 2024-06-14 MED ORDER — DEXAMETHASONE SOD PHOSPHATE PF 10 MG/ML IJ SOLN
8.0000 mg | Freq: Once | INTRAMUSCULAR | Status: AC
  Administered 2024-06-14: 8 mg via INTRAVENOUS

## 2024-06-14 MED ORDER — TRANEXAMIC ACID-NACL 1000-0.7 MG/100ML-% IV SOLN
1000.0000 mg | INTRAVENOUS | Status: AC
Start: 2024-06-14 — End: 2024-06-14
  Administered 2024-06-14: 1000 mg via INTRAVENOUS
  Filled 2024-06-14: qty 100

## 2024-06-14 MED ORDER — SCOPOLAMINE 1 MG/3DAYS TD PT72
1.0000 | MEDICATED_PATCH | Freq: Once | TRANSDERMAL | Status: DC
  Administered 2024-06-14: 1 mg via TRANSDERMAL

## 2024-06-14 MED ORDER — FENTANYL CITRATE (PF) 100 MCG/2ML IJ SOLN
INTRAMUSCULAR | Status: AC
  Filled 2024-06-14: qty 2

## 2024-06-14 MED ORDER — ONDANSETRON HCL 4 MG/2ML IJ SOLN
INTRAMUSCULAR | Status: AC
  Filled 2024-06-14: qty 2

## 2024-06-14 MED ORDER — CEFAZOLIN SODIUM-DEXTROSE 2-4 GM/100ML-% IV SOLN
2.0000 g | INTRAVENOUS | Status: AC
  Administered 2024-06-14: 2 g via INTRAVENOUS
  Filled 2024-06-14: qty 100

## 2024-06-14 MED ORDER — CHLORHEXIDINE GLUCONATE 0.12 % MT SOLN
15.0000 mL | Freq: Once | OROMUCOSAL | Status: AC
  Administered 2024-06-14: 15 mL via OROMUCOSAL

## 2024-06-14 MED ORDER — PROPOFOL 500 MG/50ML IV EMUL
INTRAVENOUS | Status: DC | PRN
  Administered 2024-06-14: 150 ug/kg/min via INTRAVENOUS

## 2024-06-14 MED ORDER — ONDANSETRON HCL 4 MG/2ML IJ SOLN
INTRAMUSCULAR | Status: DC | PRN
  Administered 2024-06-14: 4 mg via INTRAVENOUS

## 2024-06-14 MED ORDER — SODIUM CHLORIDE (PF) 0.9 % IJ SOLN
INTRAMUSCULAR | Status: AC
  Filled 2024-06-14: qty 50

## 2024-06-14 MED ORDER — PROPOFOL 500 MG/50ML IV EMUL
INTRAVENOUS | Status: AC
Start: 2024-06-14 — End: 2024-06-14
  Filled 2024-06-14: qty 50

## 2024-06-14 MED ORDER — SUGAMMADEX SODIUM 200 MG/2ML IV SOLN
INTRAVENOUS | Status: DC | PRN
  Administered 2024-06-14: 200 mg via INTRAVENOUS

## 2024-06-14 MED ORDER — LACTATED RINGERS IV BOLUS: 500.0000 mL | Freq: Once | INTRAVENOUS | Status: DC

## 2024-06-14 MED ORDER — LIDOCAINE HCL (PF) 2 % IJ SOLN
INTRAMUSCULAR | Status: DC | PRN
  Administered 2024-06-14: 60 mg via INTRADERMAL

## 2024-06-14 MED ORDER — PROPOFOL 10 MG/ML IV BOLUS
INTRAVENOUS | Status: DC | PRN
  Administered 2024-06-14: 120 mg via INTRAVENOUS

## 2024-06-14 MED ORDER — ROCURONIUM BROMIDE 100 MG/10ML IV SOLN
INTRAVENOUS | Status: DC | PRN
  Administered 2024-06-14: 50 mg via INTRAVENOUS

## 2024-06-14 MED ORDER — SUGAMMADEX SODIUM 200 MG/2ML IV SOLN
INTRAVENOUS | Status: AC
Start: 2024-06-14 — End: 2024-06-14
  Filled 2024-06-14: qty 2

## 2024-06-14 MED ORDER — CYCLOBENZAPRINE HCL 5 MG PO TABS: 5.0000 mg | ORAL_TABLET | Freq: Three times a day (TID) | ORAL | 2 refills | Status: AC | PRN

## 2024-06-14 MED ORDER — SODIUM CHLORIDE 0.9 % IR SOLN
Status: DC | PRN
Start: 2024-06-14 — End: 2024-06-14
  Administered 2024-06-14: 1000 mL

## 2024-06-14 MED ORDER — POVIDONE-IODINE 10 % EX SWAB
2.0000 | Freq: Once | CUTANEOUS | Status: AC
  Administered 2024-06-14: 2 via TOPICAL

## 2024-06-14 MED ORDER — FENTANYL CITRATE (PF) 50 MCG/ML IJ SOSY
PREFILLED_SYRINGE | INTRAMUSCULAR | Status: AC
  Filled 2024-06-14: qty 2

## 2024-06-14 SURGICAL SUPPLY — 45 items
ATTUNE MED ANAT PAT 38 KNEE (Knees) IMPLANT
BAG COUNTER SPONGE SURGICOUNT (BAG) IMPLANT
BAG ZIPLOCK 12X15 (MISCELLANEOUS) ×1 IMPLANT
BASEPLATE TIB CMT FB PCKT SZ6 (Knees) IMPLANT
BLADE SAW SGTL 11.0X1.19X90.0M (BLADE) IMPLANT
BLADE SAW SGTL 13.0X1.19X90.0M (BLADE) ×1 IMPLANT
BNDG ELASTIC 6INX 5YD STR LF (GAUZE/BANDAGES/DRESSINGS) ×1 IMPLANT
BOWL SMART MIX CTS (DISPOSABLE) ×1 IMPLANT
CEMENT HV SMART SET (Cement) ×2 IMPLANT
COMPONENT FEM CMT ATTN KN 5 RT (Joint) IMPLANT
COVER SURGICAL LIGHT HANDLE (MISCELLANEOUS) ×1 IMPLANT
CUFF TRNQT CYL 34X4.125X (TOURNIQUET CUFF) ×1 IMPLANT
DERMABOND ADVANCED .7 DNX12 (GAUZE/BANDAGES/DRESSINGS) ×1 IMPLANT
DRAPE U-SHAPE 47X51 STRL (DRAPES) ×1 IMPLANT
DRESSING AQUACEL AG SP 3.5X10 (GAUZE/BANDAGES/DRESSINGS) ×1 IMPLANT
DURAPREP 26ML APPLICATOR (WOUND CARE) ×2 IMPLANT
ELECT REM PT RETURN 15FT ADLT (MISCELLANEOUS) ×1 IMPLANT
GLOVE BIO SURGEON STRL SZ 6 (GLOVE) ×1 IMPLANT
GLOVE BIOGEL PI IND STRL 6.5 (GLOVE) ×1 IMPLANT
GLOVE BIOGEL PI IND STRL 7.5 (GLOVE) ×1 IMPLANT
GLOVE ORTHO TXT STRL SZ7.5 (GLOVE) ×2 IMPLANT
GOWN STRL REUS W/ TWL LRG LVL3 (GOWN DISPOSABLE) ×2 IMPLANT
HOLDER FOLEY CATH W/STRAP (MISCELLANEOUS) IMPLANT
INSERT MED ATTUNE KNEE 5 7 RT (Insert) IMPLANT
KIT TURNOVER KIT A (KITS) ×1 IMPLANT
MANIFOLD NEPTUNE II (INSTRUMENTS) ×1 IMPLANT
NDL SAFETY ECLIPSE 18X1.5 (NEEDLE) IMPLANT
NS IRRIG 1000ML POUR BTL (IV SOLUTION) ×1 IMPLANT
PACK TOTAL KNEE CUSTOM (KITS) ×1 IMPLANT
PENCIL SMOKE EVACUATOR (MISCELLANEOUS) ×1 IMPLANT
PIN FIX SIGMA LCS THRD HI (PIN) IMPLANT
PROTECTOR NERVE ULNAR (MISCELLANEOUS) ×1 IMPLANT
SET HNDPC FAN SPRY TIP SCT (DISPOSABLE) ×1 IMPLANT
SET PAD KNEE POSITIONER (MISCELLANEOUS) ×1 IMPLANT
SPIKE FLUID TRANSFER (MISCELLANEOUS) ×2 IMPLANT
SUT MNCRL AB 4-0 PS2 18 (SUTURE) ×1 IMPLANT
SUT STRATAFIX PDS+ 0 24IN (SUTURE) ×1 IMPLANT
SUT VIC AB 1 CT1 36 (SUTURE) ×1 IMPLANT
SUT VIC AB 2-0 CT1 TAPERPNT 27 (SUTURE) ×2 IMPLANT
SYR 3ML LL SCALE MARK (SYRINGE) ×1 IMPLANT
TOWEL GREEN STERILE FF (TOWEL DISPOSABLE) ×1 IMPLANT
TRAY FOLEY MTR SLVR 16FR STAT (SET/KITS/TRAYS/PACK) ×1 IMPLANT
TUBE SUCTION HIGH CAP CLEAR NV (SUCTIONS) ×1 IMPLANT
WATER STERILE IRR 1000ML POUR (IV SOLUTION) ×2 IMPLANT
WRAP KNEE MAXI GEL POST OP (GAUZE/BANDAGES/DRESSINGS) ×1 IMPLANT

## 2024-06-14 NOTE — Anesthesia Postprocedure Evaluation (Signed)
 Anesthesia Post Note  [name]  Procedure(s) Performed: ARTHROPLASTY, KNEE, TOTAL (Right: Knee)     Patient location during evaluation: PACU Anesthesia Type: General and Regional Level of consciousness: awake and alert Pain management: pain level controlled Vital Signs Assessment: post-procedure vital signs reviewed and stable Respiratory status: spontaneous breathing, nonlabored ventilation and respiratory function stable Cardiovascular status: blood pressure returned to baseline and stable Postop Assessment: no apparent nausea or vomiting Anesthetic complications: no   No notable events documented.  Last Vitals:  Vitals:   06/14/24 1300 06/14/24 1308  BP: 117/81 131/70  Pulse: 61 67  Resp: 11 12  Temp:  36.8 C  SpO2: 100% 97%    Last Pain:  Vitals:   06/14/24 1308  TempSrc:   PainSc: 2                  Ponce Skillman,W. EDMOND

## 2024-06-14 NOTE — Interval H&P Note (Signed)
 History and Physical Interval Note:  06/14/2024 8:45 AM  Nicholas Terry  has presented today for surgery, with the diagnosis of Right knee osteoarthritis.  The various methods of treatment have been discussed with the patient and family. After consideration of risks, benefits and other options for treatment, the patient has consented to  Procedure(s): ARTHROPLASTY, KNEE, TOTAL (Right) as a surgical intervention.  The patient's history has been reviewed, patient examined, no change in status, stable for surgery.  I have reviewed the patient's chart and labs.  Questions were answered to the patient's satisfaction.     [name]

## 2024-06-14 NOTE — Care Plan (Signed)
 Ortho [facility] Case Management Note  Patient Details  Name: Nicholas Terry MRN: [id number] Date of Birth: Dec 21, 1950  RT TKA on 06/14/24  DCP: Home with wife  DME: No needs; has RW and cane  PT: [facility]                   DME Arranged:  N/A DME Agency:  NA  HH Arranged:    HH Agency:     Additional Comments: Please contact me with any questions of if this plan should need to change.  Burnard Dross, Case Manager [facility] [phone]  Ext. [phone]   06/14/2024, 10:20 AM

## 2024-06-14 NOTE — Anesthesia Procedure Notes (Signed)
 Procedure Name: Intubation Date/Time: 06/14/2024 9:54 AM  Performed by: Augusta Daved SAILOR, CRNAPre-anesthesia Checklist: Patient identified, Emergency Drugs available, Suction available and Patient being monitored Patient Re-evaluated:Patient Re-evaluated prior to induction Oxygen Delivery Method: Circle system utilized Preoxygenation: Pre-oxygenation with 100% oxygen Induction Type: IV induction Ventilation: Mask ventilation without difficulty and Oral airway inserted - appropriate to patient size Laryngoscope Size: Glidescope and 3 Grade View: Grade I Tube type: Oral Tube size: 7.5 mm Number of attempts: 1 Airway Equipment and Method: Stylet and Oral airway Placement Confirmation: ETT inserted through vocal cords under direct vision, positive ETCO2 and breath sounds checked- equal and bilateral Secured at: 22 (at the lip) cm Tube secured with: Tape Dental Injury: Teeth and Oropharynx as per pre-operative assessment

## 2024-06-14 NOTE — Transfer of Care (Signed)
 Immediate Anesthesia Transfer of Care Note  [name]  Procedure(s) Performed: ARTHROPLASTY, KNEE, TOTAL (Right: Knee)  Patient Location: PACU  Anesthesia Type:General  Level of Consciousness: oriented, drowsy, and patient cooperative  Airway & Oxygen Therapy: Patient Spontanous Breathing and Patient connected to face mask oxygen  Post-op Assessment: Report given to RN and Post -op Vital signs reviewed and stable  Post vital signs: Reviewed and stable  Last Vitals:  Vitals Value Taken Time  BP 125/67 06/14/24 11:24  Temp    Pulse 79 06/14/24 11:26  Resp 12 06/14/24 11:26  SpO2 100 % 06/14/24 11:26  Vitals shown include unfiled device data.  Last Pain:  Vitals:   06/14/24 0920  TempSrc:   PainSc: 0-No pain      Patients Stated Pain Goal: 4 (06/14/24 9187)  Complications: No notable events documented.

## 2024-06-14 NOTE — Anesthesia Procedure Notes (Signed)
 Anesthesia Regional Block: Adductor canal block   Pre-Anesthetic Checklist: , timeout performed,  Correct Patient, Correct Site, Correct Laterality,  Correct Procedure, Correct Position, site marked,  Risks and benefits discussed,  Pre-op evaluation,  At surgeon's request and post-op pain management  Laterality: Right  Prep: Maximum Sterile Barrier Precautions used, chloraprep       Needles:  Injection technique: Single-shot  Needle Type: Echogenic Stimulator Needle     Needle Length: 9cm  Needle Gauge: 21     Additional Needles:   Procedures:,,,, ultrasound used (permanent image in chart),,    Narrative:  Start time: 06/14/2024 9:01 AM End time: 06/14/2024 9:11 AM Injection made incrementally with aspirations every 5 mL.  Performed by: Personally  Anesthesiologist: Epifanio Fallow, MD

## 2024-06-14 NOTE — Op Note (Addendum)
 NAME:  Nicholas Terry                      MEDICAL RECORD NO.:  [id number]                             FACILITY:  [facility]      PHYSICIAN:  Donnice JONETTA. Ernie, M.D.  DATE OF BIRTH:  [birth year]      DATE OF PROCEDURE:  06/14/2024                                     OPERATIVE REPORT         PREOPERATIVE DIAGNOSIS:  Right knee osteoarthritis.      POSTOPERATIVE DIAGNOSIS:  Right knee osteoarthritis.      FINDINGS:  The patient was noted to have complete loss of cartilage and   bone-on-bone arthritis with associated osteophytes in the medial and patellofemoral compartments of   the knee with a significant synovitis and associated effusion.  The patient had failed months of conservative treatment including medications, injection therapy, activity modification.     PROCEDURE:  Right total knee replacement.      COMPONENTS USED:  DePuy Attune FB CR MS knee   system, a size 5 femur, 6 tibia, size 7 mm CR MS AOX insert, and 38 anatomic patellar   button.      SURGEON:  Donnice JONETTA. Ernie, M.D.      ASSISTANT:  Rosina Calin, PA-C.      ANESTHESIA:  Regional and Spinal.      SPECIMENS:  None.      COMPLICATION:  None.      DRAINS:  None.  EBL: <300 cc      TOURNIQUET TIME:  No tourniquet was used     The patient was stable to the recovery room.      INDICATION FOR PROCEDURE:  Nicholas Terry is a 73 y.o. male patient of   mine.  The patient had been seen, evaluated, and treated for months conservatively in the   office with medication, activity modification, and injections.  The patient had   radiographic changes of bone-on-bone arthritis with endplate sclerosis and osteophytes noted.  Based on the radiographic changes and failed conservative measures, the patient   decided to proceed with definitive treatment, total knee replacement.  Risks of infection, DVT, component failure, need for revision surgery, neurovascular injury were reviewed in the office setting.  The postop course  was reviewed stressing the efforts to maximize post-operative satisfaction and function.  Consent was obtained for benefit of pain   relief.      PROCEDURE IN DETAIL:  The patient was brought to the operative theater.   Once adequate anesthesia, preoperative antibiotics, 2 gm of Ancef ,1 gm of Tranexamic Acid , and 10 mg of Decadron  administered, the patient was positioned supine.  The  right lower extremity was prepped and draped in sterile fashion.  A time-   out was performed identifying the patient, planned procedure, and the appropriate extremity.      The right lower extremity was placed in the [facility] leg holder.  A midline incision was   made followed by median parapatellar arthrotomy.  Following initial   exposure, attention was first directed to the patella.  Precut   measurement was noted to be 25 mm.  I resected  down to 14 mm and used a   38 anatomic patellar button to restore patellar height as well as cover the cut surface.      The lug holes were drilled and a metal shim was placed to protect the   patella from retractors and saw blade during the procedure.      At this point, attention was now directed to the femur.  The femoral   canal was opened with a drill, irrigated to try to prevent fat emboli.  An   intramedullary rod was passed at 5 degrees valgus, 9 mm of bone was   resected off the distal femur.  Following this resection, the tibia was   subluxated anteriorly.  Using the extramedullary guide, 2 mm of bone was resected off   the proximal medial tibia.  We confirmed the gap would be   stable medially and laterally with a size 5 spacer block as well as confirmed that the tibial cut was perpendicular in the coronal plane, checking with an alignment rod.      Once this was done, I sized the femur to be a size 5 in the anterior-   posterior dimension, chose a standard component based on medial and   lateral dimension.  The size 5 rotation block was then pinned in    position anterior referenced using the C-clamp to set rotation.  The   anterior, posterior, and  chamfer cuts were made without difficulty nor   notching making certain that I was along the anterior cortex to help   with flexion gap stability.      The final femoral shim cut was made off the lateral aspect of distal femur.      At this point, the tibia was sized to be a size 6.  The size 6 tray was   then pinned in position through the medial third of the tubercle,   drilled, and keel punched.  Trial reduction was now carried with a 5 femur,  6 tibia, a size 7 mm CR insert, and the 38 anatomic patella botton.  The knee was brought to full extension with good flexion stability with the patella   tracking through the trochlea without application of pressure.  Given   all these findings the trial components removed.  Final components were   opened and cement was mixed.  The knee was irrigated with normal saline solution and pulse lavage.  The synovial lining was   then injected with 30 cc of 0.25% Marcaine  with epinephrine , 1 cc of Toradol  and 30 cc of NS for a total of 61 cc.     Final implants were then cemented onto cleaned and dried cut surfaces of bone with the knee brought to extension with a size 7 mm CR trial insert.      Once the cement had fully cured, excess cement was removed   throughout the knee.  I confirmed that I was satisfied with the range of   motion and stability, and the final size 7 mm CR MS AOX insert was chosen.  It was   placed into the knee.     No significant hemostasis was required at this point in the case.  The extensor mechanism was then reapproximated using #1 Vicryl and #1 Stratafix sutures with the knee   in flexion.  The   remaining wound was closed with 2-0 Vicryl and running 4-0 Monocryl.   The knee was cleaned, dried, dressed sterilely using Dermabond and  Aquacel dressing.  The patient was then   brought to recovery room in stable condition,  tolerating the procedure   well.   Please note that Physician Assistant, Rosina Calin, PA-C was present for the entirety of the case, and was utilized for pre-operative positioning, peri-operative retractor management, general facilitation of the procedure and for primary wound closure at the end of the case.              Donnice CORDOBA Ernie, M.D.    06/14/2024 8:45 AM

## 2024-06-14 NOTE — Discharge Instructions (Signed)

## 2024-06-14 NOTE — Evaluation (Signed)
 Physical Therapy Evaluation Patient Details Name: Nicholas Terry MRN: [id number] DOB: March 08, 1951 Today's Date: 06/14/2024  History of Present Illness  Patient is 73 yo male s/p R TKA on 06/14/24.  Pt with hx including but not limited to arthritis, DVT, L THA, Bil TSA, L TKA  Clinical Impression  Pt is s/p TKA resulting in the deficits listed below (see PT Problem List). At baseline, pt active, independent, and enjoys going to the gym.  He has DME and support at home with no steps to enter home.  PT with orders to assess for possible same day d/c.  Pt with good quad activation, ROM, and pain control.  He has some initial lightheadedness with standing but vitals stable and symptoms resolved with return to sitting and pt tolerating further stands and walking without difficulty.  He ambulated 4' with RW and CGA for safety.  Pt has been through multiple joint replacements and is familiar with provided HEP with plan to f/u with outpt PT next week. Pt demonstrates safe gait & transfers in order to return home from PT perspective once discharged by MD.  While in hospital, will continue to benefit from PT for skilled therapy to advance mobility and exercises.             If plan is discharge home, recommend the following: A little help with walking and/or transfers;A little help with bathing/dressing/bathroom;Assistance with cooking/housework;Help with stairs or ramp for entrance   Can travel by private vehicle        Equipment Recommendations None recommended by PT  Recommendations for Other Services       Functional Status Assessment Patient has had a recent decline in their functional status and demonstrates the ability to make significant improvements in function in a reasonable and predictable amount of time.     Precautions / Restrictions Precautions Precautions: Fall      Mobility  Bed Mobility Overal bed mobility: Needs Assistance Bed Mobility: Supine to Sit     Supine to  sit: Contact guard          Transfers Overall transfer level: Needs assistance Equipment used: Rolling walker (2 wheels) Transfers: Sit to/from Stand Sit to Stand: Contact guard assist           General transfer comment: STS x 3; cues for hand placement and R LE management    Ambulation/Gait Ambulation/Gait assistance: Contact guard assist Gait Distance (Feet): 80 Feet Assistive device: Rolling walker (2 wheels) Gait Pattern/deviations: Step-to pattern, Decreased stride length, Decreased weight shift to right Gait velocity: decreased but functional     General Gait Details: good RW proximity; steady gait  Stairs Stairs:  (no steps at home)          Wheelchair Mobility     Tilt Bed    Modified Rankin (Stroke Patients Only)       Balance Overall balance assessment: Needs assistance Sitting-balance support: No upper extremity supported Sitting balance-Leahy Scale: Good     Standing balance support: No upper extremity supported, Bilateral upper extremity supported Standing balance-Leahy Scale: Fair Standing balance comment: RW to ambulate; could stand for ADLs without UE support                             Pertinent Vitals/Pain Pain Assessment Pain Assessment: No/denies pain    Home Living Family/patient expects to be discharged to:: Private residence Living Arrangements: Spouse/significant other Available Help at Discharge: Family;Available 24 hours/day  Type of Home: House Home Access: Level entry       Home Layout: One level Home Equipment: Shower seat - built in;Educational Psychologist (2 wheels)      Prior Function Prior Level of Function : Independent/Modified Independent;Driving             Mobility Comments: Could ambulate in community without AD; enjoys going to the gym       Extremity/Trunk Assessment   Upper Extremity Assessment Upper Extremity Assessment: Overall WFL for tasks assessed    Lower Extremity  Assessment Lower Extremity Assessment: LLE deficits/detail;RLE deficits/detail RLE Deficits / Details: Expected post op changes; ROM knee 0 to 80 degrees; MMT: ankle 5/5, knee and hip 3/5 not further tested LLE Deficits / Details: ROM WFL; MMT 5/5    Cervical / Trunk Assessment Cervical / Trunk Assessment: Normal  Communication        Cognition Arousal: Alert Behavior During Therapy: WFL for tasks assessed/performed   PT - Cognitive impairments: No apparent impairments                                 Cueing       General Comments General comments (skin integrity, edema, etc.): Pt had some nausea and lightheadedness with initial stand so returned to sitting BP was 141/72.  Sat for 3-4 mins and stood again.  Reports feeling much better BP was 122/71 which is near his normal.  No further lightheadedness reported.    Exercises Total Joint Exercises Ankle Circles/Pumps: AROM, Both, 5 reps, Supine Quad Sets: AROM, Both, 5 reps, Supine Heel Slides: AROM, Right, Supine, 5 reps Hip ABduction/ADduction: AROM, Right, 5 reps, Supine Long Arc Quad: AROM, Right, 5 reps, Seated Knee Flexion: AROM, Right, 5 reps, Seated Goniometric ROM: R knee 0 to 80 degrees   Assessment/Plan    PT Assessment Patient needs continued PT services  PT Problem List Decreased strength;Decreased mobility;Decreased range of motion;Decreased activity tolerance;Decreased balance;Decreased knowledge of use of DME;Pain       PT Treatment Interventions DME instruction;Therapeutic exercise;Gait training;Stair training;Functional mobility training;Therapeutic activities;Patient/family education;Modalities;Balance training    PT Goals (Current goals can be found in the Care Plan section)  Acute Rehab PT Goals Patient Stated Goal: return home PT Goal Formulation: With patient/family Time For Goal Achievement: 06/28/24 Potential to Achieve Goals: Good    Frequency 7X/week     Co-evaluation                AM-PAC PT 6 Clicks Mobility  Outcome Measure Help needed turning from your back to your side while in a flat bed without using bedrails?: A Little Help needed moving from lying on your back to sitting on the side of a flat bed without using bedrails?: A Little Help needed moving to and from a bed to a chair (including a wheelchair)?: A Little Help needed standing up from a chair using your arms (e.g., wheelchair or bedside chair)?: A Little Help needed to walk in hospital room?: A Little Help needed climbing 3-5 steps with a railing? : A Little 6 Click Score: 18    End of Session Equipment Utilized During Treatment: Gait belt Activity Tolerance: Patient tolerated treatment well Patient left: in chair;with family/visitor present (PACU) Nurse Communication: Mobility status PT Visit Diagnosis: Other abnormalities of gait and mobility (R26.89);Muscle weakness (generalized) (M62.81)    Time: 8585-8553 PT Time Calculation (min) (ACUTE ONLY): 32 min   Charges:  PT Evaluation $PT Eval Low Complexity: 1 Low PT Treatments $Gait Training: 8-22 mins PT General Charges $$ ACUTE PT VISIT: 1 Visit         Benjiman, PT Acute Rehab [facility] [phone]   Benjiman VEAR Mulberry 06/14/2024, 3:06 PM

## 2024-06-15 ENCOUNTER — Encounter (HOSPITAL_COMMUNITY): Payer: Self-pay | Admitting: Orthopedic Surgery

## 2024-06-20 DIAGNOSIS — M25561 Pain in right knee: Secondary | ICD-10-CM | POA: Diagnosis not present

## 2024-06-22 DIAGNOSIS — M25561 Pain in right knee: Secondary | ICD-10-CM | POA: Diagnosis not present

## 2024-06-24 DIAGNOSIS — M25561 Pain in right knee: Secondary | ICD-10-CM | POA: Diagnosis not present

## 2024-06-27 DIAGNOSIS — M25561 Pain in right knee: Secondary | ICD-10-CM | POA: Diagnosis not present
